# Patient Record
Sex: Female | Born: 1940 | Race: White | Hispanic: No | Smoking: Never smoker
Health system: Southern US, Community
[De-identification: ages and names within clinical notes are randomized; demographics above are authoritative.]

## PROBLEM LIST (undated history)

## (undated) DIAGNOSIS — N184 Chronic kidney disease, stage 4 (severe): Secondary | ICD-10-CM

## (undated) DIAGNOSIS — I340 Nonrheumatic mitral (valve) insufficiency: Secondary | ICD-10-CM

## (undated) DIAGNOSIS — I10 Essential (primary) hypertension: Secondary | ICD-10-CM

## (undated) DIAGNOSIS — M1990 Unspecified osteoarthritis, unspecified site: Secondary | ICD-10-CM

## (undated) DIAGNOSIS — N189 Chronic kidney disease, unspecified: Secondary | ICD-10-CM

## (undated) DIAGNOSIS — E039 Hypothyroidism, unspecified: Secondary | ICD-10-CM

## (undated) HISTORY — DX: Hypothyroidism, unspecified: E03.9

## (undated) HISTORY — DX: Chronic kidney disease, unspecified: N18.9

## (undated) HISTORY — DX: Nonrheumatic mitral (valve) insufficiency: I34.0

## (undated) HISTORY — DX: Essential (primary) hypertension: I10

---

## 1984-05-13 HISTORY — PX: SEPTOPLASTY: SUR1290

## 1988-05-13 HISTORY — PX: TOTAL ABDOMINAL HYSTERECTOMY W/ BILATERAL SALPINGOOPHORECTOMY: SHX83

## 2001-03-03 ENCOUNTER — Other Ambulatory Visit: Admission: RE | Admit: 2001-03-03 | Discharge: 2001-03-03 | Payer: Self-pay | Admitting: Obstetrics and Gynecology

## 2001-07-16 ENCOUNTER — Ambulatory Visit (HOSPITAL_COMMUNITY): Admission: RE | Admit: 2001-07-16 | Discharge: 2001-07-16 | Payer: Self-pay | Admitting: Internal Medicine

## 2003-10-13 ENCOUNTER — Ambulatory Visit (HOSPITAL_COMMUNITY): Admission: RE | Admit: 2003-10-13 | Discharge: 2003-10-13 | Payer: Self-pay | Admitting: Internal Medicine

## 2005-01-24 ENCOUNTER — Ambulatory Visit (HOSPITAL_COMMUNITY): Admission: RE | Admit: 2005-01-24 | Discharge: 2005-01-24 | Payer: Self-pay | Admitting: Internal Medicine

## 2005-01-29 ENCOUNTER — Ambulatory Visit (HOSPITAL_COMMUNITY): Admission: RE | Admit: 2005-01-29 | Discharge: 2005-01-29 | Payer: Self-pay | Admitting: Internal Medicine

## 2008-03-24 ENCOUNTER — Ambulatory Visit (HOSPITAL_COMMUNITY): Admission: RE | Admit: 2008-03-24 | Discharge: 2008-03-24 | Payer: Self-pay | Admitting: Internal Medicine

## 2009-03-22 ENCOUNTER — Ambulatory Visit: Payer: Self-pay | Admitting: Cardiology

## 2009-03-22 DIAGNOSIS — I2789 Other specified pulmonary heart diseases: Secondary | ICD-10-CM | POA: Insufficient documentation

## 2009-03-22 DIAGNOSIS — R011 Cardiac murmur, unspecified: Secondary | ICD-10-CM | POA: Insufficient documentation

## 2009-03-22 DIAGNOSIS — I080 Rheumatic disorders of both mitral and aortic valves: Secondary | ICD-10-CM | POA: Insufficient documentation

## 2009-03-22 DIAGNOSIS — E039 Hypothyroidism, unspecified: Secondary | ICD-10-CM

## 2009-03-22 DIAGNOSIS — I10 Essential (primary) hypertension: Secondary | ICD-10-CM

## 2009-03-28 ENCOUNTER — Ambulatory Visit: Payer: Self-pay | Admitting: Cardiology

## 2009-03-28 ENCOUNTER — Encounter: Payer: Self-pay | Admitting: Cardiology

## 2009-03-28 ENCOUNTER — Ambulatory Visit (HOSPITAL_COMMUNITY): Admission: RE | Admit: 2009-03-28 | Discharge: 2009-03-28 | Payer: Self-pay | Admitting: Nurse Practitioner

## 2009-03-30 ENCOUNTER — Encounter (INDEPENDENT_AMBULATORY_CARE_PROVIDER_SITE_OTHER): Payer: Self-pay | Admitting: *Deleted

## 2009-12-02 ENCOUNTER — Encounter: Payer: Self-pay | Admitting: Orthopedic Surgery

## 2009-12-02 ENCOUNTER — Emergency Department (HOSPITAL_COMMUNITY): Admission: EM | Admit: 2009-12-02 | Discharge: 2009-12-02 | Payer: Self-pay | Admitting: Emergency Medicine

## 2009-12-03 ENCOUNTER — Emergency Department (HOSPITAL_COMMUNITY): Admission: EM | Admit: 2009-12-03 | Discharge: 2009-12-03 | Payer: Self-pay | Admitting: Emergency Medicine

## 2009-12-04 ENCOUNTER — Ambulatory Visit: Payer: Self-pay | Admitting: Orthopedic Surgery

## 2009-12-04 DIAGNOSIS — IMO0002 Reserved for concepts with insufficient information to code with codable children: Secondary | ICD-10-CM

## 2009-12-11 ENCOUNTER — Ambulatory Visit: Payer: Self-pay | Admitting: Orthopedic Surgery

## 2009-12-29 ENCOUNTER — Ambulatory Visit (HOSPITAL_COMMUNITY): Admission: RE | Admit: 2009-12-29 | Discharge: 2009-12-29 | Payer: Self-pay | Admitting: Internal Medicine

## 2010-01-09 ENCOUNTER — Encounter: Payer: Self-pay | Admitting: Internal Medicine

## 2010-01-10 ENCOUNTER — Ambulatory Visit (HOSPITAL_COMMUNITY): Admission: RE | Admit: 2010-01-10 | Discharge: 2010-01-10 | Payer: Self-pay | Admitting: Internal Medicine

## 2010-01-10 ENCOUNTER — Ambulatory Visit: Payer: Self-pay | Admitting: Internal Medicine

## 2010-01-16 ENCOUNTER — Telehealth (INDEPENDENT_AMBULATORY_CARE_PROVIDER_SITE_OTHER): Payer: Self-pay

## 2010-06-12 NOTE — Letter (Signed)
Summary: History form  History form   Imported By: Ruffin Pyo 12/06/2009 08:04:08  _____________________________________________________________________  External Attachment:    Type:   Image     Comment:   External Document

## 2010-06-12 NOTE — Assessment & Plan Note (Signed)
Summary: 1 WK RE-CK RT ARM/HUMANA/CAF   Visit Type:  Follow-up Referring Provider:  ap er Primary Provider:  Erma Pinto, PA-C ([place])  CC:  right arm pain.  History of Present Illness: a 70 year old female had a followup visit after a dog bite to her RIGHT forearm.  Current medication includes Augmentin.  She is doing much better.  She has no pain some mild swelling  She's made significant improvements on Augmentin  Allergies: No Known Drug Allergies  Physical Exam  Extremities:  the swelling has basically resolved she has full range of motion in her hand and wrist there is no lymphangitis or lymphadenopathy.  There is no redness or tenderness   Impression & Recommendations:  Problem # 1:  CELLULITIS AND ABSCESS OF UPPER ARM AND FOREARM (ICD-682.3) Assessment Improved  Orders: Est. Patient Level II [id number])  Patient Instructions: 1)  Please schedule a follow-up appointment as needed.

## 2010-06-12 NOTE — Assessment & Plan Note (Signed)
Summary: AP ER FOL/UP/RT FOREARM INJURY/DOG BITE 12/02/09/XR [facility]/HUMANA...   Vital Signs:  Patient profile:   70 year old female Height:      62 inches Weight:      142 pounds Pulse rate:   78 / minute Resp:     16 per minute  Visit Type:  new patient Referring Provider:  ap er Primary Provider:  Erma Pinto, PA-C [facility])  CC:  right forearm.  History of Present Illness: I saw Sandra Hunter in the office today for an initial visit.  She is a 70 years old woman with the complaint of:  right forearm pain after dog bite on 12/02/09.  Xrays negative of rt forearm on 12/02/09  Meds: Vitamin D, Thyroid.  Received IV Vanc and Unasyn in er.  She was bitten by a dog the dog is been properly cured for an tetanus and proper tears been done in the ER show a gram of Ancef gram of vancomycin took Augmentin 500 one twice a day and is taking ibuprofen 600 for pain she does have some sharp mild constant ache over the RIGHT forearm with some bruising and swelling.  Mild numbness noted as well.    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 No use of tobacco products  3 glasses of wine daily 5 cups of coffee daily ass. degree  Review of Systems Constitutional:  Denies weight loss, weight gain, fever, chills, and fatigue. Cardiovascular:  Denies chest pain, palpitations, fainting, and murmurs. Respiratory:  Complains of short of breath and snoring; denies wheezing, couch, tightness, pain on inspiration, and snoring . Gastrointestinal:  Denies heartburn, nausea, vomiting, diarrhea, constipation, and blood in your stools. Genitourinary:  Denies frequency, urgency, difficulty urinating, painful urination, flank pain, and bleeding in urine. Neurologic:  Denies numbness, tingling, unsteady gait, dizziness, tremors, and seizure. Musculoskeletal:  Denies joint pain,  swelling, instability, stiffness, redness, heat, and muscle pain. Endocrine:  Complains of exessive urination; denies excessive thirst and heat or cold intolerance. Psychiatric:  Complains of depression; denies nervousness, anxiety, and hallucinations. Skin:  Denies changes in the skin, poor healing, rash, itching, and redness. HEENT:  Denies blurred or double vision, eye pain, redness, and watering. Immunology:  Denies seasonal allergies, sinus problems, and allergic to bee stings. Hemoatologic:  Complains of brusing; denies easy bleeding.  Physical Exam  Msk:  The patient is well developed and nourished, with normal grooming and hygiene. The body habitus is  ectomorphic Pulses:  radial pulse Extremities:  she can move her fingers well she can move her wrist while she has some pain in the forearm and tenderness over the forearm elbow is normal. Neurologic:  sensation is normal Skin:  bruising ecchymosis is noted over the RIGHT forearm with multiple to proximal there is some swelling she is moving her fingers quite well there is no adenopathy Axillary Nodes:  no significant adenopathy Psych:  alert and cooperative; normal mood and affect; normal attention span and concentration   Impression & Recommendations:  Problem # 1:  CELLULITIS AND ABSCESS OF UPPER ARM AND FOREARM (ICD-682.3) Assessment New  dog bite RIGHT forearm seems to be doing well on Augmentin continue  The x-rays were done at [facility]. The report and the films have been reviewed.  Orders: New Patient Level II [id number])  Medications Added to Medication List This Visit: 1)  Augmentin 500-125 Mg Tabs (Amoxicillin-pot clavulanate) .Marland KitchenMarland KitchenMarland Kitchen 1  by mouth two times a day  Patient Instructions: 1)  F/U in 1 week 2)  continue antibiotics; elevation; and splinting  Prescriptions: AUGMENTIN 500-125 MG TABS (AMOXICILLIN-POT CLAVULANATE) 1 by mouth two times a day  #28 x 1   Entered and Authorized by:   Arther Abbott MD   Signed by:    Arther Abbott MD on 12/04/2009   Method used:   Print then Give to Patient   RxID:   [id number]

## 2010-06-12 NOTE — Progress Notes (Signed)
Summary: phone note/ ref to bulge in rectum/ref to Dr. Glo Herring  Phone Note Call from Patient   Caller: Patient Summary of Call: Pt called and said that Dr.Rourk referred her to Dr. Glo Herring for Consult for a bulge in her rectum, and she is having pain there. Her appt with Dr. Glo Herring is for Thursday this week. I asked her to call and see if she might be seen there a little earlier. She said she will do so, and will call back if she doesn't get help.  Initial call taken by: Waldon Merl LPN,  September  6, [id number] 8:38 AM

## 2010-06-12 NOTE — Letter (Signed)
Summary: TRIAGE SHEET  TRIAGE SHEET   Imported By: Hoy Morn 01/09/2010 08:49:09  _____________________________________________________________________  External Attachment:    Type:   Image     Comment:   External Document

## 2010-07-28 LAB — DIFFERENTIAL
Basophils Absolute: 0 10*3/uL (ref 0.0–0.1)
Basophils Relative: 1 % (ref 0–1)
Eosinophils Absolute: 0 10*3/uL (ref 0.0–0.7)
Eosinophils Absolute: 0.1 10*3/uL (ref 0.0–0.7)
Eosinophils Relative: 0 % (ref 0–5)
Lymphocytes Relative: 13 % (ref 12–46)
Lymphs Abs: 1.1 10*3/uL (ref 0.7–4.0)
Monocytes Absolute: 0.8 10*3/uL (ref 0.1–1.0)
Monocytes Absolute: 1 10*3/uL (ref 0.1–1.0)
Monocytes Relative: 12 % (ref 3–12)
Monocytes Relative: 9 % (ref 3–12)
Neutro Abs: 6.4 10*3/uL (ref 1.7–7.7)
Neutrophils Relative %: 74 % (ref 43–77)
Neutrophils Relative %: 76 % (ref 43–77)

## 2010-07-28 LAB — BASIC METABOLIC PANEL
Creatinine, Ser: 0.93 mg/dL (ref 0.4–1.2)
GFR calc Af Amer: >60 mL/min (ref >60)
Glucose, Bld: 86 mg/dL (ref 70–99)

## 2010-07-28 LAB — CBC
HCT: 36.9 % (ref 36.0–46.0)
Hemoglobin: 12.7 g/dL (ref 12.0–15.0)
Hemoglobin: 12.8 g/dL (ref 12.0–15.0)
MCH: 33.9 pg (ref 26.0–34.0)
MCHC: 34 g/dL (ref 30.0–36.0)
MCHC: 34.7 g/dL (ref 30.0–36.0)
MCV: 97.6 fL (ref 78.0–100.0)
RBC: 3.78 MIL/uL — ABNORMAL LOW (ref 3.87–5.11)
RBC: 3.78 MIL/uL — ABNORMAL LOW (ref 3.87–5.11)
WBC: 8.7 10*3/uL (ref 4.0–10.5)

## 2011-04-22 ENCOUNTER — Other Ambulatory Visit (HOSPITAL_COMMUNITY): Payer: Self-pay | Admitting: Internal Medicine

## 2011-04-22 DIAGNOSIS — Z139 Encounter for screening, unspecified: Secondary | ICD-10-CM

## 2011-04-25 ENCOUNTER — Ambulatory Visit (HOSPITAL_COMMUNITY)
Admission: RE | Admit: 2011-04-25 | Discharge: 2011-04-25 | Disposition: A | Discharge status: Home / Self Care | Payer: Medicare FFS | Source: Ambulatory Visit | Attending: Internal Medicine | Admitting: Internal Medicine

## 2011-04-25 DIAGNOSIS — Z139 Encounter for screening, unspecified: Secondary | ICD-10-CM

## 2011-04-25 DIAGNOSIS — Z1231 Encounter for screening mammogram for malignant neoplasm of breast: Secondary | ICD-10-CM | POA: Insufficient documentation

## 2011-05-10 ENCOUNTER — Encounter: Payer: Self-pay | Admitting: Cardiology

## 2012-03-17 ENCOUNTER — Other Ambulatory Visit (HOSPITAL_COMMUNITY): Payer: Self-pay | Admitting: Internal Medicine

## 2012-03-17 DIAGNOSIS — Z139 Encounter for screening, unspecified: Secondary | ICD-10-CM

## 2012-04-27 ENCOUNTER — Ambulatory Visit (HOSPITAL_COMMUNITY)
Admission: RE | Admit: 2012-04-27 | Discharge: 2012-04-27 | Disposition: A | Discharge status: Home / Self Care | Payer: Medicare FFS | Source: Ambulatory Visit | Attending: Internal Medicine | Admitting: Internal Medicine

## 2012-04-27 DIAGNOSIS — Z139 Encounter for screening, unspecified: Secondary | ICD-10-CM

## 2012-04-27 DIAGNOSIS — Z1231 Encounter for screening mammogram for malignant neoplasm of breast: Secondary | ICD-10-CM | POA: Insufficient documentation

## 2013-03-29 ENCOUNTER — Other Ambulatory Visit (HOSPITAL_COMMUNITY): Payer: Self-pay | Admitting: Internal Medicine

## 2013-03-29 DIAGNOSIS — Z139 Encounter for screening, unspecified: Secondary | ICD-10-CM

## 2013-04-29 ENCOUNTER — Ambulatory Visit (HOSPITAL_COMMUNITY)
Admission: RE | Admit: 2013-04-29 | Discharge: 2013-04-29 | Disposition: A | Discharge status: Home / Self Care | Payer: Medicare FFS | Source: Ambulatory Visit | Attending: Internal Medicine | Admitting: Internal Medicine

## 2013-04-29 DIAGNOSIS — Z1231 Encounter for screening mammogram for malignant neoplasm of breast: Secondary | ICD-10-CM | POA: Insufficient documentation

## 2013-04-29 DIAGNOSIS — Z139 Encounter for screening, unspecified: Secondary | ICD-10-CM

## 2014-04-27 ENCOUNTER — Other Ambulatory Visit (HOSPITAL_COMMUNITY): Payer: Self-pay | Admitting: Internal Medicine

## 2014-04-27 DIAGNOSIS — Z1231 Encounter for screening mammogram for malignant neoplasm of breast: Secondary | ICD-10-CM

## 2014-05-04 ENCOUNTER — Ambulatory Visit (HOSPITAL_COMMUNITY)
Admission: RE | Admit: 2014-05-04 | Discharge: 2014-05-04 | Disposition: A | Discharge status: Home / Self Care | Payer: Medicare FFS | Source: Ambulatory Visit | Attending: Internal Medicine | Admitting: Internal Medicine

## 2014-05-04 DIAGNOSIS — Z1231 Encounter for screening mammogram for malignant neoplasm of breast: Secondary | ICD-10-CM | POA: Diagnosis present

## 2015-04-19 ENCOUNTER — Other Ambulatory Visit (HOSPITAL_COMMUNITY): Payer: Self-pay | Admitting: Internal Medicine

## 2015-04-19 DIAGNOSIS — Z1231 Encounter for screening mammogram for malignant neoplasm of breast: Secondary | ICD-10-CM

## 2015-05-10 ENCOUNTER — Ambulatory Visit (HOSPITAL_COMMUNITY)
Admission: RE | Admit: 2015-05-10 | Discharge: 2015-05-10 | Disposition: A | Discharge status: Home / Self Care | Payer: Medicare FFS | Source: Ambulatory Visit | Attending: Internal Medicine | Admitting: Internal Medicine

## 2015-05-10 DIAGNOSIS — Z1231 Encounter for screening mammogram for malignant neoplasm of breast: Secondary | ICD-10-CM | POA: Insufficient documentation

## 2016-03-14 ENCOUNTER — Encounter (HOSPITAL_COMMUNITY): Payer: Self-pay | Admitting: Emergency Medicine

## 2016-03-14 ENCOUNTER — Emergency Department (HOSPITAL_COMMUNITY): Payer: Medicare PPO

## 2016-03-14 ENCOUNTER — Emergency Department (HOSPITAL_COMMUNITY)
Admission: EM | Admit: 2016-03-14 | Discharge: 2016-03-14 | Disposition: A | Discharge status: Home / Self Care | Payer: Medicare PPO | Attending: Emergency Medicine | Admitting: Emergency Medicine

## 2016-03-14 DIAGNOSIS — N189 Chronic kidney disease, unspecified: Secondary | ICD-10-CM | POA: Diagnosis not present

## 2016-03-14 DIAGNOSIS — I129 Hypertensive chronic kidney disease with stage 1 through stage 4 chronic kidney disease, or unspecified chronic kidney disease: Secondary | ICD-10-CM | POA: Insufficient documentation

## 2016-03-14 DIAGNOSIS — M79604 Pain in right leg: Secondary | ICD-10-CM

## 2016-03-14 DIAGNOSIS — E039 Hypothyroidism, unspecified: Secondary | ICD-10-CM | POA: Diagnosis not present

## 2016-03-14 DIAGNOSIS — Z791 Long term (current) use of non-steroidal anti-inflammatories (NSAID): Secondary | ICD-10-CM | POA: Diagnosis not present

## 2016-03-14 DIAGNOSIS — Z79899 Other long term (current) drug therapy: Secondary | ICD-10-CM | POA: Diagnosis not present

## 2016-03-14 LAB — URINE MICROSCOPIC-ADD ON

## 2016-03-14 LAB — URINALYSIS, ROUTINE W REFLEX MICROSCOPIC
BILIRUBIN URINE: NEGATIVE
Glucose, UA: NEGATIVE mg/dL
LEUKOCYTES UA: NEGATIVE
NITRITE: NEGATIVE
PROTEIN: 30 mg/dL — AB
Specific Gravity, Urine: 1.02 (ref 1.005–1.030)
pH: 5.5 (ref 5.0–8.0)

## 2016-03-14 MED ORDER — LIDOCAINE 5 % EX PTCH
1.0000 | MEDICATED_PATCH | CUTANEOUS | Status: DC
  Administered 2016-03-14: 1 via TRANSDERMAL
  Filled 2016-03-14 (×2): qty 1

## 2016-03-14 MED ORDER — TRAMADOL HCL 50 MG PO TABS
50.0000 mg | ORAL_TABLET | Freq: Once | ORAL | Status: AC
  Administered 2016-03-14: 50 mg via ORAL
  Filled 2016-03-14: qty 1

## 2016-03-14 MED ORDER — METOPROLOL SUCCINATE ER 50 MG PO TB24
50.0000 mg | ORAL_TABLET | Freq: Every day | ORAL | Status: DC
Start: 2016-03-14 — End: 2016-03-14
  Administered 2016-03-14: 50 mg via ORAL
  Filled 2016-03-14 (×2): qty 1

## 2016-03-14 MED ORDER — MORPHINE SULFATE (PF) 4 MG/ML IV SOLN
4.0000 mg | Freq: Once | INTRAVENOUS | Status: AC
  Administered 2016-03-14: 4 mg via INTRAMUSCULAR
  Filled 2016-03-14: qty 1

## 2016-03-14 MED ORDER — HYDROCODONE-ACETAMINOPHEN 5-325 MG PO TABS
1.0000 | ORAL_TABLET | ORAL | 0 refills | Status: DC | PRN
Start: 2016-03-14 — End: 2018-04-06

## 2016-03-14 MED ORDER — DOCUSATE SODIUM 100 MG PO CAPS: 100.0000 mg | ORAL_CAPSULE | Freq: Two times a day (BID) | ORAL | 0 refills | Status: DC

## 2016-03-14 MED ORDER — LIDOCAINE 5 % EX PTCH: 1.0000 | MEDICATED_PATCH | CUTANEOUS | 0 refills | Status: DC

## 2016-03-14 NOTE — ED Notes (Signed)
Sandra Hunter [phone]   Daughter

## 2016-03-14 NOTE — ED Notes (Signed)
Pt returned from xray

## 2016-03-14 NOTE — Discharge Instructions (Signed)

## 2016-03-14 NOTE — ED Provider Notes (Signed)
Emergency Department Provider Note  By signing my name below, I, Sandra Hunter, attest that this documentation has been prepared under the direction and in the presence of Margette Fast, MD. Electronically Signed: Sonum Hunter, Education administrator. 03/14/16. 1:18 PM.   I have reviewed the triage vital signs and the nursing notes.   HISTORY  Chief Complaint Leg Pain   HPI Comments: Sandra Hunter is a 75 y.o. female who presents to the Emergency Department complaining of constant, unchanged, non-radiating right upper leg pain that began yesterday. He reports that symptoms began during sleep. She denies known injuries or trauma to the affected area. She has taken ibuprofen and ASA without relief. She denies abdominal pain, CP, back pain. 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 Daughter at bedside states that she is typically fairly active. No dysuria. She did not take AM BP medication.   Past Medical History:  Diagnosis Date  . CKD (chronic kidney disease)   . Hypertension   . Hypothyroidism   . Mitral regurgitation     Patient Active Problem List   Diagnosis Date Noted  . CELLULITIS AND ABSCESS OF UPPER ARM AND FOREARM 12/04/2009  . HYPOTHYROIDISM 03/22/2009  . MITRAL REGURGITATION 03/22/2009  . HYPERTENSION 03/22/2009  . 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 UNDIAGNOSED CARDIAC MURMURS 03/22/2009    Past Surgical History:  Procedure Laterality Date  . SEPTOPLASTY  1986  . TOTAL ABDOMINAL HYSTERECTOMY W/ BILATERAL SALPINGOOPHORECTOMY  1990    Current Outpatient Rx  . Order #: [id number] Class: Historical Med  . Order #: [id number] Class: Historical Med  . Order #: [id number] Class: Historical Med  . Order #: [id number] Class: Print  . Order #: [id number] Class: Print  . Order #: [id number] Class: Print    Allergies Review of patient's allergies indicates no known allergies.  Family History  Problem Relation Age of Onset    . Stroke Mother     Social History Social History  Substance Use Topics  . Smoking status: Never Smoker  . Smokeless tobacco: Never Used  . Alcohol use 12.6 oz/week    21 Glasses of wine per week     Comment: 3 glasses of wine daily    Review of Systems Constitutional: No fever/chills Eyes: No visual changes. ENT: No sore throat. Cardiovascular: Denies chest pain. Respiratory: Denies shortness of breath. Gastrointestinal: No abdominal pain.  No nausea, no vomiting.  No diarrhea.  No constipation. Genitourinary: Negative for dysuria. Musculoskeletal: +leg pain Negative for back pain. Skin: Negative for rash. Neurological: Negative for headaches, focal weakness or numbness.  10-point ROS otherwise negative.  ____________________________________________   PHYSICAL EXAM:  VITAL SIGNS: ED Triage Vitals  Enc Vitals Group     BP 03/14/16 1303 (!) 211/109     Pulse Rate 03/14/16 1303 80     Resp 03/14/16 1303 12     Temp 03/14/16 1303 98.1 F (36.7 C)     Temp Source 03/14/16 1303 Oral     SpO2 03/14/16 1303 100 %     Weight 03/14/16 1303 136 lb (61.7 kg)     Height 03/14/16 1303 5\' 3"  (1.6 m)     Pain Score 03/14/16 1301 10   Constitutional: Alert and oriented. Appears intermittently uncomfortable. Eyes: Conjunctivae are normal. Head: Atraumatic. Nose: No congestion/rhinnorhea. Mouth/Throat: Mucous membranes are moist.  Oropharynx non-erythematous. Neck: No stridor.  Cardiovascular: Normal rate, regular rhythm. Good peripheral circulation. Grossly normal heart sounds.   Respiratory: Normal respiratory effort.  No retractions. Lungs CTAB. Gastrointestinal: Soft and nontender. No distention.  Musculoskeletal: No lower extremity tenderness nor edema. No gross deformities of extremities. No ecchymosis of the right thigh or leg. Normal ROM. Normal pulses. No unilateral swelling.  Neurologic:  Normal speech and language. No gross focal neurologic deficits are appreciated.   Skin:  Skin is warm, dry and intact. No rash noted.  ____________________________________________   LABS (all labs ordered are listed, but only abnormal results are displayed)  Labs Reviewed  URINALYSIS, ROUTINE W REFLEX MICROSCOPIC (NOT AT [facility]) - Abnormal; Notable for the following:       Result Value   Hgb urine dipstick TRACE (*)    Ketones, ur TRACE (*)    Protein, ur 30 (*)    All other components within normal limits  URINE MICROSCOPIC-ADD ON - Abnormal; Notable for the following:    Squamous Epithelial / LPF 0-5 (*)    Bacteria, UA RARE (*)    All other components within normal limits    ____________________________________________  RADIOLOGY  Dg Knee 2 Views Right  Result Date: 03/14/2016 CLINICAL DATA:  Pain EXAM: RIGHT KNEE - 1-2 VIEW COMPARISON:  None. FINDINGS: Frontal and lateral views were obtained. There is no fracture or dislocation. No joint effusion. There is mild joint space narrowing in the patellofemoral joint. There is mild patellofemoral and medial compartment spurring. No erosive change. IMPRESSION: Mild osteoarthritic change.  No fracture or joint effusion. Electronically Signed   By: Lowella Grip III M.D.   On: 03/14/2016 13:59   Dg Hip Unilat W Or Wo Pelvis 2-3 Views Right  Result Date: 03/14/2016 CLINICAL DATA:  Pain EXAM: DG HIP (WITH OR WITHOUT PELVIS) 2-3V RIGHT COMPARISON:  None. FINDINGS: Frontal pelvis as well as frontal and lateral right hip images were obtained. No fracture or dislocation. Hip joints appear symmetric and normal bilaterally. Sacroiliac joints appear unremarkable. IMPRESSION: No fracture or dislocation.  No apparent arthropathy. Electronically Signed   By: Lowella Grip III M.D.   On: 03/14/2016 13:59   Dg Femur Min 2 Views Right  Result Date: 03/14/2016 CLINICAL DATA:  Upper thigh region pain for 1 day EXAM: RIGHT FEMUR 2 VIEWS COMPARISON:  None. FINDINGS: Frontal and lateral views were obtained. There is no fracture or  dislocation. Joint spaces appear normal. No abnormal periosteal reaction. IMPRESSION: No abnormality noted. Electronically Signed   By: Lowella Grip III M.D.   On: 03/14/2016 13:58    ____________________________________________   PROCEDURES  Procedure(s) performed:   Procedures  None ____________________________________________   INITIAL IMPRESSION / ASSESSMENT AND PLAN / ED COURSE  Pertinent labs & imaging results that were available during my care of the patient were reviewed by me and considered in my medical decision making (see chart for details).  Patient presents to the emergency department for acute onset right thigh pain. No clear source or inciting event for the pain. She has soft leg compartment with no rash or ecchymosis. Normal pulses distally. Normal range of motion of the hip and knee. No evidence to suggest septic joint. No abdominal pain on exam to suggest referred etiology. Plan for urinalysis and plain films of the lower extremity to rule out fracture.   02:39 PM No obvious source for the patient's pain. No lower back symptoms. No abdominal tenderness. Patient has intact pulses and sensation in the leg. The upper compartment is not tight. There is no overlying rash or ecchymosis. Plan for pain control at home. Lidoderm patch applied in the  emergency department. We'll discharge with small amount of hydrocodone for severe breakthrough pain. Discussed plan with the patient and daughter at bedside in detail. They will call the primary care physician office this afternoon to schedule outpatient follow-up.  At this time, I do not feel there is any life-threatening condition present. I have reviewed and discussed all results (EKG, imaging, lab, urine as appropriate), exam findings with patient. I have reviewed nursing notes and appropriate previous records.  I feel the patient is safe to be discharged home without further emergent workup. Discussed usual and customary  return precautions. Patient and family (if present) verbalize understanding and are comfortable with this plan.  Patient will follow-up with their primary care provider. If they do not have a primary care provider, information for follow-up has been provided to them. All questions have been answered.  ____________________________________________  FINAL CLINICAL IMPRESSION(S) / ED DIAGNOSES  Final diagnoses:  Right leg pain     MEDICATIONS GIVEN DURING THIS VISIT:  Medications  metoprolol succinate (TOPROL-XL) 24 hr tablet 50 mg (50 mg Oral Given 03/14/16 1331)  lidocaine (LIDODERM) 5 % 1 patch (1 patch Transdermal Patch Applied 03/14/16 1329)  traMADol (ULTRAM) tablet 50 mg (50 mg Oral Given 03/14/16 1326)  morphine 4 MG/ML injection 4 mg (4 mg Intramuscular Given 03/14/16 1416)     NEW OUTPATIENT MEDICATIONS STARTED DURING THIS VISIT:  New Prescriptions   DOCUSATE SODIUM (COLACE) 100 MG CAPSULE    Take 1 capsule (100 mg total) by mouth every 12 (twelve) hours.   HYDROCODONE-ACETAMINOPHEN (NORCO/VICODIN) 5-325 MG TABLET    Take 1 tablet by mouth every 4 (four) hours as needed.   LIDOCAINE (LIDODERM) 5 %    Place 1 patch onto the skin daily. Remove & Discard patch within 12 hours or as directed by MD      Note:  This document was prepared using Dragon voice recognition software and may include unintentional dictation errors.  Nanda Quinton, MD Emergency Medicine   I personally performed the services described in this documentation, which was scribed in my presence. The recorded information has been reviewed and is accurate.       Margette Fast, MD 03/14/16 1455

## 2016-03-14 NOTE — ED Triage Notes (Signed)
Pt reports R thigh pain that started approx 0400 today. No known injury.

## 2016-03-14 NOTE — ED Notes (Signed)
Awaiting metoprolol and patch from pharmacy. Pharmacy aware.

## 2016-05-01 ENCOUNTER — Other Ambulatory Visit (HOSPITAL_COMMUNITY): Payer: Self-pay | Admitting: Internal Medicine

## 2016-05-01 DIAGNOSIS — Z1231 Encounter for screening mammogram for malignant neoplasm of breast: Secondary | ICD-10-CM

## 2016-05-08 ENCOUNTER — Ambulatory Visit (HOSPITAL_COMMUNITY)
Admission: RE | Admit: 2016-05-08 | Discharge: 2016-05-08 | Disposition: A | Discharge status: Home / Self Care | Payer: Medicare PPO | Source: Ambulatory Visit | Attending: Internal Medicine | Admitting: Internal Medicine

## 2016-05-08 DIAGNOSIS — Z1231 Encounter for screening mammogram for malignant neoplasm of breast: Secondary | ICD-10-CM | POA: Insufficient documentation

## 2016-10-15 DIAGNOSIS — K623 Rectal prolapse: Secondary | ICD-10-CM | POA: Diagnosis not present

## 2016-10-15 DIAGNOSIS — N182 Chronic kidney disease, stage 2 (mild): Secondary | ICD-10-CM | POA: Diagnosis not present

## 2016-10-29 DIAGNOSIS — K623 Rectal prolapse: Secondary | ICD-10-CM | POA: Diagnosis not present

## 2016-11-25 DIAGNOSIS — I10 Essential (primary) hypertension: Secondary | ICD-10-CM | POA: Diagnosis not present

## 2016-11-25 DIAGNOSIS — N184 Chronic kidney disease, stage 4 (severe): Secondary | ICD-10-CM | POA: Diagnosis not present

## 2016-11-25 DIAGNOSIS — Z79899 Other long term (current) drug therapy: Secondary | ICD-10-CM | POA: Diagnosis not present

## 2016-12-03 ENCOUNTER — Other Ambulatory Visit (HOSPITAL_COMMUNITY): Payer: Self-pay | Admitting: Internal Medicine

## 2016-12-03 DIAGNOSIS — N184 Chronic kidney disease, stage 4 (severe): Secondary | ICD-10-CM

## 2016-12-03 DIAGNOSIS — R7989 Other specified abnormal findings of blood chemistry: Secondary | ICD-10-CM

## 2016-12-06 ENCOUNTER — Ambulatory Visit (HOSPITAL_COMMUNITY)
Admission: RE | Admit: 2016-12-06 | Discharge: 2016-12-06 | Disposition: A | Discharge status: Home / Self Care | Payer: Medicare HMO | Source: Ambulatory Visit | Attending: Internal Medicine | Admitting: Internal Medicine

## 2016-12-06 DIAGNOSIS — N184 Chronic kidney disease, stage 4 (severe): Secondary | ICD-10-CM | POA: Insufficient documentation

## 2016-12-06 DIAGNOSIS — R7989 Other specified abnormal findings of blood chemistry: Secondary | ICD-10-CM

## 2016-12-09 DIAGNOSIS — N184 Chronic kidney disease, stage 4 (severe): Secondary | ICD-10-CM | POA: Diagnosis not present

## 2016-12-09 DIAGNOSIS — Z01810 Encounter for preprocedural cardiovascular examination: Secondary | ICD-10-CM | POA: Diagnosis not present

## 2016-12-09 DIAGNOSIS — I129 Hypertensive chronic kidney disease with stage 1 through stage 4 chronic kidney disease, or unspecified chronic kidney disease: Secondary | ICD-10-CM | POA: Diagnosis not present

## 2016-12-09 DIAGNOSIS — I10 Essential (primary) hypertension: Secondary | ICD-10-CM | POA: Diagnosis not present

## 2016-12-09 DIAGNOSIS — Z5331 Laparoscopic surgical procedure converted to open procedure: Secondary | ICD-10-CM | POA: Diagnosis not present

## 2016-12-09 DIAGNOSIS — K623 Rectal prolapse: Secondary | ICD-10-CM | POA: Diagnosis not present

## 2016-12-09 DIAGNOSIS — Z90710 Acquired absence of both cervix and uterus: Secondary | ICD-10-CM | POA: Diagnosis not present

## 2016-12-16 DIAGNOSIS — Z5331 Laparoscopic surgical procedure converted to open procedure: Secondary | ICD-10-CM | POA: Diagnosis not present

## 2016-12-16 DIAGNOSIS — Z90710 Acquired absence of both cervix and uterus: Secondary | ICD-10-CM | POA: Diagnosis not present

## 2016-12-16 DIAGNOSIS — K623 Rectal prolapse: Secondary | ICD-10-CM | POA: Diagnosis not present

## 2016-12-16 DIAGNOSIS — N184 Chronic kidney disease, stage 4 (severe): Secondary | ICD-10-CM | POA: Diagnosis not present

## 2016-12-16 DIAGNOSIS — I129 Hypertensive chronic kidney disease with stage 1 through stage 4 chronic kidney disease, or unspecified chronic kidney disease: Secondary | ICD-10-CM | POA: Diagnosis not present

## 2016-12-16 DIAGNOSIS — G8918 Other acute postprocedural pain: Secondary | ICD-10-CM | POA: Diagnosis not present

## 2017-01-17 DIAGNOSIS — I10 Essential (primary) hypertension: Secondary | ICD-10-CM | POA: Diagnosis not present

## 2017-01-17 DIAGNOSIS — Z79899 Other long term (current) drug therapy: Secondary | ICD-10-CM | POA: Diagnosis not present

## 2017-01-17 DIAGNOSIS — N184 Chronic kidney disease, stage 4 (severe): Secondary | ICD-10-CM | POA: Diagnosis not present

## 2017-02-19 DIAGNOSIS — I10 Essential (primary) hypertension: Secondary | ICD-10-CM | POA: Diagnosis not present

## 2017-02-19 DIAGNOSIS — R809 Proteinuria, unspecified: Secondary | ICD-10-CM | POA: Diagnosis not present

## 2017-02-19 DIAGNOSIS — M546 Pain in thoracic spine: Secondary | ICD-10-CM | POA: Diagnosis not present

## 2017-02-19 DIAGNOSIS — D631 Anemia in chronic kidney disease: Secondary | ICD-10-CM | POA: Diagnosis not present

## 2017-02-19 DIAGNOSIS — I129 Hypertensive chronic kidney disease with stage 1 through stage 4 chronic kidney disease, or unspecified chronic kidney disease: Secondary | ICD-10-CM | POA: Diagnosis not present

## 2017-02-19 DIAGNOSIS — Z9114 Patient's other noncompliance with medication regimen: Secondary | ICD-10-CM | POA: Diagnosis not present

## 2017-02-19 DIAGNOSIS — Z23 Encounter for immunization: Secondary | ICD-10-CM | POA: Diagnosis not present

## 2017-02-19 DIAGNOSIS — N184 Chronic kidney disease, stage 4 (severe): Secondary | ICD-10-CM | POA: Diagnosis not present

## 2017-02-19 DIAGNOSIS — N2581 Secondary hyperparathyroidism of renal origin: Secondary | ICD-10-CM | POA: Diagnosis not present

## 2017-02-26 ENCOUNTER — Other Ambulatory Visit: Payer: Self-pay | Admitting: Nephrology

## 2017-02-26 DIAGNOSIS — N184 Chronic kidney disease, stage 4 (severe): Secondary | ICD-10-CM

## 2017-03-25 ENCOUNTER — Other Ambulatory Visit (HOSPITAL_COMMUNITY): Payer: Self-pay | Admitting: Nephrology

## 2017-03-25 DIAGNOSIS — N184 Chronic kidney disease, stage 4 (severe): Secondary | ICD-10-CM

## 2017-03-25 DIAGNOSIS — M546 Pain in thoracic spine: Secondary | ICD-10-CM

## 2017-03-25 DIAGNOSIS — R809 Proteinuria, unspecified: Secondary | ICD-10-CM | POA: Diagnosis not present

## 2017-03-31 ENCOUNTER — Ambulatory Visit (HOSPITAL_COMMUNITY)
Admission: RE | Admit: 2017-03-31 | Discharge: 2017-03-31 | Disposition: A | Discharge status: Home / Self Care | Payer: Medicare HMO | Source: Ambulatory Visit | Attending: Nephrology | Admitting: Nephrology

## 2017-03-31 ENCOUNTER — Encounter (HOSPITAL_COMMUNITY): Payer: Self-pay

## 2017-03-31 DIAGNOSIS — M4313 Spondylolisthesis, cervicothoracic region: Secondary | ICD-10-CM | POA: Insufficient documentation

## 2017-03-31 DIAGNOSIS — N184 Chronic kidney disease, stage 4 (severe): Secondary | ICD-10-CM | POA: Diagnosis present

## 2017-03-31 DIAGNOSIS — M546 Pain in thoracic spine: Secondary | ICD-10-CM | POA: Diagnosis present

## 2017-03-31 DIAGNOSIS — M47894 Other spondylosis, thoracic region: Secondary | ICD-10-CM | POA: Insufficient documentation

## 2017-03-31 DIAGNOSIS — M4184 Other forms of scoliosis, thoracic region: Secondary | ICD-10-CM | POA: Diagnosis not present

## 2017-03-31 DIAGNOSIS — M4312 Spondylolisthesis, cervical region: Secondary | ICD-10-CM | POA: Diagnosis not present

## 2017-03-31 DIAGNOSIS — M549 Dorsalgia, unspecified: Secondary | ICD-10-CM | POA: Diagnosis not present

## 2017-04-14 ENCOUNTER — Ambulatory Visit (HOSPITAL_COMMUNITY): Admission: RE | Admit: 2017-04-14 | Payer: Medicare HMO | Source: Ambulatory Visit

## 2017-05-15 ENCOUNTER — Ambulatory Visit (HOSPITAL_COMMUNITY)
Admission: RE | Admit: 2017-05-15 | Discharge: 2017-05-15 | Disposition: A | Discharge status: Home / Self Care | Payer: Medicare HMO | Source: Ambulatory Visit | Attending: Nephrology | Admitting: Nephrology

## 2017-05-15 DIAGNOSIS — N184 Chronic kidney disease, stage 4 (severe): Secondary | ICD-10-CM

## 2017-05-15 DIAGNOSIS — I129 Hypertensive chronic kidney disease with stage 1 through stage 4 chronic kidney disease, or unspecified chronic kidney disease: Secondary | ICD-10-CM | POA: Diagnosis not present

## 2017-05-15 NOTE — Progress Notes (Signed)
*  PRELIMINARY RESULTS* Vascular Ultrasound Renal Artery Duplex has been completed.  There is no obvious evidence of hemodynamically significant renal artery stenosis bilaterally.  05/15/2017 11:10 AM Maudry Mayhew, BS, RVT, RDCS, RDMS

## 2017-07-29 DIAGNOSIS — N184 Chronic kidney disease, stage 4 (severe): Secondary | ICD-10-CM | POA: Diagnosis not present

## 2017-07-29 DIAGNOSIS — D631 Anemia in chronic kidney disease: Secondary | ICD-10-CM | POA: Diagnosis not present

## 2017-07-29 DIAGNOSIS — Z9114 Patient's other noncompliance with medication regimen: Secondary | ICD-10-CM | POA: Diagnosis not present

## 2017-07-29 DIAGNOSIS — I129 Hypertensive chronic kidney disease with stage 1 through stage 4 chronic kidney disease, or unspecified chronic kidney disease: Secondary | ICD-10-CM | POA: Diagnosis not present

## 2017-07-29 DIAGNOSIS — N2581 Secondary hyperparathyroidism of renal origin: Secondary | ICD-10-CM | POA: Diagnosis not present

## 2017-07-29 DIAGNOSIS — R809 Proteinuria, unspecified: Secondary | ICD-10-CM | POA: Diagnosis not present

## 2017-08-18 DIAGNOSIS — N184 Chronic kidney disease, stage 4 (severe): Secondary | ICD-10-CM | POA: Diagnosis not present

## 2017-10-02 DIAGNOSIS — E875 Hyperkalemia: Secondary | ICD-10-CM | POA: Diagnosis not present

## 2018-01-14 DIAGNOSIS — M546 Pain in thoracic spine: Secondary | ICD-10-CM | POA: Diagnosis not present

## 2018-01-14 DIAGNOSIS — R809 Proteinuria, unspecified: Secondary | ICD-10-CM | POA: Diagnosis not present

## 2018-01-14 DIAGNOSIS — D631 Anemia in chronic kidney disease: Secondary | ICD-10-CM | POA: Diagnosis not present

## 2018-01-14 DIAGNOSIS — I129 Hypertensive chronic kidney disease with stage 1 through stage 4 chronic kidney disease, or unspecified chronic kidney disease: Secondary | ICD-10-CM | POA: Diagnosis not present

## 2018-01-14 DIAGNOSIS — Z9114 Patient's other noncompliance with medication regimen: Secondary | ICD-10-CM | POA: Diagnosis not present

## 2018-01-14 DIAGNOSIS — N184 Chronic kidney disease, stage 4 (severe): Secondary | ICD-10-CM | POA: Diagnosis not present

## 2018-01-14 DIAGNOSIS — Z23 Encounter for immunization: Secondary | ICD-10-CM | POA: Diagnosis not present

## 2018-01-14 DIAGNOSIS — N2581 Secondary hyperparathyroidism of renal origin: Secondary | ICD-10-CM | POA: Diagnosis not present

## 2018-01-28 DIAGNOSIS — N184 Chronic kidney disease, stage 4 (severe): Secondary | ICD-10-CM | POA: Diagnosis not present

## 2018-01-28 DIAGNOSIS — I129 Hypertensive chronic kidney disease with stage 1 through stage 4 chronic kidney disease, or unspecified chronic kidney disease: Secondary | ICD-10-CM | POA: Diagnosis not present

## 2018-02-05 DIAGNOSIS — N184 Chronic kidney disease, stage 4 (severe): Secondary | ICD-10-CM | POA: Diagnosis not present

## 2018-02-05 DIAGNOSIS — E039 Hypothyroidism, unspecified: Secondary | ICD-10-CM | POA: Diagnosis not present

## 2018-02-05 DIAGNOSIS — Z23 Encounter for immunization: Secondary | ICD-10-CM | POA: Diagnosis not present

## 2018-02-05 DIAGNOSIS — Z6826 Body mass index (BMI) 26.0-26.9, adult: Secondary | ICD-10-CM | POA: Diagnosis not present

## 2018-02-10 DIAGNOSIS — N184 Chronic kidney disease, stage 4 (severe): Secondary | ICD-10-CM | POA: Diagnosis not present

## 2018-03-11 DIAGNOSIS — N184 Chronic kidney disease, stage 4 (severe): Secondary | ICD-10-CM | POA: Diagnosis not present

## 2018-03-11 DIAGNOSIS — N189 Chronic kidney disease, unspecified: Secondary | ICD-10-CM | POA: Diagnosis not present

## 2018-03-17 DIAGNOSIS — I129 Hypertensive chronic kidney disease with stage 1 through stage 4 chronic kidney disease, or unspecified chronic kidney disease: Secondary | ICD-10-CM | POA: Diagnosis not present

## 2018-03-17 DIAGNOSIS — N184 Chronic kidney disease, stage 4 (severe): Secondary | ICD-10-CM | POA: Diagnosis not present

## 2018-03-17 DIAGNOSIS — D631 Anemia in chronic kidney disease: Secondary | ICD-10-CM | POA: Diagnosis not present

## 2018-03-17 DIAGNOSIS — N390 Urinary tract infection, site not specified: Secondary | ICD-10-CM | POA: Diagnosis not present

## 2018-03-17 DIAGNOSIS — N2581 Secondary hyperparathyroidism of renal origin: Secondary | ICD-10-CM | POA: Diagnosis not present

## 2018-04-03 DIAGNOSIS — N390 Urinary tract infection, site not specified: Secondary | ICD-10-CM | POA: Diagnosis not present

## 2018-04-06 ENCOUNTER — Inpatient Hospital Stay (HOSPITAL_COMMUNITY): Payer: Medicare HMO

## 2018-04-06 ENCOUNTER — Emergency Department (HOSPITAL_COMMUNITY): Payer: Medicare HMO

## 2018-04-06 ENCOUNTER — Encounter (HOSPITAL_COMMUNITY): Payer: Self-pay | Admitting: Emergency Medicine

## 2018-04-06 ENCOUNTER — Inpatient Hospital Stay (HOSPITAL_COMMUNITY)
Admission: EM | Admit: 2018-04-06 | Discharge: 2018-04-16 | DRG: 481 | Disposition: A | Discharge status: Skilled Nursing Facility | Payer: Medicare HMO | Attending: Family Medicine | Admitting: Family Medicine

## 2018-04-06 ENCOUNTER — Other Ambulatory Visit: Payer: Self-pay

## 2018-04-06 DIAGNOSIS — Z90710 Acquired absence of both cervix and uterus: Secondary | ICD-10-CM

## 2018-04-06 DIAGNOSIS — Z823 Family history of stroke: Secondary | ICD-10-CM | POA: Diagnosis not present

## 2018-04-06 DIAGNOSIS — E038 Other specified hypothyroidism: Secondary | ICD-10-CM

## 2018-04-06 DIAGNOSIS — I10 Essential (primary) hypertension: Secondary | ICD-10-CM | POA: Diagnosis not present

## 2018-04-06 DIAGNOSIS — I340 Nonrheumatic mitral (valve) insufficiency: Secondary | ICD-10-CM | POA: Diagnosis present

## 2018-04-06 DIAGNOSIS — K5900 Constipation, unspecified: Secondary | ICD-10-CM | POA: Diagnosis not present

## 2018-04-06 DIAGNOSIS — N2581 Secondary hyperparathyroidism of renal origin: Secondary | ICD-10-CM | POA: Diagnosis present

## 2018-04-06 DIAGNOSIS — E440 Moderate protein-calorie malnutrition: Secondary | ICD-10-CM | POA: Diagnosis not present

## 2018-04-06 DIAGNOSIS — Z79890 Hormone replacement therapy (postmenopausal): Secondary | ICD-10-CM | POA: Diagnosis not present

## 2018-04-06 DIAGNOSIS — N179 Acute kidney failure, unspecified: Secondary | ICD-10-CM | POA: Diagnosis not present

## 2018-04-06 DIAGNOSIS — D62 Acute posthemorrhagic anemia: Secondary | ICD-10-CM | POA: Diagnosis not present

## 2018-04-06 DIAGNOSIS — D631 Anemia in chronic kidney disease: Secondary | ICD-10-CM | POA: Diagnosis present

## 2018-04-06 DIAGNOSIS — S72002P Fracture of unspecified part of neck of left femur, subsequent encounter for closed fracture with malunion: Secondary | ICD-10-CM | POA: Diagnosis not present

## 2018-04-06 DIAGNOSIS — S72009A Fracture of unspecified part of neck of unspecified femur, initial encounter for closed fracture: Secondary | ICD-10-CM | POA: Diagnosis present

## 2018-04-06 DIAGNOSIS — S72002A Fracture of unspecified part of neck of left femur, initial encounter for closed fracture: Secondary | ICD-10-CM

## 2018-04-06 DIAGNOSIS — N184 Chronic kidney disease, stage 4 (severe): Secondary | ICD-10-CM | POA: Diagnosis present

## 2018-04-06 DIAGNOSIS — M898X9 Other specified disorders of bone, unspecified site: Secondary | ICD-10-CM | POA: Diagnosis present

## 2018-04-06 DIAGNOSIS — N183 Chronic kidney disease, stage 3 (moderate): Secondary | ICD-10-CM | POA: Diagnosis not present

## 2018-04-06 DIAGNOSIS — R52 Pain, unspecified: Secondary | ICD-10-CM | POA: Diagnosis not present

## 2018-04-06 DIAGNOSIS — E039 Hypothyroidism, unspecified: Secondary | ICD-10-CM | POA: Diagnosis not present

## 2018-04-06 DIAGNOSIS — Z01818 Encounter for other preprocedural examination: Secondary | ICD-10-CM | POA: Diagnosis not present

## 2018-04-06 DIAGNOSIS — S72142A Displaced intertrochanteric fracture of left femur, initial encounter for closed fracture: Secondary | ICD-10-CM | POA: Diagnosis not present

## 2018-04-06 DIAGNOSIS — M2550 Pain in unspecified joint: Secondary | ICD-10-CM | POA: Diagnosis not present

## 2018-04-06 DIAGNOSIS — S8992XA Unspecified injury of left lower leg, initial encounter: Secondary | ICD-10-CM | POA: Diagnosis not present

## 2018-04-06 DIAGNOSIS — E559 Vitamin D deficiency, unspecified: Secondary | ICD-10-CM | POA: Diagnosis present

## 2018-04-06 DIAGNOSIS — Z981 Arthrodesis status: Secondary | ICD-10-CM | POA: Diagnosis not present

## 2018-04-06 DIAGNOSIS — M1712 Unilateral primary osteoarthritis, left knee: Secondary | ICD-10-CM | POA: Diagnosis not present

## 2018-04-06 DIAGNOSIS — Z90722 Acquired absence of ovaries, bilateral: Secondary | ICD-10-CM | POA: Diagnosis not present

## 2018-04-06 DIAGNOSIS — N189 Chronic kidney disease, unspecified: Secondary | ICD-10-CM | POA: Diagnosis not present

## 2018-04-06 DIAGNOSIS — M79605 Pain in left leg: Secondary | ICD-10-CM | POA: Diagnosis not present

## 2018-04-06 DIAGNOSIS — W010XXA Fall on same level from slipping, tripping and stumbling without subsequent striking against object, initial encounter: Secondary | ICD-10-CM | POA: Diagnosis present

## 2018-04-06 DIAGNOSIS — S72042A Displaced fracture of base of neck of left femur, initial encounter for closed fracture: Secondary | ICD-10-CM | POA: Diagnosis not present

## 2018-04-06 DIAGNOSIS — M80852A Other osteoporosis with current pathological fracture, left femur, initial encounter for fracture: Principal | ICD-10-CM | POA: Diagnosis present

## 2018-04-06 DIAGNOSIS — Z7401 Bed confinement status: Secondary | ICD-10-CM | POA: Diagnosis not present

## 2018-04-06 DIAGNOSIS — Y92019 Unspecified place in single-family (private) house as the place of occurrence of the external cause: Secondary | ICD-10-CM | POA: Diagnosis not present

## 2018-04-06 DIAGNOSIS — I129 Hypertensive chronic kidney disease with stage 1 through stage 4 chronic kidney disease, or unspecified chronic kidney disease: Secondary | ICD-10-CM | POA: Diagnosis not present

## 2018-04-06 DIAGNOSIS — W19XXXA Unspecified fall, initial encounter: Secondary | ICD-10-CM | POA: Diagnosis not present

## 2018-04-06 DIAGNOSIS — T148XXA Other injury of unspecified body region, initial encounter: Secondary | ICD-10-CM

## 2018-04-06 DIAGNOSIS — R5381 Other malaise: Secondary | ICD-10-CM | POA: Diagnosis not present

## 2018-04-06 HISTORY — DX: Chronic kidney disease, stage 4 (severe): N18.4

## 2018-04-06 LAB — BASIC METABOLIC PANEL
Anion gap: 12 (ref 5–15)
BUN: 30 mg/dL — ABNORMAL HIGH (ref 8–23)
CO2: 21 mmol/L — ABNORMAL LOW (ref 22–32)
Calcium: 9.4 mg/dL (ref 8.9–10.3)
Chloride: 103 mmol/L (ref 98–111)
Creatinine, Ser: 1.77 mg/dL — ABNORMAL HIGH (ref 0.44–1.00)
GFR calc Af Amer: 31 mL/min — ABNORMAL LOW (ref >60)
GFR calc non Af Amer: 27 mL/min — ABNORMAL LOW (ref >60)
Glucose, Bld: 98 mg/dL (ref 70–99)
Potassium: 4.7 mmol/L (ref 3.5–5.1)
Sodium: 136 mmol/L (ref 135–145)

## 2018-04-06 LAB — CBC WITH DIFFERENTIAL/PLATELET
Abs Immature Granulocytes: 0.02 10*3/uL (ref 0.00–0.07)
Basophils Absolute: 0 10*3/uL (ref 0.0–0.1)
Basophils Relative: 0 %
Eosinophils Absolute: 0 10*3/uL (ref 0.0–0.5)
Eosinophils Relative: 1 %
HCT: 33.5 % — ABNORMAL LOW (ref 36.0–46.0)
Hemoglobin: 10.9 g/dL — ABNORMAL LOW (ref 12.0–15.0)
Immature Granulocytes: 0 %
Lymphocytes Relative: 6 %
Lymphs Abs: 0.5 10*3/uL — ABNORMAL LOW (ref 0.7–4.0)
MCH: 31.3 pg (ref 26.0–34.0)
MCHC: 32.5 g/dL (ref 30.0–36.0)
MCV: 96.3 fL (ref 80.0–100.0)
Monocytes Absolute: 0.9 10*3/uL (ref 0.1–1.0)
Monocytes Relative: 11 %
Neutro Abs: 7.1 10*3/uL (ref 1.7–7.7)
Neutrophils Relative %: 82 %
Platelets: 370 10*3/uL (ref 150–400)
RBC: 3.48 MIL/uL — ABNORMAL LOW (ref 3.87–5.11)
RDW: 12.8 % (ref 11.5–15.5)
WBC: 8.7 10*3/uL (ref 4.0–10.5)
nRBC: 0 % (ref 0.0–0.2)

## 2018-04-06 LAB — ABO/RH: ABO/RH(D): A POS

## 2018-04-06 LAB — ETHANOL

## 2018-04-06 MED ORDER — ENOXAPARIN SODIUM 30 MG/0.3ML ~~LOC~~ SOLN
30.0000 mg | SUBCUTANEOUS | Status: DC
  Administered 2018-04-08 – 2018-04-16 (×9): 30 mg via SUBCUTANEOUS
  Filled 2018-04-06 (×9): qty 0.3

## 2018-04-06 MED ORDER — LORAZEPAM 2 MG/ML IJ SOLN
0.5000 mg | Freq: Once | INTRAMUSCULAR | Status: AC
  Administered 2018-04-06: 0.5 mg via INTRAVENOUS
  Filled 2018-04-06: qty 1

## 2018-04-06 MED ORDER — FUROSEMIDE 20 MG PO TABS
20.0000 mg | ORAL_TABLET | Freq: Every day | ORAL | Status: DC
  Administered 2018-04-08 – 2018-04-09 (×2): 20 mg via ORAL
  Filled 2018-04-06 (×2): qty 1

## 2018-04-06 MED ORDER — ONDANSETRON HCL 4 MG/2ML IJ SOLN: 4.0000 mg | Freq: Four times a day (QID) | INTRAMUSCULAR | Status: DC | PRN

## 2018-04-06 MED ORDER — LORAZEPAM 2 MG/ML IJ SOLN
0.0000 mg | Freq: Two times a day (BID) | INTRAMUSCULAR | Status: AC
  Administered 2018-04-08: 1 mg via INTRAVENOUS
  Administered 2018-04-10: 2 mg via INTRAVENOUS
  Filled 2018-04-06 (×2): qty 1

## 2018-04-06 MED ORDER — HYDROCODONE-ACETAMINOPHEN 5-325 MG PO TABS
1.0000 | ORAL_TABLET | Freq: Four times a day (QID) | ORAL | Status: DC | PRN
  Administered 2018-04-06 – 2018-04-10 (×8): 2 via ORAL
  Filled 2018-04-06 (×8): qty 2

## 2018-04-06 MED ORDER — MORPHINE SULFATE (PF) 4 MG/ML IV SOLN
6.0000 mg | Freq: Once | INTRAVENOUS | Status: AC
  Administered 2018-04-06: 6 mg via INTRAVENOUS
  Filled 2018-04-06: qty 2

## 2018-04-06 MED ORDER — LORAZEPAM 1 MG PO TABS: 1.0000 mg | ORAL_TABLET | Freq: Four times a day (QID) | ORAL | Status: AC | PRN

## 2018-04-06 MED ORDER — HYDRALAZINE HCL 20 MG/ML IJ SOLN
10.0000 mg | Freq: Four times a day (QID) | INTRAMUSCULAR | Status: DC | PRN
  Filled 2018-04-06 (×2): qty 1

## 2018-04-06 MED ORDER — LEVOTHYROXINE SODIUM 50 MCG PO TABS: 50.0000 ug | ORAL_TABLET | Freq: Every day | ORAL | Status: DC

## 2018-04-06 MED ORDER — AMLODIPINE BESYLATE 10 MG PO TABS
10.0000 mg | ORAL_TABLET | Freq: Every day | ORAL | Status: DC
  Administered 2018-04-08 – 2018-04-16 (×9): 10 mg via ORAL
  Filled 2018-04-06 (×9): qty 1

## 2018-04-06 MED ORDER — FOLIC ACID 1 MG PO TABS
1.0000 mg | ORAL_TABLET | Freq: Every day | ORAL | Status: DC
  Administered 2018-04-08 – 2018-04-16 (×9): 1 mg via ORAL
  Filled 2018-04-06 (×9): qty 1

## 2018-04-06 MED ORDER — MORPHINE SULFATE (PF) 4 MG/ML IV SOLN
4.0000 mg | Freq: Once | INTRAVENOUS | Status: AC
  Administered 2018-04-06: 4 mg via INTRAVENOUS
  Filled 2018-04-06: qty 1

## 2018-04-06 MED ORDER — HYDRALAZINE HCL 20 MG/ML IJ SOLN
10.0000 mg | Freq: Once | INTRAMUSCULAR | Status: AC
  Administered 2018-04-06: 10 mg via INTRAVENOUS
  Filled 2018-04-06: qty 1

## 2018-04-06 MED ORDER — SODIUM CHLORIDE 0.9 % IV SOLN
INTRAVENOUS | Status: DC
  Administered 2018-04-06 – 2018-04-08 (×3): via INTRAVENOUS

## 2018-04-06 MED ORDER — LORAZEPAM 2 MG/ML IJ SOLN
0.0000 mg | Freq: Four times a day (QID) | INTRAMUSCULAR | Status: AC
  Administered 2018-04-07 (×2): 2 mg via INTRAVENOUS
  Filled 2018-04-06 (×2): qty 1

## 2018-04-06 MED ORDER — CEFAZOLIN SODIUM-DEXTROSE 2-4 GM/100ML-% IV SOLN
2.0000 g | INTRAVENOUS | Status: AC
  Administered 2018-04-07: 2 g via INTRAVENOUS
  Filled 2018-04-06: qty 100

## 2018-04-06 MED ORDER — MORPHINE SULFATE (PF) 2 MG/ML IV SOLN
0.5000 mg | INTRAVENOUS | Status: DC | PRN
  Administered 2018-04-06 – 2018-04-09 (×9): 0.5 mg via INTRAVENOUS
  Filled 2018-04-06 (×10): qty 1

## 2018-04-06 MED ORDER — THIAMINE HCL 100 MG/ML IJ SOLN
100.0000 mg | Freq: Every day | INTRAMUSCULAR | Status: DC
  Filled 2018-04-06: qty 2

## 2018-04-06 MED ORDER — LORAZEPAM 2 MG/ML IJ SOLN: 1.0000 mg | Freq: Four times a day (QID) | INTRAMUSCULAR | Status: AC | PRN

## 2018-04-06 MED ORDER — ADULT MULTIVITAMIN W/MINERALS CH
1.0000 | ORAL_TABLET | Freq: Every day | ORAL | Status: DC
  Administered 2018-04-08 – 2018-04-16 (×9): 1 via ORAL
  Filled 2018-04-06 (×9): qty 1

## 2018-04-06 MED ORDER — AMOXICILLIN 500 MG PO CAPS
500.0000 mg | ORAL_CAPSULE | Freq: Three times a day (TID) | ORAL | Status: DC
  Administered 2018-04-06 – 2018-04-07 (×2): 500 mg via ORAL
  Filled 2018-04-06 (×3): qty 1

## 2018-04-06 MED ORDER — VITAMIN B-1 100 MG PO TABS
100.0000 mg | ORAL_TABLET | Freq: Every day | ORAL | Status: DC
  Administered 2018-04-08 – 2018-04-16 (×9): 100 mg via ORAL
  Filled 2018-04-06 (×9): qty 1

## 2018-04-06 NOTE — H&P (Signed)
History and Physical  [name]:[id number] DOB: 1941/01/30 DOA: 04/06/2018   PCP: [name], MD   Patient coming from: Home  Chief Complaint: left hip pain after fall  HPI:  Sandra Hunter is a 77 y.o. female with medical history of essential hypertension, CKD stage IV, hypothyroidism, rectal prolapse presenting after mechanical fall on 04/05/2018 in the early evening.  The patient tripped over something on her floor on the way to the kitchen onto her left side.  She denied any syncope.  The patient had a caregiver that helped her back to the couch where she laid for the rest of the evening.  The patient felt that the pain may have improved overnight.  However in the morning of 04/06/2018, the patient still has significant pain and was not able to bear weight.  The result, EMS was activated.  Patient denies fevers, chills, headache, chest pain, dyspnea, nausea, vomiting, diarrhea, abdominal pain, dysuria, hematuria, hematochezia, and melena.  Notably, the patient states that she has been on amoxicillin for prophylaxis secondary to recent dental implant.  She denies any infection in her tooth.  In the emergency department, the patient was afebrile hemodynamically stable saturating 100% on room air.  BMP was unremarkable with her serum creatinine at baseline.  CBC was unremarkable with hemoglobin at baseline.  Chest x-ray was negative for any infiltrates or edema.  X-ray of the left pelvis showed a proximal left femoral neck fracture.  EKG shows sinus rhythm with nonspecific T wave changes.  Assessment/Plan: Left proximal femoral neck fracture -Secondary to mechanical fall -Orthopedic surgery, Dr. Marcelino Scot consulted -transfer to [facility] for surgical repair -pt is medically stable for surgery presently -npo for now -PT/OT after surgery  Essential hypertension -Continue amlodipine -However, patient did not take her amlodipine this morning -Hydralazine PRN SBP  >180  Hypothyroidism -Continue Synthroid  CKD stage III -Baseline creatinine 1.8-2.1 -A.m. BMP        Past Medical History:  Diagnosis Date  . CKD (chronic kidney disease)   . Hypertension   . Hypothyroidism   . Mitral regurgitation    Past Surgical History:  Procedure Laterality Date  . SEPTOPLASTY  1986  . TOTAL ABDOMINAL HYSTERECTOMY W/ BILATERAL SALPINGOOPHORECTOMY  1990   Social History:  reports that she has never smoked. She has never used smokeless tobacco. She reports that she drinks about 21.0 standard drinks of alcohol per week. She reports that she does not use drugs.   Family History  Problem Relation Age of Onset  . Stroke Mother      No Known Allergies   Prior to Admission medications   Medication Sig Start Date End Date Taking? Authorizing Provider  amLODipine (NORVASC) 10 MG tablet Take 10 mg by mouth daily. 03/17/18  Yes [provider]  amoxicillin (AMOXIL) 500 MG capsule TAKE 1 CAPSULE BY MOUTH THREE TIMES DAILY UNTIL GONE 03/24/18  Yes [provider]  chlorhexidine (PERIDEX) 0.12 % solution  03/03/18  Yes [provider]  furosemide (LASIX) 20 MG tablet TAKE 1 TABLET BY MOUTH ONCE DAILY IN THE MORNING 01/14/18  Yes [provider]  ibuprofen (ADVIL,MOTRIN) 400 MG tablet Take 400 mg by mouth every 6 (six) hours as needed.     Yes [provider]  levothyroxine (SYNTHROID, LEVOTHROID) 50 MCG tablet Take 50 mcg by mouth daily. 02/06/18  Yes [provider]    Review of Systems:  Constitutional:  No weight loss, night sweats, Fevers,  chills, fatigue.  Head&Eyes: No headache.  No vision loss.  No eye pain or scotoma ENT:  No Difficulty swallowing,Tooth/dental problems,Sore throat,  No ear ache, post nasal drip,  Cardio-vascular:  No chest pain, Orthopnea, PND, swelling in lower extremities,  dizziness, palpitations  GI:  No  abdominal pain, nausea, vomiting, diarrhea, loss of appetite,  hematochezia, melena, heartburn, indigestion, Resp:  No shortness of breath with exertion or at rest. No cough. No coughing up of blood .No wheezing.No chest wall deformity  Skin:  no rash or lesions.  GU:  no dysuria, change in color of urine, no urgency or frequency. No flank pain.  Musculoskeletal:  Left hip pain and edema. Psych:  No change in mood or affect. No depression or anxiety. Neurologic: No headache, no dysesthesia, no focal weakness, no vision loss. No syncope  Physical Exam: Vitals:   04/06/18 1245 04/06/18 1400 04/06/18 1403 04/06/18 1415  BP: (!) 173/77 (!) 161/103    Pulse:   82 83  Resp: 18     Temp:      TempSrc:      SpO2:   97% 97%  Weight:      Height:       General:  A&O x 3, NAD, nontoxic, pleasant/cooperative Head/Eye: No conjunctival hemorrhage, no icterus, [place]/AT, No nystagmus ENT:  No icterus,  No thrush, good dentition, no pharyngeal exudate Neck:  No masses, no lymphadenpathy, no bruits CV:  RRR, no rub, no gallop, no S3 Lung:  CTAB, good air movement, no wheeze, no rhonchi Abdomen: soft/NT, +BS, nondistended, no peritoneal signs Ext: No cyanosis, No rashes, No petechiae, No lymphangitis, trace lower extremity edema   Labs on Admission:  Basic Metabolic Panel: Recent Labs  Lab 04/06/18 1208  NA 136  K 4.7  CL 103  CO2 21*  GLUCOSE 98  BUN 30*  CREATININE 1.77*  CALCIUM 9.4   Liver Function Tests: No results for input(s): AST, ALT, ALKPHOS, BILITOT, PROT, ALBUMIN in the last 168 hours. No results for input(s): LIPASE, AMYLASE in the last 168 hours. No results for input(s): AMMONIA in the last 168 hours. CBC: Recent Labs  Lab 04/06/18 1208  WBC 8.7  NEUTROABS 7.1  HGB 10.9*  HCT 33.5*  MCV 96.3  PLT 370   Coagulation Profile: No results for input(s): INR, PROTIME in the last 168 hours. Cardiac Enzymes: No results for input(s): CKTOTAL, CKMB, CKMBINDEX, TROPONINI in the last 168 hours. BNP: Invalid input(s):  POCBNP CBG: No results for input(s): GLUCAP in the last 168 hours. Urine analysis:    Component Value Date/Time   COLORURINE YELLOW 03/14/2016 1318   APPEARANCEUR CLEAR 03/14/2016 1318   LABSPEC 1.020 03/14/2016 1318   PHURINE 5.5 03/14/2016 1318   GLUCOSEU NEGATIVE 03/14/2016 1318   HGBUR TRACE (A) 03/14/2016 1318   BILIRUBINUR NEGATIVE 03/14/2016 1318   KETONESUR TRACE (A) 03/14/2016 1318   PROTEINUR 30 (A) 03/14/2016 1318   NITRITE NEGATIVE 03/14/2016 1318   LEUKOCYTESUR NEGATIVE 03/14/2016 1318   Sepsis Labs: @LABRCNTIP (procalcitonin:4,lacticidven:4) )No results found for this or any previous visit (from the past 240 hour(s)).   Radiological Exams on Admission: Dg Chest 1 View  Result Date: 04/06/2018 CLINICAL DATA:  Pre operative respiratory exam.  Left hip fracture. EXAM: CHEST  1 VIEW COMPARISON:  Thoracic radiographs dated 12/29/2009 FINDINGS: The heart size and pulmonary vascularity are normal. Lungs are clear. Tortuosity of the brachiocephalic vessels. Chronic lobulation of the right hemidiaphragm. Thoracolumbar scoliosis, unchanged. IMPRESSION: No acute abnormality of the  chest. Electronically Signed   By: Lorriane Shire M.D.   On: 04/06/2018 13:20   Dg Hip Unilat W Or Wo Pelvis 2-3 Views Left  Result Date: 04/06/2018 CLINICAL DATA:  Left leg pain after a fall yesterday. Leg deformity. EXAM: DG HIP (WITH OR WITHOUT PELVIS) 2-3V LEFT COMPARISON:  None. FINDINGS: There is an angulated low neck fracture of the proximal left femur which appears to involve the greater trochanter. Pelvic bones are intact. IMPRESSION: Proximal left femur fracture as described. Electronically Signed   By: Lorriane Shire M.D.   On: 04/06/2018 13:18    EKG: Independently reviewed. Sinus, nonspecific T-wave changes    Time spent:60 minutes Code Status:   FULL Family Communication:  No Family at bedside Disposition Plan: expect 2-3 day hospitalization Consults called: Ortho--Dr. Marcelino Scot DVT  Prophylaxis: [place] Lovenox  Orson Eva, DO  [facility] Pager [phone]  If 7PM-7AM, please contact night-coverage [email or web address] Password [facility] 04/06/2018, 2:29 PM

## 2018-04-06 NOTE — Progress Notes (Signed)
Orthopedic Tech Progress Note Patient Details:  Sandra Hunter 1940/07/09 [id number]      Post Interventions Patient Tolerated: Well Instructions Provided: Adjustment of device, Care of device   Melony Overly T 04/06/2018, 8:00 PM

## 2018-04-06 NOTE — Progress Notes (Signed)
Orthopaedic Trauma service   OTS aware of pt and a left basicervical femoral neck fracture  Patient is to be transferred to [facility] for definitive fixation.  Based off current x-rays it looks as if fracture can be fixed using a cephalomedullary device.  I would like to place the patient in Buck's traction and repeat hip x-rays.  Lateral x-ray should be a crosstable lateral.  This can be done portable.  As patient is still at [facility] at this time we will plan on proceeding with surgery tomorrow afternoon.  Currently our third case.  This may change.  Patient can eat for now   N.p.o. after midnight  Preoperative labs  Consent for intramedullary nailing left femur versus left hip hemiarthroplasty if fracture felt to be more transcervical in nature.  Jari Pigg, PA-C [phone] (C) 04/06/2018, 5:21 PM  [facility] [place] [address] [phone] (F)

## 2018-04-06 NOTE — ED Provider Notes (Signed)
[facility] EMERGENCY DEPARTMENT Provider Note   CSN: [id number] Arrival date & time: 04/06/18  1128     History   Chief Complaint Chief Complaint  Patient presents with  . Fall    HPI Sandra Hunter is a 77 y.o. female.  HPI   77 year old female with left hip pain.  She had a mechanical fall yesterday.  She states that she lost her balance.  She was assisted up to a couch she has hope that the pain would improve.  Presented today with her symptoms not feels significantly better.  She did hit her head.  She did not lose consciousness.  She denies any significant acute headache, neck or back pain.  She is not anticoagulated.  Past Medical History:  Diagnosis Date  . CKD (chronic kidney disease)   . Hypertension   . Hypothyroidism   . Mitral regurgitation     Patient Active Problem List   Diagnosis Date Noted  . CELLULITIS AND ABSCESS OF UPPER ARM AND FOREARM 12/04/2009  . HYPOTHYROIDISM 03/22/2009  . MITRAL REGURGITATION 03/22/2009  . HYPERTENSION 03/22/2009  . 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 UNDIAGNOSED CARDIAC MURMURS 03/22/2009    Past Surgical History:  Procedure Laterality Date  . SEPTOPLASTY  1986  . TOTAL ABDOMINAL HYSTERECTOMY W/ BILATERAL SALPINGOOPHORECTOMY  1990     OB History    Gravida  2   Para  2   Term  2   Preterm      AB      Living  2     SAB      TAB      Ectopic      Multiple      Live Births               Home Medications    Prior to Admission medications   Medication Sig Start Date End Date Taking? Authorizing Provider  amLODipine (NORVASC) 10 MG tablet Take 10 mg by mouth daily. 03/17/18  Yes [provider]  amoxicillin (AMOXIL) 500 MG capsule TAKE 1 CAPSULE BY MOUTH THREE TIMES DAILY UNTIL GONE 03/24/18  Yes [provider]  chlorhexidine (PERIDEX) 0.12 % solution  03/03/18  Yes [provider]  furosemide (LASIX) 20 MG tablet TAKE 1 TABLET BY MOUTH ONCE DAILY IN THE  MORNING 01/14/18  Yes [provider]  ibuprofen (ADVIL,MOTRIN) 400 MG tablet Take 400 mg by mouth every 6 (six) hours as needed.     Yes [provider]  levothyroxine (SYNTHROID, LEVOTHROID) 50 MCG tablet Take 50 mcg by mouth daily. 02/06/18  Yes [provider]    Family History Family History  Problem Relation Age of Onset  . Stroke Mother     Social History Social History   Tobacco Use  . Smoking status: Never Smoker  . Smokeless tobacco: Never Used  Substance Use Topics  . Alcohol use: Yes    Alcohol/week: 21.0 standard drinks    Types: 21 Glasses of wine per week    Comment: 3 glasses of wine daily  . Drug use: No     Allergies   Patient has no known allergies.   Review of Systems Review of Systems  All systems reviewed and negative, other than as noted in HPI.  Physical Exam Updated Vital Signs BP (!) 173/77   Pulse 87   Temp 97.6 F (36.4 C) (Oral)   Resp 18   Ht 5\' 3"  (1.6 m)   Wt 60.8 kg  SpO2 99%   BMI 23.74 kg/m    Physical Exam  Constitutional: She appears well-developed and well-nourished. No distress.  HENT:  Head: Normocephalic.  Faint ecchymosis L forehead. No significant tenderness. No midline spinal tenderness.   Eyes: Conjunctivae are normal. Right eye exhibits no discharge. Left eye exhibits no discharge.  Neck: Neck supple.  Cardiovascular: Normal rate, regular rhythm and normal heart sounds. Exam reveals no gallop and no friction rub.  No murmur heard. Pulmonary/Chest: Effort normal and breath sounds normal. No respiratory distress.  Abdominal: Soft. She exhibits no distension. There is no tenderness.  Musculoskeletal: She exhibits no edema or tenderness.  Left lower extremity does not seem shortened but it is externally rotated.  Severe pain with attempted range of motion of the left hip.  Palpable DP pulse.  Sensation is intact to light touch.  Neurological: She is alert.  Skin: Skin is warm and dry.    Psychiatric: She has a normal mood and affect. Her behavior is normal. Thought content normal.  Nursing note and vitals reviewed.    ED Treatments / Results  Labs (all labs ordered are listed, but only abnormal results are displayed) Labs Reviewed  BASIC METABOLIC PANEL - Abnormal; Notable for the following components:      Result Value   CO2 21 (*)    BUN 30 (*)    Creatinine, Ser 1.77 (*)    GFR calc non Af Amer 27 (*)    GFR calc Af Amer 31 (*)    All other components within normal limits  CBC WITH DIFFERENTIAL/PLATELET - Abnormal; Notable for the following components:   RBC 3.48 (*)    Hemoglobin 10.9 (*)    HCT 33.5 (*)    Lymphs Abs 0.5 (*)    All other components within normal limits  URINALYSIS, ROUTINE W REFLEX MICROSCOPIC    EKG EKG Interpretation  Date/Time:  Monday April 06 2018 12:44:06 EST Ventricular Rate:  91 PR Interval:    QRS Duration: 94 QT Interval:  369 QTC Calculation: 454 R Axis:   -9 Text Interpretation:  Sinus rhythm Abnormal R-wave progression, early transition LVH with secondary repolarization abnormality Baseline wander in lead(s) V1 Confirmed by Virgel Manifold [phone]) on 04/06/2018 1:05:52 PM   Radiology Dg Chest 1 View  Result Date: 04/06/2018 CLINICAL DATA:  Pre operative respiratory exam.  Left hip fracture. EXAM: CHEST  1 VIEW COMPARISON:  Thoracic radiographs dated 12/29/2009 FINDINGS: The heart size and pulmonary vascularity are normal. Lungs are clear. Tortuosity of the brachiocephalic vessels. Chronic lobulation of the right hemidiaphragm. Thoracolumbar scoliosis, unchanged. IMPRESSION: No acute abnormality of the chest. Electronically Signed   By: Lorriane Shire M.D.   On: 04/06/2018 13:20   Dg Hip Unilat W Or Wo Pelvis 2-3 Views Left  Result Date: 04/06/2018 CLINICAL DATA:  Left leg pain after a fall yesterday. Leg deformity. EXAM: DG HIP (WITH OR WITHOUT PELVIS) 2-3V LEFT COMPARISON:  None. FINDINGS: There is an angulated  low neck fracture of the proximal left femur which appears to involve the greater trochanter. Pelvic bones are intact. IMPRESSION: Proximal left femur fracture as described. Electronically Signed   By: Lorriane Shire M.D.   On: 04/06/2018 13:18    Procedures Procedures (including critical care time)  Medications Ordered in ED Medications  0.9 %  sodium chloride infusion ( Intravenous New Bag/Given 04/06/18 1238)  morphine 4 MG/ML injection 6 mg (6 mg Intravenous Given 04/06/18 1239)     Initial Impression / Assessment  and Plan / ED Course  I have reviewed the triage vital signs and the nursing notes.  Pertinent labs & imaging results that were available during my care of the patient were reviewed by me and considered in my medical decision making (see chart for details).     77 year old female with a left hip fracture after mechanical fall.  Will check basic labs, Chest x-ray and EKG.  Will discuss with orthopedic surgery.  Medical admission.  1:37 PM Discussed with Dr Marcelino Scot, orthopedic surgery. Transfer to [facility]. Requesting that she stay NPO because she may potentially have surgery tonight. Pt updated. Hospitalist consulted.   Final Clinical Impressions(s) / ED Diagnoses   Final diagnoses:  Closed fracture of left hip, initial encounter [facility])    ED Discharge Orders    None      Virgel Manifold, MD 04/06/18 1340

## 2018-04-06 NOTE — ED Triage Notes (Signed)
PT brought in by [facility] today for c/o fall in her kitchen yesterday. PT states she tripped and fell onto her left knee yesterday. PT arrives to ED today incontinent of urine and bowel movement and states she couldn't get up due to pain to her left leg. PT states recent completion of Keflex antibiotic for UTI. Left leg noted to be externally rotated with swelling at the hip.

## 2018-04-06 NOTE — Consult Note (Addendum)
Orthopaedic Trauma Service (OTS) Consult   Patient ID: CAMELLE HENKELS MRN: [id number] DOB/AGE: [birth year] 77 y.o.   Reason for Consult: Left proximal femur fracture Referring Physician: Orson Eva, MD    HPI: MACON LESESNE is an 39 y.o. white female with a history of hypertension (77 y.o.), thyroid dysfunction, CKD who sustained a ground-level fall yesterday a early afternoon.  Patient states that she thinks she tripped over a broom in the kitchen.  She did not initially seek medical attention however over the ensuing hours she was unable to bear weight and had increasing pain.  She presented to [facility] earlier today where she was found to have a left proximal femur fracture.  Orthopedics at [facility] were contacted for consultation.  Patient was then transferred down to [facility] hospital for definitive management.  Patient has been seen and evaluated by the hospitalist service and has been admitted to the hospitalist service.  Patient's only complaint is left hip pain.  She denies any pain elsewhere.  No left knee pain no pain in her ankle or foot.  No right lower extremity injuries noted.  No injury to her upper extremities.  Patient had a dental implants placed about 3 months ago and has been on amoxicillin prophylactically due to those implants.  No active dental abscesses or other infections are noted.    Patient lives with her husband who she states is bedridden due to advanced dementia.  He is a diabetic and is on insulin therapy.  She states that she is his primary provider (77 y.o.) and addresses all of his medication needs.  She administers his insulin.  Patient states that for the time being her daughter and a caregiver are caring for the patient at their house.  Patient does not take any vitamin D or calcium.  No other medications for bone health.  States that her last bone density scan may have been upwards of 10 years ago.  The most recent bone density scan I see in the  computer system is from 2016.  Her hip T score was -2.6 which is in the osteoporotic range.  Patient ambulates independently prior to her fall.  No assistive devices.   Patient does not smoke and never has (77 y.o.)   States that she drinks 3 glasses of wine a day.  States that she has never gone through alcohol withdrawal  Past Medical History:  Diagnosis Date  . CKD (chronic kidney disease)   . Hypertension   . Hypothyroidism   . Mitral regurgitation     Past Surgical History:  Procedure Laterality Date  . SEPTOPLASTY  1986  . TOTAL ABDOMINAL HYSTERECTOMY W/ BILATERAL SALPINGOOPHORECTOMY  1990    Family History  Problem Relation Age of Onset  . Stroke Mother     Social History:  reports that she has never smoked. She has never used smokeless tobacco. She reports that she drinks about 21.0 standard drinks of alcohol per week. She reports that she does not use drugs.  Allergies: No Known Allergies  Medications: I have reviewed the patient's current medications. Current Meds  Medication Sig  . amLODipine (NORVASC) 10 MG tablet Take 10 mg by mouth daily.  Marland Kitchen amoxicillin (AMOXIL) 500 MG capsule TAKE 1 CAPSULE BY MOUTH THREE TIMES DAILY UNTIL GONE  . chlorhexidine (PERIDEX) 0.12 % solution   . furosemide (LASIX) 20 MG tablet TAKE 1 TABLET BY MOUTH ONCE DAILY IN THE MORNING  . ibuprofen (ADVIL,MOTRIN) 400 MG tablet Take  400 mg by mouth every 6 (six) hours as needed.    Marland Kitchen levothyroxine (SYNTHROID, LEVOTHROID) 50 MCG tablet Take 50 mcg by mouth daily.     Results for orders placed or performed during the hospital encounter of 04/06/18 (from the past 48 hour(s))  Basic metabolic panel     Status: Abnormal   Collection Time: 04/06/18 12:08 PM  Result Value Ref Range   Sodium 136 135 - 145 mmol/L   Potassium 4.7 3.5 - 5.1 mmol/L   Chloride 103 98 - 111 mmol/L   CO2 21 (L) 22 - 32 mmol/L   Glucose, Bld 98 70 - 99 mg/dL   BUN 30 (H) 8 - 23 mg/dL   Creatinine, Ser 1.77 (H) 0.44 - 1.00  mg/dL   Calcium 9.4 8.9 - 10.3 mg/dL   GFR calc non Af Amer 27 (L) >60 mL/min   GFR calc Af Amer 31 (L) >60 mL/min    Comment: (NOTE) The eGFR has been calculated using the CKD EPI equation. This calculation has not been validated in all clinical situations. eGFR's persistently <60 mL/min signify possible Chronic Kidney Disease.    Anion gap 12 5 - 15    Comment: Performed at [facility], [address], [place], [place] [address]  CBC with Differential     Status: Abnormal   Collection Time: 04/06/18 12:08 PM  Result Value Ref Range   WBC 8.7 4.0 - 10.5 K/uL   RBC 3.48 (L) 3.87 - 5.11 MIL/uL   Hemoglobin 10.9 (L) 12.0 - 15.0 g/dL   HCT 33.5 (L) 36.0 - 46.0 %   MCV 96.3 80.0 - 100.0 fL   MCH 31.3 26.0 - 34.0 pg   MCHC 32.5 30.0 - 36.0 g/dL   RDW 12.8 11.5 - 15.5 %   Platelets 370 150 - 400 K/uL   nRBC 0.0 0.0 - 0.2 %   Neutrophils Relative % 82 %   Neutro Abs 7.1 1.7 - 7.7 K/uL   Lymphocytes Relative 6 %   Lymphs Abs 0.5 (L) 0.7 - 4.0 K/uL   Monocytes Relative 11 %   Monocytes Absolute 0.9 0.1 - 1.0 K/uL   Eosinophils Relative 1 %   Eosinophils Absolute 0.0 0.0 - 0.5 K/uL   Basophils Relative 0 %   Basophils Absolute 0.0 0.0 - 0.1 K/uL   Immature Granulocytes 0 %   Abs Immature Granulocytes 0.02 0.00 - 0.07 K/uL    Comment: Performed at [facility], [address], [place], [place] [address]    Dg Chest 1 View  Result Date: 04/06/2018 CLINICAL DATA:  Pre operative respiratory exam.  Left hip fracture. EXAM: CHEST  1 VIEW COMPARISON:  Thoracic radiographs dated 12/29/2009 FINDINGS: The heart size and pulmonary vascularity are normal. Lungs are clear. Tortuosity of the brachiocephalic vessels. Chronic lobulation of the right hemidiaphragm. Thoracolumbar scoliosis, unchanged. IMPRESSION: No acute abnormality of the chest. Electronically Signed   By: Lorriane Shire M.D.   On: 04/06/2018 13:20   Dg Knee Left Port  Result Date: 04/06/2018 CLINICAL DATA:  Hip fracture. EXAM:  PORTABLE LEFT KNEE - 1-2 VIEW COMPARISON:  None. FINDINGS: Mild medial compartmental articular space narrowing. Mild tricompartmental spurring. The lateral projection is off axis. There may well be a knee effusion. IMPRESSION: 1. Probable knee effusion although the suprapatellar bursa is difficult to assess given the obliquity of the lateral projection. 2. Mild osteoarthritis. Electronically Signed   By: Van Clines M.D.   On: 04/06/2018 17:50   Dg  Hip Unilat W Or Wo Pelvis 2-3 Views Left  Result Date: 04/06/2018 CLINICAL DATA:  Left leg pain after a fall yesterday. Leg deformity. EXAM: DG HIP (WITH OR WITHOUT PELVIS) 2-3V LEFT COMPARISON:  None. FINDINGS: There is an angulated low neck fracture of the proximal left femur which appears to involve the greater trochanter. Pelvic bones are intact. IMPRESSION: Proximal left femur fracture as described. Electronically Signed   By: Lorriane Shire M.D.   On: 04/06/2018 13:18    Review of Systems  Constitutional: Negative for chills and fever.  Respiratory: Negative for shortness of breath and wheezing.   Cardiovascular: Negative for chest pain and palpitations.  Gastrointestinal: Negative for abdominal pain, nausea and vomiting.  Musculoskeletal: Positive for joint pain (Left hip pain).  Neurological: Negative for tingling and sensory change.   Blood pressure (!) 182/84, pulse 91, temperature 97.6 F (36.4 C), temperature source Oral, resp. rate 18, height '5\' 3"'  (1.6 m), weight 60.8 kg, SpO2 96 %. Physical Exam  Constitutional: She is oriented to person, place, and time. She is cooperative.  Slender white female Sleeping on arrival to room Easily arousable  HENT:  Mouth/Throat: Abnormal dentition.  Cardiovascular: Normal rate and regular rhythm.  Pulmonary/Chest: Effort normal. No respiratory distress.  Clear anterior fields  Abdominal: Soft. Bowel sounds are normal. There is no tenderness. There is no guarding.  Genitourinary:    Genitourinary Comments: Pure wick   Musculoskeletal:  Pelvis       no traumatic wounds or rash, no ecchymosis, stable to manual stress, nontender  Left Lower Extremity  Inspection:    10 lbs bucks traction in place      No deformity currently as pt in traction      No open wounds or lesions to left leg  Bony eval:     Left hip is tender to palpation     Left knee is nontender, left lower leg, ankle and foot are nontender  Soft tissue:     No open wounds or lesions to the left hip    Mild swelling to the left hip and thigh are noted    No significant ecchymosis is appreciated at this time    No significant swelling to the left lower leg, ankle or foot No knee or ankle effusions noted  Sensation:    DPN, SPN, TN sensory functions are intact Motor: EHL, FHL, lesser toe motor functions are intact Anterior tibialis, posterior tibialis, peroneals and gastrocsoleus complex motor functions are intact   Vascular:    Extremity is warm    Palpable DP and PT pulses    Compartments are soft and nontender, no pain with passive stretching  Right Lower Extremity              no open wounds or lesions, no swelling or ecchymosis   Nontender hip, knee, ankle and foot             No crepitus or gross motion noted with manipulation of the right leg  No knee or ankle effusion             No pain with axial loading or logrolling of the hip. Negative Stinchfield test   Knee stable to varus/ valgus and anterior/posterior stress             No pain with manipulation of the ankle or foot             No blocks to motion noted  Sens DPN, SPN, TN  intact  Motor EHL, FHL, lesser toe motor, Ext, flex, evers 5/5  DP 2+, PT 2+, No significant edema             Compartments are soft and nontender, no pain with passive stretching  B upper Extremities      shoulder, elbow, wrist, digits- no skin wounds, nontender, no instability, no blocks to motion  Sens  Ax/R/M/U intact  Mot   Ax/ R/ PIN/ M/ AIN/ U  intact  Rad 2+     Neurological: She is alert and oriented to person, place, and time.  Psychiatric: She has a normal mood and affect. Her speech is normal. Cognition and memory are normal.     Assessment/Plan:  77 year old white female ground-level fall with left proximal femur fracture  -Ground-level fall with low energy hip fracture  -Left proximal femur fracture, basicervical femoral neck fracture  Patient will need surgical intervention to address her fracture  I do believe that we can treat this with a cephalo-medullary nail  We will obtain traction films to better define the fracture  Would anticipate allowing the patient to be weightbearing as tolerated postoperatively with a walker  Patient will need metabolic bone work-up and outpatient bone density scan  She will definitely need to be on pharmacologic treatment for osteoporosis.  We can discuss with course of action she would like to take.  We can await until she achieves fracture union and then start her on a bisphosphonate as she is nave to any medication or we could consider starting her on Tymlos or Forteo during this acute phase which should positively affect her fracture healing.   Checked basic metabolic bone labs and optimize where possible    I am most concerned with her social welfare given that she lives at home with her bedridden demented husband is the primary caregiver.  It sounds as if they will need quite a bit of help to ensure that he does not been medically decline as well during her convalescence.  I have already sent out a communication to the patient's primary care physician, Dr. Willey Blade.  We will also discuss with our social worker and case manager here during the hospitalization.  I do think that it would be feasible for the patient to return home post hospitalization as long as all factors are considered and we can ensure a safe living environment.  Will need to discuss with patient family as  well.   Care management consult  Post-ORIF consult  RD consult   - Pain management:  Minimize narcotics  Multimodal analgesia  - ABL anemia/Hemodynamics  Hypertension   Being addressed by primary service   Monitor   Type and screen ordered  CBC is stable, repeat ordered for a.m.  - Medical issues   Per primary team  - DVT/PE prophylaxis:  Lovenox postoperatively x30 days, will adjust for renal function.  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

## 2018-04-06 NOTE — Plan of Care (Signed)
  Problem: Pain Managment: Goal: General experience of comfort will improve Outcome: Progressing   Problem: Safety: Goal: Ability to remain free from injury will improve Outcome: Progressing   

## 2018-04-06 NOTE — ED Notes (Signed)
Report given to [place] with [facility] at this time. ETA 20 minutes.

## 2018-04-06 NOTE — ED Notes (Signed)
Olivia Mackie (pt daughter)  [phone]

## 2018-04-07 ENCOUNTER — Inpatient Hospital Stay (HOSPITAL_COMMUNITY): Payer: Medicare HMO

## 2018-04-07 ENCOUNTER — Inpatient Hospital Stay (HOSPITAL_COMMUNITY): Payer: Medicare HMO | Admitting: Anesthesiology

## 2018-04-07 ENCOUNTER — Encounter (HOSPITAL_COMMUNITY): Payer: Self-pay | Admitting: *Deleted

## 2018-04-07 ENCOUNTER — Encounter (HOSPITAL_COMMUNITY)
Admission: EM | Disposition: A | Discharge status: Skilled Nursing Facility | Payer: Self-pay | Source: Home / Self Care | Attending: Internal Medicine

## 2018-04-07 DIAGNOSIS — E440 Moderate protein-calorie malnutrition: Secondary | ICD-10-CM

## 2018-04-07 HISTORY — PX: INTRAMEDULLARY (IM) NAIL INTERTROCHANTERIC: SHX5875

## 2018-04-07 LAB — COMPREHENSIVE METABOLIC PANEL
ALK PHOS: 77 U/L (ref 38–126)
ALT: 12 U/L (ref 0–44)
ANION GAP: 9 (ref 5–15)
AST: 15 U/L (ref 15–41)
Albumin: 3.4 g/dL — ABNORMAL LOW (ref 3.5–5.0)
BUN: 26 mg/dL — AB (ref 8–23)
CALCIUM: 9.1 mg/dL (ref 8.9–10.3)
CO2: 21 mmol/L — ABNORMAL LOW (ref 22–32)
CREATININE: 1.7 mg/dL — AB (ref 0.44–1.00)
Chloride: 110 mmol/L (ref 98–111)
GFR calc non Af Amer: 28 mL/min — ABNORMAL LOW (ref >60)
GFR, EST AFRICAN AMERICAN: 33 mL/min — AB (ref >60)
GLUCOSE: 112 mg/dL — AB (ref 70–99)
Potassium: 4.3 mmol/L (ref 3.5–5.1)
Sodium: 140 mmol/L (ref 135–145)
Total Bilirubin: 0.8 mg/dL (ref 0.3–1.2)
Total Protein: 6.5 g/dL (ref 6.5–8.1)

## 2018-04-07 LAB — CBC
HEMATOCRIT: 32.5 % — AB (ref 36.0–46.0)
Hemoglobin: 10.2 g/dL — ABNORMAL LOW (ref 12.0–15.0)
MCH: 30.3 pg (ref 26.0–34.0)
MCHC: 31.4 g/dL (ref 30.0–36.0)
MCV: 96.4 fL (ref 80.0–100.0)
Platelets: 389 10*3/uL (ref 150–400)
RBC: 3.37 MIL/uL — ABNORMAL LOW (ref 3.87–5.11)
RDW: 12.9 % (ref 11.5–15.5)
WBC: 7.4 10*3/uL (ref 4.0–10.5)
nRBC: 0 % (ref 0.0–0.2)

## 2018-04-07 LAB — HEMOGLOBIN A1C
HEMOGLOBIN A1C: 5 % (ref 4.8–5.6)
MEAN PLASMA GLUCOSE: 96.8 mg/dL

## 2018-04-07 LAB — PROTIME-INR
INR: 1.08
Prothrombin Time: 13.9 seconds (ref 11.4–15.2)

## 2018-04-07 LAB — PHOSPHORUS: Phosphorus: 3.9 mg/dL (ref 2.5–4.6)

## 2018-04-07 LAB — MAGNESIUM: Magnesium: 1.8 mg/dL (ref 1.7–2.4)

## 2018-04-07 LAB — TSH: TSH: 6.164 u[IU]/mL — AB (ref 0.350–4.500)

## 2018-04-07 LAB — APTT: aPTT: 28 seconds (ref 24–36)

## 2018-04-07 LAB — PREALBUMIN: PREALBUMIN: 17.9 mg/dL — AB (ref 18–38)

## 2018-04-07 LAB — MRSA PCR SCREENING: MRSA by PCR: NEGATIVE

## 2018-04-07 SURGERY — FIXATION, FRACTURE, INTERTROCHANTERIC, WITH INTRAMEDULLARY ROD
Anesthesia: Monitor Anesthesia Care | Site: Hip | Laterality: Left

## 2018-04-07 MED ORDER — FENTANYL CITRATE (PF) 250 MCG/5ML IJ SOLN
INTRAMUSCULAR | Status: AC
  Filled 2018-04-07: qty 5

## 2018-04-07 MED ORDER — BUPIVACAINE IN DEXTROSE 0.75-8.25 % IT SOLN
INTRATHECAL | Status: DC | PRN
  Administered 2018-04-07: 1.8 mL via INTRATHECAL

## 2018-04-07 MED ORDER — CEFAZOLIN SODIUM-DEXTROSE 1-4 GM/50ML-% IV SOLN
1.0000 g | Freq: Once | INTRAVENOUS | Status: AC
  Administered 2018-04-08: 1 g via INTRAVENOUS
  Filled 2018-04-07: qty 50

## 2018-04-07 MED ORDER — OXYCODONE HCL 5 MG PO TABS: 5.0000 mg | ORAL_TABLET | Freq: Once | ORAL | Status: DC | PRN

## 2018-04-07 MED ORDER — PROPOFOL 500 MG/50ML IV EMUL
INTRAVENOUS | Status: DC | PRN
  Administered 2018-04-07: 50 ug/kg/min via INTRAVENOUS

## 2018-04-07 MED ORDER — OXYCODONE HCL 5 MG/5ML PO SOLN: 5.0000 mg | Freq: Once | ORAL | Status: DC | PRN

## 2018-04-07 MED ORDER — LACTATED RINGERS IV SOLN
INTRAVENOUS | Status: DC
  Administered 2018-04-07 – 2018-04-11 (×7): via INTRAVENOUS

## 2018-04-07 MED ORDER — FENTANYL CITRATE (PF) 100 MCG/2ML IJ SOLN: 25.0000 ug | INTRAMUSCULAR | Status: DC | PRN

## 2018-04-07 MED ORDER — LEVOTHYROXINE SODIUM 75 MCG PO TABS
75.0000 ug | ORAL_TABLET | Freq: Every day | ORAL | Status: DC
  Administered 2018-04-08 – 2018-04-16 (×9): 75 ug via ORAL
  Filled 2018-04-07 (×9): qty 1

## 2018-04-07 MED ORDER — PROPOFOL 10 MG/ML IV BOLUS
INTRAVENOUS | Status: DC | PRN
  Administered 2018-04-07: 40 mg via INTRAVENOUS

## 2018-04-07 MED ORDER — ACETAMINOPHEN 10 MG/ML IV SOLN: 1000.0000 mg | Freq: Once | INTRAVENOUS | Status: DC | PRN

## 2018-04-07 MED ORDER — ACETAMINOPHEN 160 MG/5ML PO SOLN: 1000.0000 mg | Freq: Once | ORAL | Status: DC | PRN

## 2018-04-07 MED ORDER — CEFAZOLIN SODIUM-DEXTROSE 1-4 GM/50ML-% IV SOLN
1.0000 g | Freq: Three times a day (TID) | INTRAVENOUS | Status: DC
  Filled 2018-04-07 (×2): qty 50

## 2018-04-07 MED ORDER — PROPOFOL 10 MG/ML IV BOLUS
INTRAVENOUS | Status: AC
  Filled 2018-04-07: qty 20

## 2018-04-07 MED ORDER — ENSURE ENLIVE PO LIQD
237.0000 mL | Freq: Three times a day (TID) | ORAL | Status: DC
  Administered 2018-04-08 – 2018-04-16 (×20): 237 mL via ORAL

## 2018-04-07 MED ORDER — ACETAMINOPHEN 500 MG PO TABS: 1000.0000 mg | ORAL_TABLET | Freq: Once | ORAL | Status: DC | PRN

## 2018-04-07 MED ORDER — SODIUM CHLORIDE 0.9 % IV SOLN
INTRAVENOUS | Status: DC | PRN
  Administered 2018-04-07: 25 ug/min via INTRAVENOUS

## 2018-04-07 MED ORDER — 0.9 % SODIUM CHLORIDE (POUR BTL) OPTIME
TOPICAL | Status: DC | PRN
  Administered 2018-04-07: 1000 mL

## 2018-04-07 MED ORDER — ENSURE ENLIVE PO LIQD: 237.0000 mL | Freq: Three times a day (TID) | ORAL | Status: DC

## 2018-04-07 SURGICAL SUPPLY — 51 items
BIT DRILL CANN LG 4.3MM (BIT) ×1 IMPLANT
BRUSH SCRUB SURG 4.25 DISP (MISCELLANEOUS) ×6 IMPLANT
COVER PERINEAL POST (MISCELLANEOUS) ×3 IMPLANT
COVER SURGICAL LIGHT HANDLE (MISCELLANEOUS) ×3 IMPLANT
COVER WAND RF STERILE (DRAPES) ×3 IMPLANT
DRAPE C-ARMOR (DRAPES) ×3 IMPLANT
DRAPE ORTHO SPLIT 77X108 STRL (DRAPES) ×4
DRAPE STERI IOBAN 125X83 (DRAPES) ×3 IMPLANT
DRAPE SURG ORHT 6 SPLT 77X108 (DRAPES) ×2 IMPLANT
DRILL BIT CANN LG 4.3MM (BIT) ×3
DRSG EMULSION OIL 3X3 NADH (GAUZE/BANDAGES/DRESSINGS) ×3 IMPLANT
DRSG MEPILEX BORDER 4X4 (GAUZE/BANDAGES/DRESSINGS) ×3 IMPLANT
DRSG MEPILEX BORDER 4X8 (GAUZE/BANDAGES/DRESSINGS) ×3 IMPLANT
ELECT REM PT RETURN 9FT ADLT (ELECTROSURGICAL) ×3
ELECTRODE REM PT RTRN 9FT ADLT (ELECTROSURGICAL) ×1 IMPLANT
GLOVE BIO SURGEON STRL SZ7.5 (GLOVE) ×3 IMPLANT
GLOVE BIO SURGEON STRL SZ8 (GLOVE) ×3 IMPLANT
GLOVE BIOGEL PI IND STRL 7.5 (GLOVE) ×1 IMPLANT
GLOVE BIOGEL PI IND STRL 8 (GLOVE) ×1 IMPLANT
GLOVE BIOGEL PI INDICATOR 7.5 (GLOVE) ×2
GLOVE BIOGEL PI INDICATOR 8 (GLOVE) ×2
GLOVE SURG SS PI 8.0 STRL IVOR (GLOVE) ×3 IMPLANT
GOWN STRL REUS W/ TWL LRG LVL3 (GOWN DISPOSABLE) ×2 IMPLANT
GOWN STRL REUS W/ TWL XL LVL3 (GOWN DISPOSABLE) ×1 IMPLANT
GOWN STRL REUS W/TWL LRG LVL3 (GOWN DISPOSABLE) ×4
GOWN STRL REUS W/TWL XL LVL3 (GOWN DISPOSABLE) ×2
GUIDEPIN 3.2X17.5 THRD DISP (PIN) ×9 IMPLANT
GUIDEWIRE BALL NOSE 80CM (WIRE) ×3 IMPLANT
HOOK EXTRACTION FOR CANN NAIL (Orthopedic Implant) ×3 IMPLANT
KIT BASIN OR (CUSTOM PROCEDURE TRAY) ×3 IMPLANT
KIT TURNOVER KIT B (KITS) ×3 IMPLANT
LINER BOOT UNIVERSAL DISP (MISCELLANEOUS) ×3 IMPLANT
MANIFOLD NEPTUNE II (INSTRUMENTS) ×3 IMPLANT
NAIL HIP FRACT 130D 11X180 (Screw) ×3 IMPLANT
NS IRRIG 1000ML POUR BTL (IV SOLUTION) ×3 IMPLANT
PACK GENERAL/GYN (CUSTOM PROCEDURE TRAY) ×3 IMPLANT
PAD ARMBOARD 7.5X6 YLW CONV (MISCELLANEOUS) ×6 IMPLANT
SCREW BONE CORTICAL 5.0X32 (Screw) ×3 IMPLANT
SCREW LAG HIP NAIL 10.5X95 (Screw) ×3 IMPLANT
STAPLER VISISTAT 35W (STAPLE) ×3 IMPLANT
SUT ETHILON 3 0 PS 1 (SUTURE) ×6 IMPLANT
SUT VIC AB 0 CT1 27 (SUTURE) ×2
SUT VIC AB 0 CT1 27XBRD ANBCTR (SUTURE) ×1 IMPLANT
SUT VIC AB 1 CT1 27 (SUTURE) ×2
SUT VIC AB 1 CT1 27XBRD ANBCTR (SUTURE) ×1 IMPLANT
SUT VIC AB 2-0 CT1 27 (SUTURE) ×2
SUT VIC AB 2-0 CT1 TAPERPNT 27 (SUTURE) ×1 IMPLANT
TOWEL OR 17X24 6PK STRL BLUE (TOWEL DISPOSABLE) ×3 IMPLANT
TOWEL OR 17X26 10 PK STRL BLUE (TOWEL DISPOSABLE) ×6 IMPLANT
TRAY FOLEY CATH 14FR (SET/KITS/TRAYS/PACK) ×3 IMPLANT
WATER STERILE IRR 1000ML POUR (IV SOLUTION) IMPLANT

## 2018-04-07 NOTE — Anesthesia Preprocedure Evaluation (Signed)
Anesthesia Evaluation  Patient identified by MRN, date of birth, ID band Patient confused    Reviewed: Allergy & Precautions, NPO status , Patient's Chart, lab work & pertinent test results  History of Anesthesia Complications Negative for: history of anesthetic complications  Airway Mallampati: III  TM Distance: >3 FB Neck ROM: Full    Dental  (+) Teeth Intact   Pulmonary neg pulmonary ROS,    breath sounds clear to auscultation       Cardiovascular hypertension, Pt. on medications  Rhythm:Regular     Neuro/Psych negative neurological ROS  negative psych ROS   GI/Hepatic negative GI ROS, Neg liver ROS,   Endo/Other  Hypothyroidism   Renal/GU CRFRenal disease     Musculoskeletal Left hip fx   Abdominal   Peds  Hematology  (+) anemia ,   Anesthesia Other Findings   Reproductive/Obstetrics                             Anesthesia Physical Anesthesia Plan  ASA: III  Anesthesia Plan: MAC and Spinal   Post-op Pain Management:    Induction:   PONV Risk Score and Plan: 2 and Treatment may vary due to age or medical condition and Ondansetron  Airway Management Planned: Nasal Cannula  Additional Equipment:   Intra-op Plan:   Post-operative Plan:   Informed Consent: I have reviewed the patients History and Physical, chart, labs and discussed the procedure including the risks, benefits and alternatives for the proposed anesthesia with the patient or authorized representative who has indicated his/her understanding and acceptance.   Dental advisory given  Plan Discussed with: CRNA and Surgeon  Anesthesia Plan Comments: (Ativan given per CIWA protocol. Patient somnolent and able to answer few questions in between snoring )        Anesthesia Quick Evaluation

## 2018-04-07 NOTE — Progress Notes (Signed)
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   GOAL:   Patient will meet greater than or equal to 90% of their needs   MONITOR:   PO intake, Supplement acceptance, Diet advancement  REASON FOR ASSESSMENT:   Consult Hip fracture protocol  ASSESSMENT:   Pt with PMH CKD IV, HTN, Hypothyroidism. Admitted due to fall and left femur fracture.   Pt alert and oriented when I entered the room.   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 Breakfast includes cereal with blueberries and peaches and some scrambled eggs. For lunch, a grilled cheese and coleslaw and for dinner rotisserie chicken, baked potato and broccoli. She doesn't eat snacks through out the day often, but has a hard time following the renal diet for her CKD.   UBW around 137#, but states that this was a couple of months ago. She reports a 4-5# weight loss in the last 3 months. No recent weights PTA in the chart.  Pt wears dentures but states she doesn't have any trouble chewing or swallowing.    Medications Reviewed and Include: Folic Acid 1 mg/d Lasix 20 mg/d MVI  Thiamine 100 mg/d  Labs Reviewed:  Glucose 112 (H) Creatinine 1.7 (H) BUN 26 (H)   NUTRITION - FOCUSED PHYSICAL EXAM:    Most Recent Value  Orbital Region  Mild depletion  Upper Arm Region  Moderate depletion  Thoracic and Lumbar Region  No depletion  Buccal Region  Mild depletion  Temple Region  Moderate depletion  Clavicle Bone Region  No depletion  Clavicle and Acromion Bone Region  Mild depletion  Scapular Bone Region  No depletion  Dorsal Hand  Mild depletion  Patellar Region  Mild depletion   Anterior Thigh Region  Mild depletion  Posterior Calf Region  Mild depletion  Edema (RD Assessment)  None  Hair  Reviewed  Eyes  Reviewed  Mouth  Reviewed [dentures]  Skin  Reviewed [dry]  Nails  Reviewed       Diet Order:   Diet Order            Diet NPO time specified Except for: Sips with Meds  Diet effective midnight              EDUCATION NEEDS:   No education needs have been identified at this time  Skin:  Skin Assessment: Reviewed RN Assessment  Last BM:  11/23  Height:   Ht Readings from Last 1 Encounters:  04/06/18 5\' 3"  (1.6 m)    Weight:   Wt Readings from Last 1 Encounters:  04/06/18 60.8 kg    Ideal Body Weight:  52.27 kg  BMI:  Body mass index is 23.74 kg/m.  Estimated Nutritional Needs:   Kcal:  1500-1700  Protein:  75-90 g  Fluid:  >1.5 L    Mauricia Area, MS, Dietetic Intern Pager: [phone] After hours Pager: [phone]

## 2018-04-07 NOTE — Anesthesia Procedure Notes (Signed)
Procedure Name: MAC Date/Time: 04/07/2018 4:40 PM Performed by: Oletta Lamas, CRNA Pre-anesthesia Checklist: Patient identified, Emergency Drugs available, Suction available, Patient being monitored and Timeout performed Patient Re-evaluated:Patient Re-evaluated prior to induction Oxygen Delivery Method: Simple face mask

## 2018-04-07 NOTE — Progress Notes (Signed)
Awaiting for surgery.  BP 175/96- current medication order.  Hydralazine 10mg  prn if SBP greater than 180.  Will hold at this time

## 2018-04-07 NOTE — Progress Notes (Signed)
Patient Demographics:    Sandra Hunter, is a 77 y.o. female, DOB - 1940-10-15, OQH:[id number]  Admit date - 04/06/2018   Admitting Physician Orson Eva, MD  Outpatient Primary MD for the patient is [name], MD  LOS - 1   Chief Complaint  Patient presents with  . Fall        Subjective:    Sandra Hunter today has no fevers, no emesis,  No chest pain, states left hip area discomfort with movement,  Assessment  & Plan :    Active Problems:   Hypothyroidism   Essential hypertension   Femoral neck fracture (HCC)   CKD (chronic kidney disease), stage III (HCC)   Malnutrition of moderate degree  Brief summary:- 77 y.o. female with medical history of essential hypertension, CKD stage IV, hypothyroidism, rectal prolapse admitted from [facility] on 04/06/2018 with left hip fracture after mechanical fall on 04/05/2018   Plan:- 1)Lt distal femoral neck fracture involving the greater trochanter----ORIF as per orthopedic team  2)Anemia---??? acute versus chronic no recent baseline available hemoglobin is 10.2, suspect patient has some degree of anemia of chronic disease kidney disease and now have some degree of superimposed acute blood loss anemia secondary to left hip fracture--- H&H transfuse as clinically indicated  3) CKD stage IV--- no recent creatinine available, continue currently around 1.7, not excluded some degree of AKI superimposed on baseline CKD, continue IV fluids, renally adjust medications, avoid nephrotoxic agents / dehydration/hypotension  4)Hypothyroidism--- TSH is currently 6.2, increase levothyroxine to 75 mcg daily  5)HTN--- BP is not at goal, somewhat elevated most likely due to pain , restart amlodipine 10 mg daily, may use IV hydralazine as needed elevated BP  Disposition/Need for in-Hospital Stay- patient unable to be discharged at this time due to left hip  fracture requiring ORIF,   Code Status : Full  Disposition Plan  : TBD  Consults  :  ortho   DVT Prophylaxis  : As per orthopedic team postop   Lab Results  Component Value Date   PLT 389 04/07/2018    Inpatient Medications  Scheduled Meds: . [MAR Hold] amLODipine  10 mg Oral Daily  . [MAR Hold] enoxaparin (LOVENOX) injection  30 mg Subcutaneous Q24H  . [MAR Hold] feeding supplement (ENSURE ENLIVE)  237 mL Oral TID BM  . [MAR Hold] folic acid  1 mg Oral Daily  . [MAR Hold] furosemide  20 mg Oral Daily  . [MAR Hold] levothyroxine  50 mcg Oral Daily  . [MAR Hold] LORazepam  0-4 mg Intravenous Q6H   Followed by  . [MAR Hold] LORazepam  0-4 mg Intravenous Q12H  . [MAR Hold] multivitamin with minerals  1 tablet Oral Daily  . [MAR Hold] thiamine  100 mg Oral Daily   Or  . [MAR Hold] thiamine  100 mg Intravenous Daily   Continuous Infusions: . sodium chloride 100 mL/hr at 04/06/18 2236  . acetaminophen    . lactated ringers 10 mL/hr at 04/07/18 1532   PRN Meds:.acetaminophen, acetaminophen **OR** acetaminophen (TYLENOL) oral liquid 160 mg/5 mL, fentaNYL (SUBLIMAZE) injection, [MAR Hold] hydrALAZINE, [MAR Hold] HYDROcodone-acetaminophen, [MAR Hold] LORazepam **OR** [MAR Hold] LORazepam, [MAR Hold]  morphine injection, [MAR Hold] ondansetron (ZOFRAN) IV, oxyCODONE **OR** oxyCODONE  Anti-infectives (From admission, onward)   Start     Dose/Rate Route Frequency Ordered Stop   04/07/18 0730  ceFAZolin (ANCEF) IVPB 2g/100 mL premix     2 g 200 mL/hr over 30 Minutes Intravenous On call to O.R. 04/06/18 1918 04/07/18 1714   04/06/18 2200  amoxicillin (AMOXIL) capsule 500 mg  Status:  Discontinued     500 mg Oral Every 8 hours 04/06/18 1831 04/07/18 2001        Objective:   Vitals:   04/07/18 1523 04/07/18 1854 04/07/18 1909 04/07/18 1926  BP:  (!) 152/83 (!) 154/83 (!) 145/88  Pulse:  98 96   Resp:  (!) 24 16 18   Temp:  (!) 97.4 F (36.3 C)  (!) 97.4 F (36.3 C)    TempSrc:      SpO2:  95% 94% 95%  Weight: 60.8 kg     Height: 5\' 3"  (1.6 m)       Wt Readings from Last 3 Encounters:  04/07/18 60.8 kg  03/14/16 61.7 kg     Intake/Output Summary (Last 24 hours) at 04/07/2018 2003 Last data filed at 04/07/2018 1926 Gross per 24 hour  Intake 1200 ml  Output 1075 ml  Net 125 ml     Physical Exam Patient is examined daily including today on 04/07/18 , exams remain the same as of yesterday except that has changed   Gen:- Awake Alert, appears comfortable HEENT:- [place].AT, No sclera icterus Neck-Supple Neck,No JVD,.  Lungs-  CTAB , fair symmetrical air movement CV- S1, S2 normal, regular  Abd-  +ve B.Sounds, Abd Soft, No tenderness,    Extremity/Skin:-   pedal pulses present , left lower extremity in traction Psych-affect is appropriate, oriented x3 Neuro-no new focal deficits, no tremors   Data Review:   Micro Results Recent Results (from the past 240 hour(s))  MRSA PCR Screening     Status: None   Collection Time: 04/06/18 11:21 PM  Result Value Ref Range Status   MRSA by PCR NEGATIVE NEGATIVE Final    Comment:        The GeneXpert MRSA Assay (FDA approved for NASAL specimens only), is one component of a comprehensive MRSA colonization surveillance program. It is not intended to diagnose MRSA infection nor to guide or monitor treatment for MRSA infections. Performed at [facility], [place] [address] [place], [place] [address]     Radiology Reports Dg Chest 1 View  Result Date: 04/06/2018 CLINICAL DATA:  Pre operative respiratory exam.  Left hip fracture. EXAM: CHEST  1 VIEW COMPARISON:  Thoracic radiographs dated 12/29/2009 FINDINGS: The heart size and pulmonary vascularity are normal. Lungs are clear. Tortuosity of the brachiocephalic vessels. Chronic lobulation of the right hemidiaphragm. Thoracolumbar scoliosis, unchanged. IMPRESSION: No acute abnormality of the chest. Electronically Signed   By: Lorriane Shire M.D.    On: 04/06/2018 13:20   Dg Knee Left Port  Result Date: 04/06/2018 CLINICAL DATA:  Hip fracture. EXAM: PORTABLE LEFT KNEE - 1-2 VIEW COMPARISON:  None. FINDINGS: Mild medial compartmental articular space narrowing. Mild tricompartmental spurring. The lateral projection is off axis. There may well be a knee effusion. IMPRESSION: 1. Probable knee effusion although the suprapatellar bursa is difficult to assess given the obliquity of the lateral projection. 2. Mild osteoarthritis. Electronically Signed   By: Van Clines M.D.   On: 04/06/2018 17:50   Dg Hip Port Unilat With Pelvis 1v Left  Result Date: 04/06/2018 CLINICAL DATA:  Hip fracture. EXAM: DG  HIP (WITH OR WITHOUT PELVIS) 1V PORT LEFT COMPARISON:  04/06/2018 FINDINGS: No significant change in the appearance of distal left femoral neck fracture, which involves the greater trochanter. Impaction of the distal fracture fragment, similar to the prior image. There is overlying soft tissue swelling. IMPRESSION: Impacted fracture of the distal left femoral neck, which involves the greater trochanter. Electronically Signed   By: Fidela Salisbury M.D.   On: 04/06/2018 21:40   Dg Hip Unilat W Or Wo Pelvis 2-3 Views Left  Result Date: 04/06/2018 CLINICAL DATA:  Left leg pain after a fall yesterday. Leg deformity. EXAM: DG HIP (WITH OR WITHOUT PELVIS) 2-3V LEFT COMPARISON:  None. FINDINGS: There is an angulated low neck fracture of the proximal left femur which appears to involve the greater trochanter. Pelvic bones are intact. IMPRESSION: Proximal left femur fracture as described. Electronically Signed   By: Lorriane Shire M.D.   On: 04/06/2018 13:18     CBC Recent Labs  Lab 04/06/18 1208 04/07/18 0437  WBC 8.7 7.4  HGB 10.9* 10.2*  HCT 33.5* 32.5*  PLT 370 389  MCV 96.3 96.4  MCH 31.3 30.3  MCHC 32.5 31.4  RDW 12.8 12.9  LYMPHSABS 0.5*  --   MONOABS 0.9  --   EOSABS 0.0  --   BASOSABS 0.0  --     Chemistries  Recent Labs  Lab  04/06/18 1208 04/07/18 0437  NA 136 140  K 4.7 4.3  CL 103 110  CO2 21* 21*  GLUCOSE 98 112*  BUN 30* 26*  CREATININE 1.77* 1.70*  CALCIUM 9.4 9.1  MG  --  1.8  AST  --  15  ALT  --  12  ALKPHOS  --  77  BILITOT  --  0.8   ------------------------------------------------------------------------------------------------------------------ No results for input(s): CHOL, HDL, LDLCALC, TRIG, CHOLHDL, LDLDIRECT in the last 72 hours.  Lab Results  Component Value Date   HGBA1C 5.0 04/07/2018   ------------------------------------------------------------------------------------------------------------------ Recent Labs    04/07/18 0437  TSH 6.164*   ------------------------------------------------------------------------------------------------------------------ No results for input(s): VITAMINB12, FOLATE, FERRITIN, TIBC, IRON, RETICCTPCT in the last 72 hours.  Coagulation profile Recent Labs  Lab 04/07/18 0437  INR 1.08    No results for input(s): DDIMER in the last 72 hours.  Cardiac Enzymes No results for input(s): CKMB, TROPONINI, MYOGLOBIN in the last 168 hours.  Invalid input(s): CK ------------------------------------------------------------------------------------------------------------------ No results found for: BNP   Roxan Hockey M.D on 04/07/2018 at 8:03 PM  Pager---[phone] Go to [email or web address] - password [email or web address] for contact info  [facility] - Office  [phone]

## 2018-04-07 NOTE — Anesthesia Procedure Notes (Signed)
Spinal  Patient location during procedure: OR Start time: 04/07/2018 4:42 PM End time: 04/07/2018 4:52 PM Staffing Anesthesiologist: Oleta Mouse, MD Performed: anesthesiologist  Preanesthetic Checklist Completed: patient identified, surgical consent, pre-op evaluation, timeout performed, IV checked, risks and benefits discussed and monitors and equipment checked Spinal Block Patient position: left lateral decubitus Prep: ChloraPrep Patient monitoring: heart rate, cardiac monitor, continuous pulse ox and blood pressure Approach: midline Location: L3-4 Injection technique: single-shot Needle Needle type: Pencan  Needle gauge: 24 G Needle length: 9 cm Assessment Sensory level: T6

## 2018-04-07 NOTE — Plan of Care (Signed)
  Problem: Pain Managment: Goal: General experience of comfort will improve Outcome: Progressing   Problem: Safety: Goal: Ability to remain free from injury will improve Outcome: Progressing   

## 2018-04-07 NOTE — Transfer of Care (Signed)
Immediate Anesthesia Transfer of Care Note  Patient: Sandra Hunter  Procedure(s) Performed: INTRAMEDULLARY (IM) NAIL INTERTROCHANTRIC (Left Hip)  Patient Location: PACU  Anesthesia Type:General  Level of Consciousness: awake, drowsy and patient cooperative  Airway & Oxygen Therapy: Patient Spontanous Breathing  Post-op Assessment: Report given to RN and Post -op Vital signs reviewed and stable  Post vital signs: Reviewed and stable  Last Vitals:  Vitals Value Taken Time  BP 152/83 04/07/2018  6:54 PM  Temp 36.3 C 04/07/2018  6:54 PM  Pulse 98 04/07/2018  6:54 PM  Resp 24 04/07/2018  6:54 PM  SpO2 95 % 04/07/2018  6:54 PM    Last Pain:  Vitals:   04/07/18 0909  TempSrc:   PainSc: 3          Complications: No apparent anesthesia complications

## 2018-04-07 NOTE — Progress Notes (Signed)
Post op abx:  Pt is s/p ORIF. Ancef was ordered x 3 doses. However, her renal function is impaired. We will change the dose to x1 twelve hours after the pre-op dose.   Ancef 1g x1

## 2018-04-08 ENCOUNTER — Inpatient Hospital Stay (HOSPITAL_COMMUNITY): Payer: Medicare HMO

## 2018-04-08 DIAGNOSIS — N179 Acute kidney failure, unspecified: Secondary | ICD-10-CM

## 2018-04-08 DIAGNOSIS — S72002P Fracture of unspecified part of neck of left femur, subsequent encounter for closed fracture with malunion: Secondary | ICD-10-CM

## 2018-04-08 DIAGNOSIS — E039 Hypothyroidism, unspecified: Secondary | ICD-10-CM

## 2018-04-08 DIAGNOSIS — S72002A Fracture of unspecified part of neck of left femur, initial encounter for closed fracture: Secondary | ICD-10-CM

## 2018-04-08 LAB — CBC
HEMATOCRIT: 29.5 % — AB (ref 36.0–46.0)
Hemoglobin: 9 g/dL — ABNORMAL LOW (ref 12.0–15.0)
MCH: 30.5 pg (ref 26.0–34.0)
MCHC: 30.5 g/dL (ref 30.0–36.0)
MCV: 100 fL (ref 80.0–100.0)
Platelets: 294 10*3/uL (ref 150–400)
RBC: 2.95 MIL/uL — ABNORMAL LOW (ref 3.87–5.11)
RDW: 13.2 % (ref 11.5–15.5)
WBC: 7.1 10*3/uL (ref 4.0–10.5)
nRBC: 0 % (ref 0.0–0.2)

## 2018-04-08 LAB — VITAMIN D 25 HYDROXY (VIT D DEFICIENCY, FRACTURES): VIT D 25 HYDROXY: 12.3 ng/mL — AB (ref 30.0–100.0)

## 2018-04-08 LAB — BASIC METABOLIC PANEL
Anion gap: 9 (ref 5–15)
BUN: 23 mg/dL (ref 8–23)
CO2: 20 mmol/L — AB (ref 22–32)
Calcium: 8.7 mg/dL — ABNORMAL LOW (ref 8.9–10.3)
Chloride: 110 mmol/L (ref 98–111)
Creatinine, Ser: 1.83 mg/dL — ABNORMAL HIGH (ref 0.44–1.00)
GFR calc Af Amer: 31 mL/min — ABNORMAL LOW (ref >60)
GFR calc non Af Amer: 27 mL/min — ABNORMAL LOW (ref >60)
GLUCOSE: 106 mg/dL — AB (ref 70–99)
POTASSIUM: 4.7 mmol/L (ref 3.5–5.1)
Sodium: 139 mmol/L (ref 135–145)

## 2018-04-08 LAB — IRON AND TIBC
IRON: 10 ug/dL — AB (ref 28–170)
SATURATION RATIOS: 4 % — AB (ref 10.4–31.8)
TIBC: 248 ug/dL — ABNORMAL LOW (ref 250–450)
UIBC: 238 ug/dL

## 2018-04-08 LAB — URINALYSIS, ROUTINE W REFLEX MICROSCOPIC
BILIRUBIN URINE: NEGATIVE
Glucose, UA: NEGATIVE mg/dL
Ketones, ur: NEGATIVE mg/dL
Nitrite: NEGATIVE
PROTEIN: 30 mg/dL — AB
Specific Gravity, Urine: 1.018 (ref 1.005–1.030)
pH: 5 (ref 5.0–8.0)

## 2018-04-08 LAB — VITAMIN B12: Vitamin B-12: 429 pg/mL (ref 180–914)

## 2018-04-08 LAB — PTH, INTACT AND CALCIUM
Calcium, Total (PTH): 9.2 mg/dL (ref 8.7–10.3)
PTH: 80 pg/mL — AB (ref 15–65)

## 2018-04-08 LAB — CALCITRIOL (1,25 DI-OH VIT D): VIT D 1 25 DIHYDROXY: 18.6 pg/mL — AB (ref 19.9–79.3)

## 2018-04-08 LAB — RETICULOCYTES
Immature Retic Fract: 10.3 % (ref 2.3–15.9)
RBC.: 2.71 MIL/uL — AB (ref 3.87–5.11)
Retic Count, Absolute: 52.6 10*3/uL (ref 19.0–186.0)
Retic Ct Pct: 1.9 % (ref 0.4–3.1)

## 2018-04-08 LAB — FERRITIN: Ferritin: 58 ng/mL (ref 11–307)

## 2018-04-08 LAB — FOLATE: FOLATE: 12.9 ng/mL (ref >5.9)

## 2018-04-08 LAB — SODIUM, URINE, RANDOM: SODIUM UR: 27 mmol/L

## 2018-04-08 LAB — CREATININE, URINE, RANDOM: Creatinine, Urine: 200.41 mg/dL

## 2018-04-08 NOTE — Progress Notes (Signed)
[facility] ([facility])  1 Day Post-Op Procedure(s) (LRB): INTRAMEDULLARY (IM) NAIL INTERTROCHANTRIC (Left)  Subjective: Patient reports pain as mild.  Easily arousable but somnolent.  Objective: Current Vitals Blood pressure (!) 179/89, pulse 100, temperature 98.3 F (36.8 C), temperature source Oral, resp. rate 18, height 5\' 3"  (1.6 m), weight 60.8 kg, SpO2 96 %. Vital signs in last 24 hours: Temp:  [97.4 F (36.3 C)-98.5 F (36.9 C)] 98.3 F (36.8 C) (11/27 0447) Pulse Rate:  [96-115] 100 (11/27 0447) Resp:  [16-24] 18 (11/27 0447) BP: (145-179)/(83-96) 179/89 (11/27 0447) SpO2:  [94 %-98 %] 96 % (11/27 0447) Weight:  [60.8 kg] 60.8 kg (11/26 1523)  Intake/Output from previous day: 11/26 0701 - 11/27 0700 In: 1320 [P.O.:120; I.V.:1200] Out: 375 [Urine:300; Blood:75]  LABS Recent Labs    04/06/18 1208 04/07/18 0437 04/08/18 0255  HGB 10.9* 10.2* 9.0*   Recent Labs    04/07/18 0437 04/08/18 0255 04/08/18 0849  WBC 7.4 7.1  --   RBC 3.37* 2.95* 2.71*  HCT 32.5* 29.5*  --   PLT 389 294  --    Recent Labs    04/07/18 0437 04/08/18 0255  NA 140 139  K 4.3 4.7  CL 110 110  CO2 21* 20*  BUN 26* 23  CREATININE 1.70* 1.83*  GLUCOSE 112* 106*  CALCIUM 9.1  9.2 8.7*   Recent Labs    04/07/18 0437  INR 1.08     Physical Exam LLE  Dressing intact, clean, dry  Edema/ swelling controlled  Sens: DPN, SPN, TN intact  Motor: EHL, FHL, and lessor toe ext and flex all intact grossly  Brisk cap refill, warm to touch  Assessment/Plan: 1 Day Post-Op Procedure(s) (LRB): INTRAMEDULLARY (IM) NAIL INTERTROCHANTRIC (Left) 1. PT/OT  2. DVT proph Lovenox 3. F/u 8-14 days  Altamese Nobles, MD [facility] [phone] (p)

## 2018-04-08 NOTE — Progress Notes (Signed)
PROGRESS NOTE    Sandra Hunter  JIR:[id number] DOB: 1941/02/21 DOA: 04/06/2018 PCP: [name], MD    Brief Narrative:   77 y.o.femalewith medical history ofessential hypertension, CKD stage IV, hypothyroidism, rectal prolapse admitted from [facility] on 04/06/2018 with left hip fracture after mechanical fall on 04/05/2018   Assessment & Plan:   Principal Problem:   Femoral neck fracture (HCC) Active Problems:   Hypothyroidism   Essential hypertension   CKD (chronic kidney disease), stage III (HCC)   Malnutrition of moderate degree  #1 left distal femoral neck fracture involving greater trochanter Secondary to mechanical fall.  Patient seen by orthopedics and patient underwent IM nail intertrochanteric left per Dr. Marcelino Scot 04/07/2018.  PT/OT pending.  Per orthopedics.  2.  Hypothyroidism TSH noted to be elevated at 6.2.  Synthroid dose has been increased to 75 MCG's daily.  Will need repeat thyroid function studies done in about 4 to 6 weeks.  Outpatient follow-up with PCP.  3.  Hypertension Patient noted to have significantly elevated blood pressure felt likely to be elevated secondary to pain.  Patient's Norvasc has been resumed.  Hydralazine as needed.  4.  Chronic kidney disease stage IV versus acute renal failure No recent renal function noted on epic.  Last creatinine noted to be in the system was 0.93 in July 2011.  Patient with urine output.  Check a fractional excretion of sodium.  Check a UA with cultures and sensitivities.  Check a renal ultrasound.  Follow creatinine.  5.  Anemia Likely secondary to acute postop blood loss versus dilutional.  Patient with no overt bleeding.  Check an anemia panel.  Follow H&H.  Transfusion threshold hemoglobin less than 7.   DVT prophylaxis: Lovenox Code Status: Full Family Communication: Updated patient and daughter at bedside. Disposition Plan: Pending PT evaluation.   Consultants:   Orthopedics: Dr. Marcelino Scot  04/06/2018  Procedures:   Plain films of the left knee 04/06/2018  Chest x-ray 04/06/2018  Plain films of the left hip and pelvis 04/06/2018  Plain films of the left femur 04/07/2018  Renal ultrasound pending 04/08/2018  IM nail left intertrochanteric 04/07/2018 per Dr. Marcelino Scot  Antimicrobials:   None   Subjective: Laying in bed.  Some improvement with hip pain.  Denies any chest pain no shortness of breath.  Objective: Vitals:   04/07/18 1926 04/07/18 2110 04/08/18 0036 04/08/18 0447  BP: (!) 145/88 (!) 169/86 (!) 170/95 (!) 179/89  Pulse:  96 (!) 115 100  Resp: 18 18 18 18   Temp: (!) 97.4 F (36.3 C) 97.8 F (36.6 C) 98.5 F (36.9 C) 98.3 F (36.8 C)  TempSrc:  Oral Oral Oral  SpO2: 95% 95%  96%  Weight:      Height:        Intake/Output Summary (Last 24 hours) at 04/08/2018 1257 Last data filed at 04/08/2018 0445 Gross per 24 hour  Intake 1320 ml  Output 375 ml  Net 945 ml   Filed Weights   04/06/18 1143 04/07/18 1523  Weight: 60.8 kg 60.8 kg    Examination:  General exam: Appears calm and comfortable  Respiratory system: Clear to auscultation. Respiratory effort normal. Cardiovascular system: S1 & S2 heard, RRR. No JVD, murmurs, rubs, gallops or clicks. No pedal edema. Gastrointestinal system: Abdomen is nondistended, soft and nontender. No organomegaly or masses felt. Normal bowel sounds heard. Central nervous system: Alert and oriented. No focal neurological deficits. Extremities: Symmetric 5 x 5 power. Skin: No rashes, lesions or  ulcers Psychiatry: Judgement and insight appear normal. Mood & affect appropriate.     Data Reviewed: I have personally reviewed following labs and imaging studies  CBC: Recent Labs  Lab 04/06/18 1208 04/07/18 0437 04/08/18 0255  WBC 8.7 7.4 7.1  NEUTROABS 7.1  --   --   HGB 10.9* 10.2* 9.0*  HCT 33.5* 32.5* 29.5*  MCV 96.3 96.4 100.0  PLT 370 389 025   Basic Metabolic Panel: Recent Labs  Lab  04/06/18 1208 04/07/18 0437 04/08/18 0255  NA 136 140 139  K 4.7 4.3 4.7  CL 103 110 110  CO2 21* 21* 20*  GLUCOSE 98 112* 106*  BUN 30* 26* 23  CREATININE 1.77* 1.70* 1.83*  CALCIUM 9.4 9.1  9.2 8.7*  MG  --  1.8  --   PHOS  --  3.9  --    GFR: Estimated Creatinine Clearance: 22 mL/min (A) (by C-G formula based on SCr of 1.83 mg/dL (H)). Liver Function Tests: Recent Labs  Lab 04/07/18 0437  AST 15  ALT 12  ALKPHOS 77  BILITOT 0.8  PROT 6.5  ALBUMIN 3.4*   No results for input(s): LIPASE, AMYLASE in the last 168 hours. No results for input(s): AMMONIA in the last 168 hours. Coagulation Profile: Recent Labs  Lab 04/07/18 0437  INR 1.08   Cardiac Enzymes: No results for input(s): CKTOTAL, CKMB, CKMBINDEX, TROPONINI in the last 168 hours. BNP (last 3 results) No results for input(s): PROBNP in the last 8760 hours. HbA1C: Recent Labs    04/07/18 0437  HGBA1C 5.0   CBG: No results for input(s): GLUCAP in the last 168 hours. Lipid Profile: No results for input(s): CHOL, HDL, LDLCALC, TRIG, CHOLHDL, LDLDIRECT in the last 72 hours. Thyroid Function Tests: Recent Labs    04/07/18 0437  TSH 6.164*   Anemia Panel: Recent Labs    04/08/18 0849  VITAMINB12 429  FOLATE 12.9  FERRITIN 58  TIBC 248*  IRON 10*  RETICCTPCT 1.9   Sepsis Labs: No results for input(s): PROCALCITON, LATICACIDVEN in the last 168 hours.  Recent Results (from the past 240 hour(s))  MRSA PCR Screening     Status: None   Collection Time: 04/06/18 11:21 PM  Result Value Ref Range Status   MRSA by PCR NEGATIVE NEGATIVE Final    Comment:        The GeneXpert MRSA Assay (FDA approved for NASAL specimens only), is one component of a comprehensive MRSA colonization surveillance program. It is not intended to diagnose MRSA infection nor to guide or monitor treatment for MRSA infections. Performed at [facility], [place] [address] [place], [place] [address]           Radiology Studies: Dg Chest 1 View  Result Date: 04/06/2018 CLINICAL DATA:  Pre operative respiratory exam.  Left hip fracture. EXAM: CHEST  1 VIEW COMPARISON:  Thoracic radiographs dated 12/29/2009 FINDINGS: The heart size and pulmonary vascularity are normal. Lungs are clear. Tortuosity of the brachiocephalic vessels. Chronic lobulation of the right hemidiaphragm. Thoracolumbar scoliosis, unchanged. IMPRESSION: No acute abnormality of the chest. Electronically Signed   By: Lorriane Shire M.D.   On: 04/06/2018 13:20   US Renal  Result Date: 04/08/2018 CLINICAL DATA:  Chronic kidney disease.  Acute renal failure. EXAM: RENAL / URINARY TRACT ULTRASOUND COMPLETE COMPARISON:  Abdominal ultrasound 12/06/2016. FINDINGS: Right Kidney: Renal measurements: 7.3 x 4.0 x 5.7 cm = volume: 166 mL. Renal parenchyma is thinned. Parenchyma is diffusely hyperechoic. No  focal lesions are present. There is no stone or mass lesion. No hydronephrosis is present. Left Kidney: Renal measurements: 6.5 x 4.0 x 4.1 cm = volume: 107 mL. Renal parenchyma is thinned. Parenchyma is diffusely hyperechoic. No focal lesions are present. There is no stone or mass lesion. No hydronephrosis is present. Bladder: Appears normal for degree of bladder distention. IMPRESSION: Small echogenic kidneys bilaterally. The finding is nonspecific, but can be seen in the setting of medical renal disease. There is no significant interval change. Electronically Signed   By: San Morelle M.D.   On: 04/08/2018 10:25   Dg Knee Left Port  Result Date: 04/06/2018 CLINICAL DATA:  Hip fracture. EXAM: PORTABLE LEFT KNEE - 1-2 VIEW COMPARISON:  None. FINDINGS: Mild medial compartmental articular space narrowing. Mild tricompartmental spurring. The lateral projection is off axis. There may well be a knee effusion. IMPRESSION: 1. Probable knee effusion although the suprapatellar bursa is difficult to assess given the obliquity of the lateral  projection. 2. Mild osteoarthritis. Electronically Signed   By: Van Clines M.D.   On: 04/06/2018 17:50   Dg C-arm 1-60 Min  Result Date: 04/07/2018 CLINICAL DATA:  Intramedullary rod EXAM: DG C-ARM 61-120 MIN; LEFT FEMUR 2 VIEWS COMPARISON:  04/06/2018 FINDINGS: Eight low resolution intraoperative spot views of the left femur. Total fluoroscopy time was 1 minutes 32 seconds. Initial fluoroscopic images demonstrate proximal left femur fracture. Subsequent images demonstrate placement of intramedullary rod and fixating screws with anatomic alignment IMPRESSION: Intraoperative fluoroscopic assistance provided during surgical fixation of proximal left femur fracture Electronically Signed   By: Donavan Foil M.D.   On: 04/07/2018 20:38   Dg C-arm 1-60 Min  Result Date: 04/07/2018 CLINICAL DATA:  Intramedullary rod EXAM: DG C-ARM 61-120 MIN; LEFT FEMUR 2 VIEWS COMPARISON:  04/06/2018 FINDINGS: Eight low resolution intraoperative spot views of the left femur. Total fluoroscopy time was 1 minutes 32 seconds. Initial fluoroscopic images demonstrate proximal left femur fracture. Subsequent images demonstrate placement of intramedullary rod and fixating screws with anatomic alignment IMPRESSION: Intraoperative fluoroscopic assistance provided during surgical fixation of proximal left femur fracture Electronically Signed   By: Donavan Foil M.D.   On: 04/07/2018 20:38   Dg Hip Port Unilat With Pelvis 1v Left  Result Date: 04/06/2018 CLINICAL DATA:  Hip fracture. EXAM: DG HIP (WITH OR WITHOUT PELVIS) 1V PORT LEFT COMPARISON:  04/06/2018 FINDINGS: No significant change in the appearance of distal left femoral neck fracture, which involves the greater trochanter. Impaction of the distal fracture fragment, similar to the prior image. There is overlying soft tissue swelling. IMPRESSION: Impacted fracture of the distal left femoral neck, which involves the greater trochanter. Electronically Signed   By:  Fidela Salisbury M.D.   On: 04/06/2018 21:40   Dg Hip Unilat With Pelvis 2-3 Views Left  Result Date: 04/07/2018 CLINICAL DATA:  Femur fracture EXAM: DG HIP (WITH OR WITHOUT PELVIS) 2-3V LEFT COMPARISON:  04/06/2018 FINDINGS: Interval intramedullary rod and screw fixation of the fracture at the base of the left femoral neck. Hardware appears intact. There is anatomic alignment. Gas in the soft tissues consistent with recent surgery. IMPRESSION: Interval intramedullary rod and screw fixation of proximal left femur fracture with expected surgical changes Electronically Signed   By: Donavan Foil M.D.   On: 04/07/2018 20:40   Dg Hip Unilat W Or Wo Pelvis 2-3 Views Left  Result Date: 04/06/2018 CLINICAL DATA:  Left leg pain after a fall yesterday. Leg deformity. EXAM: DG HIP (WITH OR WITHOUT  PELVIS) 2-3V LEFT COMPARISON:  None. FINDINGS: There is an angulated low neck fracture of the proximal left femur which appears to involve the greater trochanter. Pelvic bones are intact. IMPRESSION: Proximal left femur fracture as described. Electronically Signed   By: Lorriane Shire M.D.   On: 04/06/2018 13:18   Dg Femur Min 2 Views Left  Result Date: 04/07/2018 CLINICAL DATA:  Intramedullary rod EXAM: DG C-ARM 61-120 MIN; LEFT FEMUR 2 VIEWS COMPARISON:  04/06/2018 FINDINGS: Eight low resolution intraoperative spot views of the left femur. Total fluoroscopy time was 1 minutes 32 seconds. Initial fluoroscopic images demonstrate proximal left femur fracture. Subsequent images demonstrate placement of intramedullary rod and fixating screws with anatomic alignment IMPRESSION: Intraoperative fluoroscopic assistance provided during surgical fixation of proximal left femur fracture Electronically Signed   By: Donavan Foil M.D.   On: 04/07/2018 20:38        Scheduled Meds: . amLODipine  10 mg Oral Daily  . enoxaparin (LOVENOX) injection  30 mg Subcutaneous Q24H  . feeding supplement (ENSURE ENLIVE)  237 mL  Oral TID BM  . folic acid  1 mg Oral Daily  . furosemide  20 mg Oral Daily  . levothyroxine  75 mcg Oral Daily  . LORazepam  0-4 mg Intravenous Q6H   Followed by  . LORazepam  0-4 mg Intravenous Q12H  . multivitamin with minerals  1 tablet Oral Daily  . thiamine  100 mg Oral Daily   Or  . thiamine  100 mg Intravenous Daily   Continuous Infusions: . sodium chloride 100 mL/hr at 04/06/18 2236  . lactated ringers 10 mL/hr at 04/07/18 2213     LOS: 2 days    Time spent: 35 minutes    Irine Seal, MD [facility] Pager [phone]  If 7PM-7AM, please contact night-coverage [email or web address] Password [facility] 04/08/2018, 12:57 PM

## 2018-04-08 NOTE — Progress Notes (Signed)
CSW acknowledges SNF consult, pending PT/OT recs.   Dainelle Hun, LCSW [phone]  

## 2018-04-09 LAB — COMPREHENSIVE METABOLIC PANEL
ALT: UNDETERMINED U/L (ref 0–44)
AST: UNDETERMINED U/L (ref 15–41)
Albumin: 2.6 g/dL — ABNORMAL LOW (ref 3.5–5.0)
Alkaline Phosphatase: 55 U/L (ref 38–126)
Anion gap: 12 (ref 5–15)
BILIRUBIN TOTAL: UNDETERMINED mg/dL (ref 0.3–1.2)
BUN: 26 mg/dL — AB (ref 8–23)
CHLORIDE: 110 mmol/L (ref 98–111)
CO2: 16 mmol/L — ABNORMAL LOW (ref 22–32)
CREATININE: 2.19 mg/dL — AB (ref 0.44–1.00)
Calcium: 8.2 mg/dL — ABNORMAL LOW (ref 8.9–10.3)
GFR, EST AFRICAN AMERICAN: 25 mL/min — AB (ref >60)
GFR, EST NON AFRICAN AMERICAN: 21 mL/min — AB (ref >60)
Glucose, Bld: 112 mg/dL — ABNORMAL HIGH (ref 70–99)
POTASSIUM: 4.6 mmol/L (ref 3.5–5.1)
Sodium: 138 mmol/L (ref 135–145)
TOTAL PROTEIN: 5.5 g/dL — AB (ref 6.5–8.1)

## 2018-04-09 LAB — PREPARE RBC (CROSSMATCH)

## 2018-04-09 LAB — CBC
HEMATOCRIT: 25.1 % — AB (ref 36.0–46.0)
Hemoglobin: 7.7 g/dL — ABNORMAL LOW (ref 12.0–15.0)
MCH: 30.6 pg (ref 26.0–34.0)
MCHC: 30.7 g/dL (ref 30.0–36.0)
MCV: 99.6 fL (ref 80.0–100.0)
PLATELETS: 267 10*3/uL (ref 150–400)
RBC: 2.52 MIL/uL — ABNORMAL LOW (ref 3.87–5.11)
RDW: 13.1 % (ref 11.5–15.5)
WBC: 8.3 10*3/uL (ref 4.0–10.5)
nRBC: 0 % (ref 0.0–0.2)

## 2018-04-09 LAB — URINE CULTURE: CULTURE: NO GROWTH

## 2018-04-09 LAB — ALT: ALT: 8 U/L (ref 0–44)

## 2018-04-09 LAB — BILIRUBIN, TOTAL: BILIRUBIN TOTAL: 0.5 mg/dL (ref 0.3–1.2)

## 2018-04-09 LAB — AST: AST: 19 U/L (ref 15–41)

## 2018-04-09 LAB — CALCIUM, IONIZED: CALCIUM, IONIZED, SERUM: 4.9 mg/dL (ref 4.5–5.6)

## 2018-04-09 LAB — HEMOGLOBIN AND HEMATOCRIT, BLOOD
HCT: 25 % — ABNORMAL LOW (ref 36.0–46.0)
Hemoglobin: 7.8 g/dL — ABNORMAL LOW (ref 12.0–15.0)

## 2018-04-09 MED ORDER — TRAZODONE HCL 50 MG PO TABS
50.0000 mg | ORAL_TABLET | Freq: Once | ORAL | Status: AC
  Administered 2018-04-10: 50 mg via ORAL
  Filled 2018-04-09: qty 1

## 2018-04-09 MED ORDER — OXYCODONE HCL 5 MG PO TABS
10.0000 mg | ORAL_TABLET | Freq: Once | ORAL | Status: AC
  Administered 2018-04-10: 10 mg via ORAL
  Filled 2018-04-09: qty 2

## 2018-04-09 MED ORDER — DIPHENHYDRAMINE HCL 25 MG PO CAPS
25.0000 mg | ORAL_CAPSULE | Freq: Once | ORAL | Status: AC
  Administered 2018-04-09: 25 mg via ORAL
  Filled 2018-04-09: qty 1

## 2018-04-09 MED ORDER — SODIUM CHLORIDE 0.9% IV SOLUTION
Freq: Once | INTRAVENOUS | Status: AC
  Administered 2018-04-09: 15:00:00 via INTRAVENOUS

## 2018-04-09 MED ORDER — SODIUM CHLORIDE 0.9% IV SOLUTION: Freq: Once | INTRAVENOUS | Status: DC

## 2018-04-09 MED ORDER — ACETAMINOPHEN 325 MG PO TABS
650.0000 mg | ORAL_TABLET | Freq: Once | ORAL | Status: AC
  Administered 2018-04-09: 650 mg via ORAL
  Filled 2018-04-09: qty 2

## 2018-04-09 NOTE — Progress Notes (Addendum)
Patient ID: Sandra Hunter, female   DOB: [birth year], 77 y.o.   MRN: [id number]     Subjective:  Patient reports pain as mild to moderate.  Patient in the chair and denies any CP or SOB  Objective:   VITALS:   Vitals:   04/08/18 0447 04/08/18 1308 04/08/18 2032 04/09/18 0339  BP: (!) 179/89 127/77 127/79 129/72  Pulse: 100 96 (!) 102 (!) 101  Resp: 18 15 19 15   Temp: 98.3 F (36.8 C) 98 F (36.7 C) 99.1 F (37.3 C) 98.1 F (36.7 C)  TempSrc: Oral Oral Oral Oral  SpO2: 96% 91% 91% 94%  Weight:      Height:        ABD soft Sensation intact distally Dorsiflexion/Plantar flexion intact Incision: dressing C/D/I and scant drainage   Lab Results  Component Value Date   WBC 8.3 04/09/2018   HGB 7.8 (L) 04/09/2018   HCT 25.0 (L) 04/09/2018   MCV 99.6 04/09/2018   PLT 267 04/09/2018   BMET    Component Value Date/Time   NA 138 04/09/2018 0205   K 4.6 04/09/2018 0205   CL 110 04/09/2018 0205   CO2 16 (L) 04/09/2018 0205   GLUCOSE 112 (H) 04/09/2018 0205   BUN 26 (H) 04/09/2018 0205   CREATININE 2.19 (H) 04/09/2018 0205   CALCIUM 8.2 (L) 04/09/2018 0205   CALCIUM 9.2 04/07/2018 0437   GFRNONAA 21 (L) 04/09/2018 0205   GFRAA 25 (L) 04/09/2018 0205     Assessment/Plan: 2 Days Post-Op   Principal Problem:   Femoral neck fracture (HCC) Active Problems:   Hypothyroidism   Essential hypertension   CKD (chronic kidney disease), stage III (HCC)   Malnutrition of moderate degree   ARF (acute renal failure) (HCC)   Closed fracture of left hip (HCC)   Advance diet Up with therapy  ABLA plan 2 units prbcs.  Continue plan per Medicine WBAT Dry dressing PRN   Lunette Stands 04/09/2018, 10:45 AM  Discussed and agree with above.    Marchia Bond, MD Cell [phone]

## 2018-04-09 NOTE — Progress Notes (Signed)
CSW attempted assessment for SNF consult, however patient had just received care from tech and was in pain, yelling and crying. CSW was notified this was not a good time via tech. CSW will attempt again when patient medically and emotionally stable tomorrow morning.   [place], [place]

## 2018-04-09 NOTE — Plan of Care (Signed)
  Problem: Health Behavior/Discharge Planning: Goal: Ability to manage health-related needs will improve Outcome: Progressing   Problem: Clinical Measurements: Goal: Ability to maintain clinical measurements within normal limits will improve Outcome: Progressing   Problem: Pain Managment: Goal: General experience of comfort will improve Outcome: Progressing   Problem: Safety: Goal: Ability to remain free from injury will improve Outcome: Progressing   

## 2018-04-09 NOTE — Progress Notes (Signed)
PROGRESS NOTE    Sandra Hunter  HYI:[id number] DOB: May 08, 1941 DOA: 04/06/2018 PCP: [name], MD    Brief Narrative:   77 y.o.femalewith medical history ofessential hypertension, CKD stage IV, hypothyroidism, rectal prolapse admitted from [facility] on 04/06/2018 with left hip fracture after mechanical fall on 04/05/2018   Assessment & Plan:   Principal Problem:   Femoral neck fracture (HCC) Active Problems:   Hypothyroidism   Essential hypertension   CKD (chronic kidney disease), stage III (HCC)   Malnutrition of moderate degree   ARF (acute renal failure) (HCC)   Closed fracture of left hip (HCC)  #1 left distal femoral neck fracture involving greater trochanter Secondary to mechanical fall.  Patient seen by orthopedics and patient underwent IM nail intertrochanteric left per Dr. Marcelino Scot 04/07/2018.  PT/OT.  Needs skilled nursing facility.  Per orthopedics.  2.  Hypothyroidism TSH noted to be elevated at 6.2.  Synthroid dose has been increased to 75 MCG's daily.  Will need repeat thyroid function studies done in about 4 to 6 weeks.  Outpatient follow-up with PCP.  3.  Hypertension Patient noted to have significantly elevated blood pressure felt likely to be elevated secondary to pain.  Patient's Norvasc has been resumed.  Blood pressure has improved.  Hydralazine as needed.  4.  Chronic kidney disease stage IV versus acute renal failure No recent renal function noted on epic.  Last creatinine noted to be in the system was 0.93 in July 2011.  Patient with urine output of 1.2 L over the past 24 hours.  Creatinine trending up and currently at 2.19 from 1.83 from 1.70.  Urine sodium is 27.  Urine creatinine 200.41.  Fractional excretion of sodium is 0.18%.  Renal ultrasound with small echogenic kidneys bilaterally which can be seen in the setting of medical renal disease.  Continue IV fluids with normal saline at 100 cc/h.  Patient to be also transfused 2 units  packed red blood cells.  Follow renal function.   5.  Anemia/iron deficiency anemia/anemia of chronic disease. Likely secondary to acute postop blood loss versus dilutional.  Patient with no overt bleeding.  Anemia panel consistent with iron deficiency anemia/anemia of chronic disease.  Hemoglobin currently at 7.7 from 10.2 on admission.  Repeat H&H of 7.8.  Transfuse 2 units packed red blood cells.     DVT prophylaxis: Lovenox Code Status: Full Family Communication: Updated patient and daughter at bedside. Disposition Plan: Skilled nursing facility when medically stable and per orthopedics.    Consultants:   Orthopedics: Dr. Marcelino Scot 04/06/2018  Procedures:   Plain films of the left knee 04/06/2018  Chest x-ray 04/06/2018  Plain films of the left hip and pelvis 04/06/2018  Plain films of the left femur 04/07/2018  Renal ultrasound 04/08/2018  IM nail left intertrochanteric 04/07/2018 per Dr. Marcelino Scot  2 units packed red blood cell transfusion pending 04/09/2018  Antimicrobials:   None   Subjective: Sitting up in chair.  Denies any chest pain no shortness of breath.  Still with complaints of left hip pain.  Patient denies any overt bleeding.   Objective: Vitals:   04/08/18 0447 04/08/18 1308 04/08/18 2032 04/09/18 0339  BP: (!) 179/89 127/77 127/79 129/72  Pulse: 100 96 (!) 102 (!) 101  Resp: 18 15 19 15   Temp: 98.3 F (36.8 C) 98 F (36.7 C) 99.1 F (37.3 C) 98.1 F (36.7 C)  TempSrc: Oral Oral Oral Oral  SpO2: 96% 91% 91% 94%  Weight:  Height:        Intake/Output Summary (Last 24 hours) at 04/09/2018 1256 Last data filed at 04/09/2018 0900 Gross per 24 hour  Intake 1520 ml  Output 1200 ml  Net 320 ml   Filed Weights   04/06/18 1143 04/07/18 1523  Weight: 60.8 kg 60.8 kg    Examination:  General exam: NAD  Respiratory system: Lungs clear to auscultation bilaterally.  No wheezes, no crackles, no rhonchi.  Respiratory effort  normal. Cardiovascular system: Regular rate and rhythm no murmurs rubs or gallops.  No JVD.  No lower extremity edema.  Gastrointestinal system: Abdomen is soft, nontender, nondistended, positive bowel sounds.  No rebound.  No guarding.  Central nervous system: Alert and oriented. No focal neurological deficits. Extremities: Symmetric 5 x 5 power. Skin: No rashes, lesions or ulcers Psychiatry: Judgement and insight appear normal. Mood & affect appropriate.     Data Reviewed: I have personally reviewed following labs and imaging studies  CBC: Recent Labs  Lab 04/06/18 1208 04/07/18 0437 04/08/18 0255 04/09/18 0205 04/09/18 0907  WBC 8.7 7.4 7.1 8.3  --   NEUTROABS 7.1  --   --   --   --   HGB 10.9* 10.2* 9.0* 7.7* 7.8*  HCT 33.5* 32.5* 29.5* 25.1* 25.0*  MCV 96.3 96.4 100.0 99.6  --   PLT 370 389 294 267  --    Basic Metabolic Panel: Recent Labs  Lab 04/06/18 1208 04/07/18 0437 04/08/18 0255 04/09/18 0205  NA 136 140 139 138  K 4.7 4.3 4.7 4.6  CL 103 110 110 110  CO2 21* 21* 20* 16*  GLUCOSE 98 112* 106* 112*  BUN 30* 26* 23 26*  CREATININE 1.77* 1.70* 1.83* 2.19*  CALCIUM 9.4 9.1  9.2 8.7* 8.2*  MG  --  1.8  --   --   PHOS  --  3.9  --   --    GFR: Estimated Creatinine Clearance: 18.4 mL/min (A) (by C-G formula based on SCr of 2.19 mg/dL (H)). Liver Function Tests: Recent Labs  Lab 04/07/18 0437 04/09/18 0205 04/09/18 0620  AST 15 QUANTITY NOT SUFFICIENT, UNABLE TO PERFORM TEST 19  ALT 12 QUANTITY NOT SUFFICIENT, UNABLE TO PERFORM TEST 8  ALKPHOS 77 55  --   BILITOT 0.8 QUANTITY NOT SUFFICIENT, UNABLE TO PERFORM TEST 0.5  PROT 6.5 5.5*  --   ALBUMIN 3.4* 2.6*  --    No results for input(s): LIPASE, AMYLASE in the last 168 hours. No results for input(s): AMMONIA in the last 168 hours. Coagulation Profile: Recent Labs  Lab 04/07/18 0437  INR 1.08   Cardiac Enzymes: No results for input(s): CKTOTAL, CKMB, CKMBINDEX, TROPONINI in the last 168  hours. BNP (last 3 results) No results for input(s): PROBNP in the last 8760 hours. HbA1C: Recent Labs    04/07/18 0437  HGBA1C 5.0   CBG: No results for input(s): GLUCAP in the last 168 hours. Lipid Profile: No results for input(s): CHOL, HDL, LDLCALC, TRIG, CHOLHDL, LDLDIRECT in the last 72 hours. Thyroid Function Tests: Recent Labs    04/07/18 0437  TSH 6.164*   Anemia Panel: Recent Labs    04/08/18 0849  VITAMINB12 429  FOLATE 12.9  FERRITIN 58  TIBC 248*  IRON 10*  RETICCTPCT 1.9   Sepsis Labs: No results for input(s): PROCALCITON, LATICACIDVEN in the last 168 hours.  Recent Results (from the past 240 hour(s))  MRSA PCR Screening     Status: None   Collection  Time: 04/06/18 11:21 PM  Result Value Ref Range Status   MRSA by PCR NEGATIVE NEGATIVE Final    Comment:        The GeneXpert MRSA Assay (FDA approved for NASAL specimens only), is one component of a comprehensive MRSA colonization surveillance program. It is not intended to diagnose MRSA infection nor to guide or monitor treatment for MRSA infections. Performed at [facility], [place] [address], [place], [place] [address]   Urine Culture     Status: None   Collection Time: 04/08/18  8:39 AM  Result Value Ref Range Status   Specimen Description URINE, RANDOM  Final   Special Requests NONE  Final   Culture   Final    NO GROWTH Performed at [facility], [place] [address] [place],  [address]    Report Status 04/09/2018 FINAL  Final         Radiology Studies: US Renal  Result Date: 04/08/2018 CLINICAL DATA:  Chronic kidney disease.  Acute renal failure. EXAM: RENAL / URINARY TRACT ULTRASOUND COMPLETE COMPARISON:  Abdominal ultrasound 12/06/2016. FINDINGS: Right Kidney: Renal measurements: 7.3 x 4.0 x 5.7 cm = volume: 166 mL. Renal parenchyma is thinned. Parenchyma is diffusely hyperechoic. No focal lesions are present. There is no stone or mass lesion. No hydronephrosis is  present. Left Kidney: Renal measurements: 6.5 x 4.0 x 4.1 cm = volume: 107 mL. Renal parenchyma is thinned. Parenchyma is diffusely hyperechoic. No focal lesions are present. There is no stone or mass lesion. No hydronephrosis is present. Bladder: Appears normal for degree of bladder distention. IMPRESSION: Small echogenic kidneys bilaterally. The finding is nonspecific, but can be seen in the setting of medical renal disease. There is no significant interval change. Electronically Signed   By: San Morelle M.D.   On: 04/08/2018 10:25   Dg C-arm 1-60 Min  Result Date: 04/07/2018 CLINICAL DATA:  Intramedullary rod EXAM: DG C-ARM 61-120 MIN; LEFT FEMUR 2 VIEWS COMPARISON:  04/06/2018 FINDINGS: Eight low resolution intraoperative spot views of the left femur. Total fluoroscopy time was 1 minutes 32 seconds. Initial fluoroscopic images demonstrate proximal left femur fracture. Subsequent images demonstrate placement of intramedullary rod and fixating screws with anatomic alignment IMPRESSION: Intraoperative fluoroscopic assistance provided during surgical fixation of proximal left femur fracture Electronically Signed   By: Donavan Foil M.D.   On: 04/07/2018 20:38   Dg C-arm 1-60 Min  Result Date: 04/07/2018 CLINICAL DATA:  Intramedullary rod EXAM: DG C-ARM 61-120 MIN; LEFT FEMUR 2 VIEWS COMPARISON:  04/06/2018 FINDINGS: Eight low resolution intraoperative spot views of the left femur. Total fluoroscopy time was 1 minutes 32 seconds. Initial fluoroscopic images demonstrate proximal left femur fracture. Subsequent images demonstrate placement of intramedullary rod and fixating screws with anatomic alignment IMPRESSION: Intraoperative fluoroscopic assistance provided during surgical fixation of proximal left femur fracture Electronically Signed   By: Donavan Foil M.D.   On: 04/07/2018 20:38   Dg Hip Unilat With Pelvis 2-3 Views Left  Result Date: 04/07/2018 CLINICAL DATA:  Femur fracture EXAM: DG  HIP (WITH OR WITHOUT PELVIS) 2-3V LEFT COMPARISON:  04/06/2018 FINDINGS: Interval intramedullary rod and screw fixation of the fracture at the base of the left femoral neck. Hardware appears intact. There is anatomic alignment. Gas in the soft tissues consistent with recent surgery. IMPRESSION: Interval intramedullary rod and screw fixation of proximal left femur fracture with expected surgical changes Electronically Signed   By: Donavan Foil M.D.   On: 04/07/2018 20:40  Dg Femur Min 2 Views Left  Result Date: 04/07/2018 CLINICAL DATA:  Intramedullary rod EXAM: DG C-ARM 61-120 MIN; LEFT FEMUR 2 VIEWS COMPARISON:  04/06/2018 FINDINGS: Eight low resolution intraoperative spot views of the left femur. Total fluoroscopy time was 1 minutes 32 seconds. Initial fluoroscopic images demonstrate proximal left femur fracture. Subsequent images demonstrate placement of intramedullary rod and fixating screws with anatomic alignment IMPRESSION: Intraoperative fluoroscopic assistance provided during surgical fixation of proximal left femur fracture Electronically Signed   By: Donavan Foil M.D.   On: 04/07/2018 20:38        Scheduled Meds: . sodium chloride   Intravenous Once  . sodium chloride   Intravenous Once  . acetaminophen  650 mg Oral Once  . amLODipine  10 mg Oral Daily  . diphenhydrAMINE  25 mg Oral Once  . enoxaparin (LOVENOX) injection  30 mg Subcutaneous Q24H  . feeding supplement (ENSURE ENLIVE)  237 mL Oral TID BM  . folic acid  1 mg Oral Daily  . furosemide  20 mg Oral Daily  . levothyroxine  75 mcg Oral Daily  . LORazepam  0-4 mg Intravenous Q12H  . multivitamin with minerals  1 tablet Oral Daily  . thiamine  100 mg Oral Daily   Or  . thiamine  100 mg Intravenous Daily   Continuous Infusions: . sodium chloride 100 mL/hr at 04/08/18 2353  . lactated ringers 10 mL/hr at 04/07/18 2213     LOS: 3 days    Time spent: 35 minutes    Irine Seal, MD [facility] Pager  [phone]  If 7PM-7AM, please contact night-coverage [email or web address] Password [facility] 04/09/2018, 12:56 PM

## 2018-04-09 NOTE — Plan of Care (Signed)
  Problem: Safety: Goal: Ability to remain free from injury will improve Outcome: Progressing   Problem: Pain Managment: Goal: General experience of comfort will improve Outcome: Progressing   Problem: Skin Integrity: Goal: Risk for impaired skin integrity will decrease Outcome: Progressing   

## 2018-04-09 NOTE — NC FL2 (Signed)
[place] MEDICAID FL2 LEVEL OF CARE SCREENING TOOL     IDENTIFICATION  Patient Name: Sandra Hunter Birthdate: 08/30/1940 Sex: female Admission Date (Current Location): 04/06/2018  [facility] and [place] Number:  [employer] and Address:  [facility], [place] [address] [place], [place] [address]      Provider Number: [id number]  Attending Physician Name and Address:  Eugenie Filler, MD  Relative Name and Phone Number:  Collier Salina (son) [phone]    Current Level of Care: Hospital Recommended Level of Care: [place] Prior Approval Number:    Date Approved/Denied: 04/09/18 PASRR Number: [id number]  Discharge Plan: SNF    Current Diagnoses: Patient Active Problem List   Diagnosis Date Noted  . ARF (acute renal failure) ([place])   . Closed fracture of left hip ([place])   . Malnutrition of moderate degree 04/07/2018  . Femoral neck fracture ([place]) 04/06/2018  . CKD (chronic kidney disease), stage III ([place]) 04/06/2018  . CELLULITIS AND ABSCESS OF UPPER ARM AND FOREARM 12/04/2009  . Hypothyroidism 03/22/2009  . MITRAL REGURGITATION 03/22/2009  . Essential hypertension 03/22/2009  . 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 UNDIAGNOSED CARDIAC MURMURS 03/22/2009    Orientation RESPIRATION BLADDER Height & Weight     Self, Time, Situation, Place  Normal Continent, External catheter Weight: 134 lb (60.8 kg) Height:  5\' 3"  (160 cm)  BEHAVIORAL SYMPTOMS/MOOD NEUROLOGICAL BOWEL NUTRITION STATUS      Continent Diet(see discharge summary)  AMBULATORY STATUS COMMUNICATION OF NEEDS Skin   Extensive Assist Verbally Surgical wounds(left hip closed surgical incision)                       Personal Care Assistance Level of Assistance  Bathing, Feeding, Dressing, Total care Bathing Assistance: Maximum assistance Feeding assistance: Independent Dressing Assistance: Maximum assistance Total Care Assistance: Maximum assistance    Functional Limitations Info  Sight, Hearing, Speech Sight Info: Adequate Hearing Info: Adequate      SPECIAL CARE FACTORS FREQUENCY  PT (By licensed PT), OT (By licensed OT)     PT Frequency: min 5x weekly OT Frequency: min 3x weekly            Contractures Contractures Info: Not present    Additional Factors Info  Allergies, Code Status Code Status Info: full Allergies Info: no known           Current Medications (04/09/2018):  This is the current hospital active medication list Current Facility-Administered Medications  Medication Dose Route Frequency Provider Last Rate Last Dose  . 0.9 %  sodium chloride infusion   Intravenous Continuous Ainsley Spinner, PA-C 100 mL/hr at 04/08/18 2353    . amLODipine (NORVASC) tablet 10 mg  10 mg Oral Daily Ainsley Spinner, PA-C   10 mg at 04/09/18 2841  . enoxaparin (LOVENOX) injection 30 mg  30 mg Subcutaneous Q24H Ainsley Spinner, PA-C   30 mg at 04/09/18 3244  . feeding supplement (ENSURE ENLIVE) (ENSURE ENLIVE) liquid 237 mL  237 mL Oral TID BM Ainsley Spinner, PA-C   237 mL at 04/09/18 0841  . folic acid (FOLVITE) tablet 1 mg  1 mg Oral Daily Ainsley Spinner, PA-C   1 mg at 04/09/18 0102  . furosemide (LASIX) tablet 20 mg  20 mg Oral Daily Ainsley Spinner, PA-C   20 mg at 04/09/18 7253  . hydrALAZINE (APRESOLINE) injection 10 mg  10 mg Intravenous Q6H PRN Ainsley Spinner, PA-C      .  HYDROcodone-acetaminophen (NORCO/VICODIN) 5-325 MG per tablet 1-2 tablet  1-2 tablet Oral Q6H PRN Ainsley Spinner, PA-C   2 tablet at 04/09/18 9198  . lactated ringers infusion   Intravenous Continuous Ainsley Spinner, PA-C 10 mL/hr at 04/07/18 2213    . levothyroxine (SYNTHROID, LEVOTHROID) tablet 75 mcg  75 mcg Oral Daily Roxan Hockey, MD   75 mcg at 04/09/18 0645  . LORazepam (ATIVAN) injection 0-4 mg  0-4 mg Intravenous Q12H Ainsley Spinner, PA-C   1 mg at 04/08/18 2056  . LORazepam (ATIVAN) tablet 1 mg  1 mg Oral Q6H PRN Ainsley Spinner, PA-C       Or  . LORazepam (ATIVAN)  injection 1 mg  1 mg Intravenous Q6H PRN Ainsley Spinner, PA-C      . morphine 2 MG/ML injection 0.5 mg  0.5 mg Intravenous Q2H PRN Ainsley Spinner, PA-C   0.5 mg at 04/09/18 0221  . multivitamin with minerals tablet 1 tablet  1 tablet Oral Daily Ainsley Spinner, PA-C   1 tablet at 04/09/18 7981  . ondansetron (ZOFRAN) injection 4 mg  4 mg Intravenous Q6H PRN Ainsley Spinner, PA-C      . thiamine (VITAMIN B-1) tablet 100 mg  100 mg Oral Daily Ainsley Spinner, PA-C   100 mg at 04/09/18 0254   Or  . thiamine (B-1) injection 100 mg  100 mg Intravenous Daily Ainsley Spinner, PA-C         Discharge Medications: Please see discharge summary for a list of discharge medications.  Relevant Imaging Results:  Relevant Lab Results:   Additional Information SSN: [id number]  Alberteen Sam, LCSW

## 2018-04-09 NOTE — Evaluation (Signed)
Physical Therapy Evaluation Patient Details Name: Sandra Hunter MRN: [id number] DOB: [birth year] Today's Date: 04/09/2018   History of Present Illness  Pt is a 77 y/o female admitted after fall. Found to have L femoral neck fracture and is s/p L IM nail. PMH includes HTN and CKD.   Clinical Impression  Pt is s/p surgery above with deficits below. Pt very limited secondary to pain. Attempted standing with RW and max A, however, pt abruptly sitting. Performed squat pivot to chair with max to total A. Pt's husband bedridden and will be unable to assist pt at home. Feel pt will require increased assist at d/c. Will continue to follow acutely to maximize functional mobility independence and safety.     Follow Up Recommendations SNF;Supervision/Assistance - 24 hour    Equipment Recommendations  None recommended by PT    Recommendations for Other Services       Precautions / Restrictions Precautions Precautions: Fall Restrictions Weight Bearing Restrictions: Yes LLE Weight Bearing: Weight bearing as tolerated      Mobility  Bed Mobility Overal bed mobility: Needs Assistance Bed Mobility: Supine to Sit     Supine to sit: Max assist     General bed mobility comments: Max A for LLE assist, scooting hips to EOB, and for trunk elevation. Increased time required.   Transfers Overall transfer level: Needs assistance Equipment used: Rolling walker (2 wheeled) Transfers: Sit to/from [employer] Sit to Stand: Max assist   Squat pivot transfers: Max assist;Total assist     General transfer comment: Attempted standing with RW. Pt requiring increased time and effort and max A for lift assist and steadying. Once standing, pt tearful and sitting back down abruptly. Attempted standing 2nd time with PT in front and pt holding on to PT's arms, however, pt unable to perform steps, so performed squat pivot to chair with max to total A.   Ambulation/Gait                 Stairs            Wheelchair Mobility    Modified Rankin (Stroke Patients Only)       Balance Overall balance assessment: Needs assistance Sitting-balance support: No upper extremity supported;Feet supported Sitting balance-Leahy Scale: Fair     Standing balance support: Bilateral upper extremity supported;During functional activity Standing balance-Leahy Scale: Poor Standing balance comment: Heavy reliance on UE and external support.                              Pertinent Vitals/Pain Pain Assessment: Faces Faces Pain Scale: Hurts whole lot Pain Location: L hip  Pain Descriptors / Indicators: Crying;Guarding;Grimacing Pain Intervention(s): Limited activity within patient's tolerance;Monitored during session;Repositioned    Home Living Family/patient expects to be discharged to:: Private residence Living Arrangements: Spouse/significant other Available Help at Discharge: Other (Comment)(spouse is bedridden) Type of Home: House Home Access: Stairs to enter     Home Layout: One level Home Equipment: Walker - 2 wheels Additional Comments: Pt reports she has steps, but was falling asleep and unable to maintain train of thought.     Prior Function Level of Independence: Independent               Hand Dominance        Extremity/Trunk Assessment   Upper Extremity Assessment Upper Extremity Assessment: Generalized weakness    Lower Extremity Assessment Lower Extremity Assessment: LLE deficits/detail LLE Deficits /  Details: Deficits consistent with post op pain and weakness.     Cervical / Trunk Assessment Cervical / Trunk Assessment: Kyphotic  Communication   Communication: No difficulties  Cognition Arousal/Alertness: Lethargic;Suspect due to medications Behavior During Therapy: Flat affect(tearful ) Overall Cognitive Status: No family/caregiver present to determine baseline cognitive functioning                                  General Comments: Pt would have periods where she would become tearful and then would stop crying suddenly. Pt falling asleep easily, however, easily arousable.       General Comments General comments (skin integrity, edema, etc.): No family present     Exercises     Assessment/Plan    PT Assessment Patient needs continued PT services  PT Problem List Decreased strength;Decreased range of motion;Decreased activity tolerance;Decreased balance;Decreased mobility;Decreased knowledge of use of DME;Decreased knowledge of precautions;Pain       PT Treatment Interventions DME instruction;Gait training;Functional mobility training;Therapeutic activities;Therapeutic exercise;Balance training;Patient/family education    PT Goals (Current goals can be found in the Care Plan section)  Acute Rehab PT Goals Patient Stated Goal: for pain to decrease PT Goal Formulation: With patient Time For Goal Achievement: 04/23/18 Potential to Achieve Goals: Fair    Frequency Min 3X/week   Barriers to discharge        Co-evaluation               AM-PAC PT "6 Clicks" Mobility  Outcome Measure Help needed turning from your back to your side while in a flat bed without using bedrails?: A Lot Help needed moving from lying on your back to sitting on the side of a flat bed without using bedrails?: A Lot Help needed moving to and from a bed to a chair (including a wheelchair)?: Total Help needed standing up from a chair using your arms (e.g., wheelchair or bedside chair)?: A Lot Help needed to walk in hospital room?: Total Help needed climbing 3-5 steps with a railing? : Total 6 Click Score: 9    End of Session Equipment Utilized During Treatment: Gait belt Activity Tolerance: Patient limited by pain;Patient limited by lethargy Patient left: in chair;with call bell/phone within reach;with chair alarm set Nurse Communication: Mobility status PT Visit Diagnosis: Other abnormalities of  gait and mobility (R26.89);Muscle weakness (generalized) (M62.81);History of falling (Z91.81);Unsteadiness on feet (R26.81);Pain Pain - Right/Left: Left Pain - part of body: Hip    Time: 2703-5009 PT Time Calculation (min) (ACUTE ONLY): 27 min   Charges:   PT Evaluation $PT Eval Moderate Complexity: 1 Mod PT Treatments $Therapeutic Activity: 8-22 mins        Leighton Ruff, PT, DPT  Acute Rehabilitation Services  Pager: [phone] Office: [phone]   Rudean Hitt 04/09/2018, 10:18 AM

## 2018-04-10 ENCOUNTER — Encounter (HOSPITAL_COMMUNITY): Payer: Self-pay | Admitting: Internal Medicine

## 2018-04-10 LAB — BASIC METABOLIC PANEL
Anion gap: 8 (ref 5–15)
BUN: 30 mg/dL — ABNORMAL HIGH (ref 8–23)
CO2: 20 mmol/L — AB (ref 22–32)
CREATININE: 2.01 mg/dL — AB (ref 0.44–1.00)
Calcium: 8.3 mg/dL — ABNORMAL LOW (ref 8.9–10.3)
Chloride: 108 mmol/L (ref 98–111)
GFR calc Af Amer: 27 mL/min — ABNORMAL LOW (ref >60)
GFR calc non Af Amer: 24 mL/min — ABNORMAL LOW (ref >60)
Glucose, Bld: 123 mg/dL — ABNORMAL HIGH (ref 70–99)
Potassium: 4.1 mmol/L (ref 3.5–5.1)
Sodium: 136 mmol/L (ref 135–145)

## 2018-04-10 LAB — CBC
HCT: 31.2 % — ABNORMAL LOW (ref 36.0–46.0)
Hemoglobin: 9.5 g/dL — ABNORMAL LOW (ref 12.0–15.0)
MCH: 28.7 pg (ref 26.0–34.0)
MCHC: 30.4 g/dL (ref 30.0–36.0)
MCV: 94.3 fL (ref 80.0–100.0)
Platelets: 297 10*3/uL (ref 150–400)
RBC: 3.31 MIL/uL — ABNORMAL LOW (ref 3.87–5.11)
RDW: 15.8 % — ABNORMAL HIGH (ref 11.5–15.5)
WBC: 9.8 10*3/uL (ref 4.0–10.5)
nRBC: 0 % (ref 0.0–0.2)

## 2018-04-10 LAB — TYPE AND SCREEN
ABO/RH(D): A POS
ANTIBODY SCREEN: NEGATIVE
UNIT DIVISION: 0
Unit division: 0

## 2018-04-10 LAB — BPAM RBC
BLOOD PRODUCT EXPIRATION DATE: 201912192359
Blood Product Expiration Date: 201912212359
ISSUE DATE / TIME: 201911281522
ISSUE DATE / TIME: 201911281757
Unit Type and Rh: 6200
Unit Type and Rh: 6200

## 2018-04-10 MED ORDER — SODIUM CHLORIDE 0.9 % IV SOLN
510.0000 mg | Freq: Once | INTRAVENOUS | Status: DC
  Filled 2018-04-10: qty 17

## 2018-04-10 MED ORDER — OXYCODONE HCL 5 MG PO TABS
5.0000 mg | ORAL_TABLET | ORAL | Status: DC | PRN
  Administered 2018-04-10 – 2018-04-13 (×14): 10 mg via ORAL
  Filled 2018-04-10 (×13): qty 2

## 2018-04-10 MED ORDER — POLYSACCHARIDE IRON COMPLEX 150 MG PO CAPS
150.0000 mg | ORAL_CAPSULE | Freq: Every day | ORAL | Status: DC
  Administered 2018-04-11 – 2018-04-16 (×6): 150 mg via ORAL
  Filled 2018-04-10 (×6): qty 1

## 2018-04-10 MED ORDER — SENNOSIDES-DOCUSATE SODIUM 8.6-50 MG PO TABS
1.0000 | ORAL_TABLET | Freq: Two times a day (BID) | ORAL | Status: DC
  Administered 2018-04-10 – 2018-04-16 (×9): 1 via ORAL
  Filled 2018-04-10 (×9): qty 1

## 2018-04-10 NOTE — Progress Notes (Addendum)
PROGRESS NOTE    Sandra Hunter  YTK:[id number] DOB: [birth year] DOA: 04/06/2018 PCP: [name], MD    Brief Narrative:   77 y.o.femalewith medical history ofessential hypertension, CKD stage IV, hypothyroidism, rectal prolapse admitted from [facility] on 04/06/2018 with left hip fracture after mechanical fall on 04/05/2018   Assessment & Plan:   Principal Problem:   Femoral neck fracture (HCC) Active Problems:   Hypothyroidism   Essential hypertension   CKD (chronic kidney disease) stage 4, GFR 15-29 ml/min (HCC)   Malnutrition of moderate degree   ARF (acute renal failure) (HCC)   Closed fracture of left hip (HCC)  #1 left distal femoral neck fracture involving greater trochanter Secondary to mechanical fall.  Patient seen by orthopedics and patient underwent IM nail intertrochanteric left per Dr. Marcelino Scot 04/07/2018.  PT/OT.  Needs skilled nursing facility.  Per orthopedics.  2.  Hypothyroidism TSH noted to be elevated at 6.2.  Synthroid dose has been increased to 75 MCG's daily.  Will need repeat thyroid function studies done in about 4 to 6 weeks.  Outpatient follow-up with PCP.  3.  Hypertension Patient noted to have significantly elevated blood pressure felt likely to be elevated secondary to pain.  Blood pressure controlled on current regimen of Norvasc.  Outpatient follow-up.   4.  Acute renal failure on chronic kidney disease stage IV  No recent renal function noted on epic.  Last creatinine noted to be in the system was 0.93 in July 2011.  Creatinine trending back down currently at 2.01 from 2.19 from 1.83 from 1.70.  Urine sodium is 27.  Urine creatinine 200.41.  Fractional excretion of sodium is 0.18%.  Renal ultrasound with small echogenic kidneys bilaterally which can be seen in the setting of medical renal disease.  Patient status post 2 units packed red blood cells 04/09/2018.  Decrease IV fluids to 75 cc an hour for the next 24 hours.  Follow.  Will  need outpatient follow-up with PCP.    5.  Anemia/iron deficiency anemia/anemia of chronic disease. Likely secondary to acute postop blood loss versus dilutional.  Patient with no overt bleeding.  Anemia panel consistent with iron deficiency anemia/anemia of chronic disease.  Hemoglobin currently at 9.5 from 7.7 from 10.2 on admission.  Staus post 2 units packed red blood cells 04/09/2018.  We will give a dose of IV iron and start oral iron supplementation on 04/11/2018.   DVT prophylaxis: Lovenox Code Status: Full Family Communication: Updated patient.  No family at bedside.  Disposition Plan: Skilled nursing facility when medically stable and per orthopedics.    Consultants:   Orthopedics: Dr. Marcelino Scot 04/06/2018  Procedures:   Plain films of the left knee 04/06/2018  Chest x-ray 04/06/2018  Plain films of the left hip and pelvis 04/06/2018  Plain films of the left femur 04/07/2018  Renal ultrasound 04/08/2018  IM nail left intertrochanteric 04/07/2018 per Dr. Marcelino Scot  2 units packed red blood cell transfusion 04/09/2018  Antimicrobials:   None   Subjective: Patient laying in bed.  States she is feeling better.  Pain is better controlled today.  No chest pain.  No shortness of breath.  No overt bleeding.    Objective: Vitals:   04/09/18 1747 04/09/18 1828 04/09/18 2026 04/10/18 0500  BP: 136/85 136/80 129/72 131/70  Pulse: 95 92 91 80  Resp: 18 16 17    Temp: 98.2 F (36.8 C) 98.1 F (36.7 C) 98.3 F (36.8 C) 98.5 F (36.9 C)  TempSrc: Oral  Oral Oral Oral  SpO2: 95% 93%  94%  Weight:      Height:        Intake/Output Summary (Last 24 hours) at 04/10/2018 1538 Last data filed at 04/10/2018 0900 Gross per 24 hour  Intake 360 ml  Output 300 ml  Net 60 ml   Filed Weights   04/06/18 1143 04/07/18 1523  Weight: 60.8 kg 60.8 kg    Examination:  General exam: NAD  Respiratory system: CTAB.  No wheezes, no crackles, no rhonchi.  Normal respiratory  effort. Cardiovascular system: RRR murmurs rubs or gallops.  No lower extremity edema.  No JVD.  Gastrointestinal system: Abdomen is nontender, nondistended, soft, positive bowel sounds.  No rebound.  No guarding.  Central nervous system: Alert and oriented. No focal neurological deficits. Extremities: Symmetric 5 x 5 power. Skin: No rashes, lesions or ulcers Psychiatry: Judgement and insight appear normal. Mood & affect appropriate.     Data Reviewed: I have personally reviewed following labs and imaging studies  CBC: Recent Labs  Lab 04/06/18 1208 04/07/18 0437 04/08/18 0255 04/09/18 0205 04/09/18 0907 04/10/18 0146  WBC 8.7 7.4 7.1 8.3  --  9.8  NEUTROABS 7.1  --   --   --   --   --   HGB 10.9* 10.2* 9.0* 7.7* 7.8* 9.5*  HCT 33.5* 32.5* 29.5* 25.1* 25.0* 31.2*  MCV 96.3 96.4 100.0 99.6  --  94.3  PLT 370 389 294 267  --  001   Basic Metabolic Panel: Recent Labs  Lab 04/06/18 1208 04/07/18 0437 04/08/18 0255 04/09/18 0205 04/10/18 0146  NA 136 140 139 138 136  K 4.7 4.3 4.7 4.6 4.1  CL 103 110 110 110 108  CO2 21* 21* 20* 16* 20*  GLUCOSE 98 112* 106* 112* 123*  BUN 30* 26* 23 26* 30*  CREATININE 1.77* 1.70* 1.83* 2.19* 2.01*  CALCIUM 9.4 9.1  9.2 8.7* 8.2* 8.3*  MG  --  1.8  --   --   --   PHOS  --  3.9  --   --   --    GFR: Estimated Creatinine Clearance: 19.7 mL/min (A) (by C-G formula based on SCr of 2.01 mg/dL (H)). Liver Function Tests: Recent Labs  Lab 04/07/18 0437 04/09/18 0205 04/09/18 0620  AST 15 QUANTITY NOT SUFFICIENT, UNABLE TO PERFORM TEST 19  ALT 12 QUANTITY NOT SUFFICIENT, UNABLE TO PERFORM TEST 8  ALKPHOS 77 55  --   BILITOT 0.8 QUANTITY NOT SUFFICIENT, UNABLE TO PERFORM TEST 0.5  PROT 6.5 5.5*  --   ALBUMIN 3.4* 2.6*  --    No results for input(s): LIPASE, AMYLASE in the last 168 hours. No results for input(s): AMMONIA in the last 168 hours. Coagulation Profile: Recent Labs  Lab 04/07/18 0437  INR 1.08   Cardiac Enzymes: No  results for input(s): CKTOTAL, CKMB, CKMBINDEX, TROPONINI in the last 168 hours. BNP (last 3 results) No results for input(s): PROBNP in the last 8760 hours. HbA1C: No results for input(s): HGBA1C in the last 72 hours. CBG: No results for input(s): GLUCAP in the last 168 hours. Lipid Profile: No results for input(s): CHOL, HDL, LDLCALC, TRIG, CHOLHDL, LDLDIRECT in the last 72 hours. Thyroid Function Tests: No results for input(s): TSH, T4TOTAL, FREET4, T3FREE, THYROIDAB in the last 72 hours. Anemia Panel: Recent Labs    04/08/18 0849  VITAMINB12 429  FOLATE 12.9  FERRITIN 58  TIBC 248*  IRON 10*  RETICCTPCT 1.9  Sepsis Labs: No results for input(s): PROCALCITON, LATICACIDVEN in the last 168 hours.  Recent Results (from the past 240 hour(s))  MRSA PCR Screening     Status: None   Collection Time: 04/06/18 11:21 PM  Result Value Ref Range Status   MRSA by PCR NEGATIVE NEGATIVE Final    Comment:        The GeneXpert MRSA Assay (FDA approved for NASAL specimens only), is one component of a comprehensive MRSA colonization surveillance program. It is not intended to diagnose MRSA infection nor to guide or monitor treatment for MRSA infections. Performed at [facility], [place] [address] [place], [place] [address]   Urine Culture     Status: None   Collection Time: 04/08/18  8:39 AM  Result Value Ref Range Status   Specimen Description URINE, RANDOM  Final   Special Requests NONE  Final   Culture   Final    NO GROWTH Performed at [facility], [place] [address], [place],  [address]    Report Status 04/09/2018 FINAL  Final         Radiology Studies: No results found.      Scheduled Meds: . amLODipine  10 mg Oral Daily  . enoxaparin (LOVENOX) injection  30 mg Subcutaneous Q24H  . feeding supplement (ENSURE ENLIVE)  237 mL Oral TID BM  . folic acid  1 mg Oral Daily  . [START ON 04/11/2018] iron polysaccharides  150 mg Oral Daily  .  levothyroxine  75 mcg Oral Daily  . LORazepam  0-4 mg Intravenous Q12H  . multivitamin with minerals  1 tablet Oral Daily  . thiamine  100 mg Oral Daily   Or  . thiamine  100 mg Intravenous Daily   Continuous Infusions: . ferumoxytol    . lactated ringers 75 mL/hr at 04/10/18 1018     LOS: 4 days    Time spent: 35 minutes    Irine Seal, MD [facility] Pager [phone]  If 7PM-7AM, please contact night-coverage [email or web address] Password [facility] 04/10/2018, 3:38 PM

## 2018-04-10 NOTE — Care Management Important Message (Signed)
Important Message  Patient Details  Name: Sandra Hunter MRN: [id number] Date of Birth: June 21, 1940   Medicare Important Message Given:  Yes    Haleigh Desmith 04/10/2018, 11:40 AM

## 2018-04-10 NOTE — Progress Notes (Addendum)
Patient ID: ANNALEESE GUIER, female   DOB: Jan 11, 1941, 77 y.o.   MRN: [id number]     Subjective:  Patient reports pain as mild to moderate.  Patient reports improvment  Objective:   VITALS:   Vitals:   04/09/18 1747 04/09/18 1828 04/09/18 2026 04/10/18 0500  BP: 136/85 136/80 129/72 131/70  Pulse: 95 92 91 80  Resp: 18 16 17    Temp: 98.2 F (36.8 C) 98.1 F (36.7 C) 98.3 F (36.8 C) 98.5 F (36.9 C)  TempSrc: Oral Oral Oral Oral  SpO2: 95% 93%  94%  Weight:      Height:        ABD soft Sensation intact distally Dorsiflexion/Plantar flexion intact Incision: dressing C/D/I and no drainage   Lab Results  Component Value Date   WBC 9.8 04/10/2018   HGB 9.5 (L) 04/10/2018   HCT 31.2 (L) 04/10/2018   MCV 94.3 04/10/2018   PLT 297 04/10/2018   BMET    Component Value Date/Time   NA 136 04/10/2018 0146   K 4.1 04/10/2018 0146   CL 108 04/10/2018 0146   CO2 20 (L) 04/10/2018 0146   GLUCOSE 123 (H) 04/10/2018 0146   BUN 30 (H) 04/10/2018 0146   CREATININE 2.01 (H) 04/10/2018 0146   CALCIUM 8.3 (L) 04/10/2018 0146   CALCIUM 9.2 04/07/2018 0437   GFRNONAA 24 (L) 04/10/2018 0146   GFRAA 27 (L) 04/10/2018 0146     Assessment/Plan: 3 Days Post-Op   Principal Problem:   Femoral neck fracture (HCC) Active Problems:   Hypothyroidism   Essential hypertension   CKD (chronic kidney disease), stage III (HCC)   Malnutrition of moderate degree   ARF (acute renal failure) (HCC)   Closed fracture of left hip (HCC)   Advance diet Up with therapy Continue plan per medicine WBAT Dry dressing PRN   Lunette Stands 04/10/2018, 8:33 AM  Discussed and agree with above.  Improved with blood.    Marchia Bond, MD Cell [phone]

## 2018-04-11 DIAGNOSIS — N184 Chronic kidney disease, stage 4 (severe): Secondary | ICD-10-CM

## 2018-04-11 LAB — BASIC METABOLIC PANEL
Anion gap: 10 (ref 5–15)
BUN: 22 mg/dL (ref 8–23)
CO2: 24 mmol/L (ref 22–32)
Calcium: 8.9 mg/dL (ref 8.9–10.3)
Chloride: 103 mmol/L (ref 98–111)
Creatinine, Ser: 1.66 mg/dL — ABNORMAL HIGH (ref 0.44–1.00)
GFR calc Af Amer: 34 mL/min — ABNORMAL LOW (ref >60)
GFR calc non Af Amer: 30 mL/min — ABNORMAL LOW (ref >60)
Glucose, Bld: 105 mg/dL — ABNORMAL HIGH (ref 70–99)
Potassium: 4.4 mmol/L (ref 3.5–5.1)
Sodium: 137 mmol/L (ref 135–145)

## 2018-04-11 LAB — HEMOGLOBIN AND HEMATOCRIT, BLOOD
HCT: 32.8 % — ABNORMAL LOW (ref 36.0–46.0)
Hemoglobin: 10.3 g/dL — ABNORMAL LOW (ref 12.0–15.0)

## 2018-04-11 NOTE — Evaluation (Signed)
Occupational Therapy Evaluation Patient Details Name: Sandra Hunter MRN: [id number] DOB: 1940-06-05 Today's Date: 04/11/2018    History of Present Illness Pt is a 77 y/o female admitted after fall. Found to have L femoral neck fracture and is s/p L IM nail. PMH includes HTN and CKD.    Clinical Impression   Pt admitted with above. She demonstrates the below listed deficits and will benefit from continued OT to maximize safety and independence with BADLs.  Pt presents to OT with impaired cognition, increased pain, generalized weakness, and decreased activity tolerance.  She requires min - total A for ADLs and  Mod A for functional transfers using stedy lift.  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  She was independent PTA, per her report.  Recommend SNF level rehab at discharge.  Will follow acutely.       Follow Up Recommendations  SNF;Supervision/Assistance - 24 hour    Equipment Recommendations  None recommended by OT    Recommendations for Other Services       Precautions / Restrictions Precautions Precautions: Fall Restrictions Weight Bearing Restrictions: Yes LLE Weight Bearing: Weight bearing as tolerated      Mobility Bed Mobility Overal bed mobility: Needs Assistance Bed Mobility: Supine to Sit     Supine to sit: Mod assist     General bed mobility comments: Pt requires increased time.  With [facility] elevated and heavy reliance on bedrails, she was able to assist with moving self to EOB.  She requires assist for Lt LE and to lift trunk   Transfers Overall transfer level: Needs assistance Equipment used: Ambulation equipment used Transfers: Sit to/from [facility] Sit to Stand: Mod assist Stand pivot transfers: Max assist       General transfer comment: Pt requires verbal cues for foot placement, and assist to boost into standing     Balance Overall balance assessment: Needs assistance Sitting-balance  support: Feet supported;Single extremity supported Sitting balance-Leahy Scale: Fair     Standing balance support: Bilateral upper extremity supported Standing balance-Leahy Scale: Poor Standing balance comment: mod A and bil. UE support                            ADL either performed or assessed with clinical judgement   ADL Overall ADL's : Needs assistance/impaired Eating/Feeding: Independent   Grooming: Wash/dry hands;Wash/dry face;Oral care;Set up;Sitting   Upper Body Bathing: Minimal assistance;Sitting   Lower Body Bathing: Maximal assistance;Sit to/from stand   Upper Body Dressing : Moderate assistance;Sitting   Lower Body Dressing: Total assistance;Sit to/from stand   Toilet Transfer: Moderate assistance;Stand-pivot;BSC Toilet Transfer Details (indicate cue type and reason): using stedy  Toileting- Clothing Manipulation and Hygiene: Total assistance;Sit to/from stand       Functional mobility during ADLs: Moderate assistance       Vision         Perception     Praxis      Pertinent Vitals/Pain Pain Assessment: Faces Faces Pain Scale: Hurts even more Pain Location: L hip  Pain Descriptors / Indicators: Operative site guarding Pain Intervention(s): Monitored during session;Limited activity within patient's tolerance;Repositioned     Hand Dominance Right   Extremity/Trunk Assessment Upper Extremity Assessment Upper Extremity Assessment: Generalized weakness   Lower Extremity Assessment Lower Extremity Assessment: Defer to PT evaluation       Communication Communication Communication: No difficulties   Cognition Arousal/Alertness: Awake/alert Behavior During  Therapy: WFL for tasks assessed/performed Overall Cognitive Status: No family/caregiver present to determine baseline cognitive functioning                                 General Comments: Pt requires information and instruction to be repeated.  She requires mod  cues for problem solving    General Comments  discussed transferring to [facility] using stedy rather than use of purewick.  Pt agreeable     Exercises     Shoulder Instructions      Home Living Family/patient expects to be discharged to:: Skilled nursing facility                                 Additional Comments: Pt lives with spouse who has dementia and is bedridden.  She reports her daughter, or a hired caregiver assist with his care       Prior Functioning/Environment Level of Independence: Independent                 OT Problem List: Decreased strength;Decreased activity tolerance;Impaired balance (sitting and/or standing);Decreased safety awareness;Decreased knowledge of use of DME or AE;Pain      OT Treatment/Interventions: Self-care/ADL training;DME and/or AE instruction;Therapeutic activities;Cognitive remediation/compensation;Patient/family education;Balance training    OT Goals(Current goals can be found in the care plan section) Acute Rehab OT Goals Patient Stated Goal: to feel better  OT Goal Formulation: With patient Time For Goal Achievement: 04/25/18 Potential to Achieve Goals: Good ADL Goals Pt Will Perform Grooming: with min assist;standing Pt Will Perform Lower Body Bathing: with min assist;sit to/from stand Pt Will Perform Lower Body Dressing: with min assist;sit to/from stand;with adaptive equipment Pt Will Transfer to Toilet: with min assist;stand pivot transfer;bedside commode Pt Will Perform Toileting - Clothing Manipulation and hygiene: with min assist;sit to/from stand  OT Frequency: Min 2X/week   Barriers to D/C: Decreased caregiver support          Co-evaluation              AM-PAC OT "6 Clicks" Daily Activity     Outcome Measure Help from another person eating meals?: None Help from another person taking care of personal grooming?: None Help from another person toileting, which includes using toliet, bedpan, or urinal?:  A Lot Help from another person bathing (including washing, rinsing, drying)?: A Lot Help from another person to put on and taking off regular upper body clothing?: A Lot Help from another person to put on and taking off regular lower body clothing?: Total 6 Click Score: 15   End of Session Equipment Utilized During Treatment: Gait belt Nurse Communication: Mobility status;Need for lift equipment  Activity Tolerance: Patient tolerated treatment well Patient left: in chair;with call bell/phone within reach;with chair alarm set  OT Visit Diagnosis: Pain Pain - Right/Left: Left Pain - part of body: Hip;Leg                Time: 6283-1517 OT Time Calculation (min): 27 min Charges:  OT General Charges $OT Visit: 1 Visit OT Evaluation $OT Eval Moderate Complexity: 1 Mod OT Treatments $Therapeutic Activity: 8-22 mins  Lucille Passy, OTR/L [place] Pager [phone] Office [phone]   Lucille Passy M 04/11/2018, 4:18 PM

## 2018-04-11 NOTE — Progress Notes (Signed)
PROGRESS NOTE    Sandra Hunter  PJK:[id number] DOB: 1941-04-26 DOA: 04/06/2018 PCP: [name], MD    Brief Narrative:   77 y.o.femalewith medical history ofessential hypertension, CKD stage IV, hypothyroidism, rectal prolapse admitted from [facility] on 04/06/2018 with left hip fracture after mechanical fall on 04/05/2018   Assessment & Plan:   Principal Problem:   Femoral neck fracture (HCC) Active Problems:   Hypothyroidism   Essential hypertension   CKD (chronic kidney disease) stage 4, GFR 15-29 ml/min (HCC)   Malnutrition of moderate degree   ARF (acute renal failure) (HCC)   Closed fracture of left hip (HCC)  #1 left distal femoral neck fracture involving greater trochanter Secondary to mechanical fall.  Patient seen by orthopedics and patient underwent IM nail intertrochanteric left per Dr. Marcelino Scot 04/07/2018.  PT/OT.  Needs skilled nursing facility.  Per orthopedics.  2.  Hypothyroidism TSH noted to be elevated at 6.2.  Synthroid dose has been increased to 75 MCG's daily.  Will need repeat thyroid function studies done in about 4 to 6 weeks.  Outpatient follow-up with PCP.  3.  Hypertension Patient noted to have significantly elevated blood pressure felt likely to be elevated secondary to pain.  Blood pressure improved on current regimen of Norvasc.  Outpatient follow-up.   4.  Acute renal failure on chronic kidney disease stage IV  No recent renal function noted on epic.  Last creatinine noted to be in the system was 0.93 in July 2011.  Creatinine trending back down currently at 1.66 from 2.01 from 2.19 from 1.83 from 1.70.  Urine sodium is 27.  Urine creatinine 200.41.  Fractional excretion of sodium is 0.18%.  Renal ultrasound with small echogenic kidneys bilaterally which can be seen in the setting of medical renal disease.  Patient status post 2 units packed red blood cells 04/09/2018.  Decreased IV fluids to 50 cc an hour for the next 24 hours.   Follow.  Will need outpatient follow-up with PCP.    5.  Anemia/iron deficiency anemia/anemia of chronic disease. Likely secondary to acute postop blood loss versus dilutional.  Patient with no overt bleeding.  Anemia panel consistent with iron deficiency anemia/anemia of chronic disease.  Hemoglobin currently at 10.3 from 9.5 from 7.7 from 10.2 on admission.  Staus post 2 units packed red blood cells 04/09/2018.  Status post IV iron.  Will start oral iron supplementation today 04/11/2018.    DVT prophylaxis: Lovenox Code Status: Full Family Communication: Updated patient.  No family at bedside.  Disposition Plan: Skilled nursing facility when medically stable and per orthopedics.    Consultants:   Orthopedics: Dr. Marcelino Scot 04/06/2018  Procedures:   Plain films of the left knee 04/06/2018  Chest x-ray 04/06/2018  Plain films of the left hip and pelvis 04/06/2018  Plain films of the left femur 04/07/2018  Renal ultrasound 04/08/2018  IM nail left intertrochanteric 04/07/2018 per Dr. Marcelino Scot  2 units packed red blood cell transfusion 04/09/2018  Antimicrobials:   None   Subjective: Patient sitting up in chair.  On the telephone.  States pain is improving daily.  No chest pain.  No shortness of breath.    Objective: Vitals:   04/10/18 1948 04/10/18 2046 04/11/18 0400 04/11/18 1248  BP: (!) 147/81  130/76 131/84  Pulse: 100 (!) 104 90 (!) 109  Resp: 18  18 19   Temp: 99.3 F (37.4 C)  98.4 F (36.9 C) 98.3 F (36.8 C)  TempSrc: Oral  Oral Oral  SpO2: 91% 93% 96% 96%  Weight:      Height:        Intake/Output Summary (Last 24 hours) at 04/11/2018 1335 Last data filed at 04/11/2018 1212 Gross per 24 hour  Intake 240 ml  Output 2700 ml  Net -2460 ml   Filed Weights   04/06/18 1143 04/07/18 1523  Weight: 60.8 kg 60.8 kg    Examination:  General exam: NAD  Respiratory system: Lungs clear to auscultation bilaterally.  No wheezes, no crackles, no rhonchi.  Normal  respiratory effort.  Cardiovascular system: Regular rate and rhythm no murmurs rubs or gallops.  No lower extremity edema.  No JVD. Gastrointestinal system: Abdomen is soft, nontender, nondistended, positive bowel sounds.  No rebound.  No guarding.  Central nervous system: Alert and oriented. No focal neurological deficits. Extremities: Symmetric 5 x 5 power. Skin: No rashes, lesions or ulcers Psychiatry: Judgement and insight appear normal. Mood & affect appropriate.     Data Reviewed: I have personally reviewed following labs and imaging studies  CBC: Recent Labs  Lab 04/06/18 1208 04/07/18 0437 04/08/18 0255 04/09/18 0205 04/09/18 0907 04/10/18 0146 04/11/18 0503  WBC 8.7 7.4 7.1 8.3  --  9.8  --   NEUTROABS 7.1  --   --   --   --   --   --   HGB 10.9* 10.2* 9.0* 7.7* 7.8* 9.5* 10.3*  HCT 33.5* 32.5* 29.5* 25.1* 25.0* 31.2* 32.8*  MCV 96.3 96.4 100.0 99.6  --  94.3  --   PLT 370 389 294 267  --  297  --    Basic Metabolic Panel: Recent Labs  Lab 04/07/18 0437 04/08/18 0255 04/09/18 0205 04/10/18 0146 04/11/18 0503  NA 140 139 138 136 137  K 4.3 4.7 4.6 4.1 4.4  CL 110 110 110 108 103  CO2 21* 20* 16* 20* 24  GLUCOSE 112* 106* 112* 123* 105*  BUN 26* 23 26* 30* 22  CREATININE 1.70* 1.83* 2.19* 2.01* 1.66*  CALCIUM 9.1  9.2 8.7* 8.2* 8.3* 8.9  MG 1.8  --   --   --   --   PHOS 3.9  --   --   --   --    GFR: Estimated Creatinine Clearance: 23.9 mL/min (A) (by C-G formula based on SCr of 1.66 mg/dL (H)). Liver Function Tests: Recent Labs  Lab 04/07/18 0437 04/09/18 0205 04/09/18 0620  AST 15 QUANTITY NOT SUFFICIENT, UNABLE TO PERFORM TEST 19  ALT 12 QUANTITY NOT SUFFICIENT, UNABLE TO PERFORM TEST 8  ALKPHOS 77 55  --   BILITOT 0.8 QUANTITY NOT SUFFICIENT, UNABLE TO PERFORM TEST 0.5  PROT 6.5 5.5*  --   ALBUMIN 3.4* 2.6*  --    No results for input(s): LIPASE, AMYLASE in the last 168 hours. No results for input(s): AMMONIA in the last 168  hours. Coagulation Profile: Recent Labs  Lab 04/07/18 0437  INR 1.08   Cardiac Enzymes: No results for input(s): CKTOTAL, CKMB, CKMBINDEX, TROPONINI in the last 168 hours. BNP (last 3 results) No results for input(s): PROBNP in the last 8760 hours. HbA1C: No results for input(s): HGBA1C in the last 72 hours. CBG: No results for input(s): GLUCAP in the last 168 hours. Lipid Profile: No results for input(s): CHOL, HDL, LDLCALC, TRIG, CHOLHDL, LDLDIRECT in the last 72 hours. Thyroid Function Tests: No results for input(s): TSH, T4TOTAL, FREET4, T3FREE, THYROIDAB in the last 72 hours. Anemia Panel: No results for input(s): VITAMINB12, FOLATE, FERRITIN,  TIBC, IRON, RETICCTPCT in the last 72 hours. Sepsis Labs: No results for input(s): PROCALCITON, LATICACIDVEN in the last 168 hours.  Recent Results (from the past 240 hour(s))  MRSA PCR Screening     Status: None   Collection Time: 04/06/18 11:21 PM  Result Value Ref Range Status   MRSA by PCR NEGATIVE NEGATIVE Final    Comment:        The GeneXpert MRSA Assay (FDA approved for NASAL specimens only), is one component of a comprehensive MRSA colonization surveillance program. It is not intended to diagnose MRSA infection nor to guide or monitor treatment for MRSA infections. Performed at [facility], [place] [address], [place], [place] [address]   Urine Culture     Status: None   Collection Time: 04/08/18  8:39 AM  Result Value Ref Range Status   Specimen Description URINE, RANDOM  Final   Special Requests NONE  Final   Culture   Final    NO GROWTH Performed at [facility], [place] [address], [place], [place] [address]    Report Status 04/09/2018 FINAL  Final         Radiology Studies: No results found.      Scheduled Meds: . amLODipine  10 mg Oral Daily  . enoxaparin (LOVENOX) injection  30 mg Subcutaneous Q24H  . feeding supplement (ENSURE ENLIVE)  237 mL Oral TID BM  . folic acid  1 mg Oral  Daily  . iron polysaccharides  150 mg Oral Daily  . levothyroxine  75 mcg Oral Daily  . multivitamin with minerals  1 tablet Oral Daily  . senna-docusate  1 tablet Oral BID  . thiamine  100 mg Oral Daily   Or  . thiamine  100 mg Intravenous Daily   Continuous Infusions: . ferumoxytol    . lactated ringers 75 mL/hr at 04/11/18 0034     LOS: 5 days    Time spent: 35 minutes    Irine Seal, MD [facility] Pager [phone]  If 7PM-7AM, please contact night-coverage [email or web address] Password [email or web address] 04/11/2018, 1:35 PM

## 2018-04-11 NOTE — Progress Notes (Signed)
SPORTS MEDICINE AND JOINT REPLACEMENT  Lara Mulch, MD    Carlyon Shadow, PA-C [place], [place], [place]  [address]                             [phone]   PROGRESS NOTE  Subjective:   Patient resting comfortably in bed, pain is improving   Tolerating Diet: yes         Patient reports pain as 3 on 0-10 scale.    Objective: Vital signs in last 24 hours:    Patient Vitals for the past 24 hrs:  BP Temp Temp src Pulse Resp SpO2  04/11/18 0400 130/76 98.4 F (36.9 C) Oral 90 18 96 %  04/10/18 2046 - - - (!) 104 - 93 %  04/10/18 1948 (!) 147/81 99.3 F (37.4 C) Oral 100 18 91 %    @flow {1959:LAST@   Intake/Output from previous day:   11/29 0701 - 11/30 0700 In: 120 [P.O.:120] Out: 2200 [Urine:2200]   Intake/Output this shift:   No intake/output data recorded.   Intake/Output      11/29 0701 - 11/30 0700 11/30 0701 - 12/01 0700   P.O. 120    Total Intake(mL/kg) 120 (2)    Urine (mL/kg/hr) 2200 (1.5)    Total Output 2200    Net -2080            LABORATORY DATA: Recent Labs    04/06/18 1208 04/07/18 0437 04/08/18 0255 04/09/18 0205 04/09/18 0907 04/10/18 0146 04/11/18 0503  WBC 8.7 7.4 7.1 8.3  --  9.8  --   HGB 10.9* 10.2* 9.0* 7.7* 7.8* 9.5* 10.3*  HCT 33.5* 32.5* 29.5* 25.1* 25.0* 31.2* 32.8*  PLT 370 389 294 267  --  297  --    Recent Labs    04/06/18 1208 04/07/18 0437 04/08/18 0255 04/09/18 0205 04/10/18 0146 04/11/18 0503  NA 136 140 139 138 136 137  K 4.7 4.3 4.7 4.6 4.1 4.4  CL 103 110 110 110 108 103  CO2 21* 21* 20* 16* 20* 24  BUN 30* 26* 23 26* 30* 22  CREATININE 1.77* 1.70* 1.83* 2.19* 2.01* 1.66*  GLUCOSE 98 112* 106* 112* 123* 105*  CALCIUM 9.4 9.1  9.2 8.7* 8.2* 8.3* 8.9   Lab Results  Component Value Date   INR 1.08 04/07/2018    Examination:  General appearance: alert, cooperative and no distress Extremities: extremities normal, atraumatic, no cyanosis or edema  Wound Exam: clean, dry, intact    Drainage:  None: wound tissue dry  Motor Exam: Quadriceps and Hamstrings Intact  Sensory Exam: Superficial Peroneal, Deep Peroneal and Tibial normal   Assessment:    4 Days Post-Op  Procedure(s) (LRB): INTRAMEDULLARY (IM) NAIL INTERTROCHANTRIC (Left)  ADDITIONAL DIAGNOSIS:  Principal Problem:   Femoral neck fracture (HCC) Active Problems:   Hypothyroidism   Essential hypertension   CKD (chronic kidney disease) stage 4, GFR 15-29 ml/min (HCC)   Malnutrition of moderate degree   ARF (acute renal failure) (HCC)   Closed fracture of left hip (HCC)     Plan: Physical Therapy as ordered Weight Bearing as Tolerated (WBAT)  DVT Prophylaxis:  Lovenox  Advance diet  Up with therapy  Continue plan per medicine   Donia Ast 04/11/2018, 7:57 AM

## 2018-04-12 LAB — BASIC METABOLIC PANEL
Anion gap: 11 (ref 5–15)
BUN: 26 mg/dL — ABNORMAL HIGH (ref 8–23)
CO2: 22 mmol/L (ref 22–32)
Calcium: 8.8 mg/dL — ABNORMAL LOW (ref 8.9–10.3)
Chloride: 102 mmol/L (ref 98–111)
Creatinine, Ser: 1.86 mg/dL — ABNORMAL HIGH (ref 0.44–1.00)
GFR calc Af Amer: 30 mL/min — ABNORMAL LOW (ref >60)
GFR calc non Af Amer: 26 mL/min — ABNORMAL LOW (ref >60)
Glucose, Bld: 111 mg/dL — ABNORMAL HIGH (ref 70–99)
Potassium: 4.8 mmol/L (ref 3.5–5.1)
Sodium: 135 mmol/L (ref 135–145)

## 2018-04-12 LAB — HEMOGLOBIN AND HEMATOCRIT, BLOOD
HCT: 30.8 % — ABNORMAL LOW (ref 36.0–46.0)
Hemoglobin: 9.6 g/dL — ABNORMAL LOW (ref 12.0–15.0)

## 2018-04-12 MED ORDER — ZOLPIDEM TARTRATE 5 MG PO TABS
5.0000 mg | ORAL_TABLET | Freq: Once | ORAL | Status: AC
  Administered 2018-04-12: 5 mg via ORAL
  Filled 2018-04-12: qty 1

## 2018-04-12 NOTE — Plan of Care (Signed)

## 2018-04-12 NOTE — Progress Notes (Signed)
PROGRESS NOTE    Sandra Hunter  MOQ:[id number] DOB: May 14, 1940 DOA: 04/06/2018 PCP: [name], MD    Brief Narrative:   77 y.o.femalewith medical history ofessential hypertension, CKD stage IV, hypothyroidism, rectal prolapse admitted from [facility] on 04/06/2018 with left hip fracture after mechanical fall on 04/05/2018   Assessment & Plan:   Principal Problem:   Femoral neck fracture (HCC) Active Problems:   Hypothyroidism   Essential hypertension   CKD (chronic kidney disease) stage 4, GFR 15-29 ml/min (HCC)   Malnutrition of moderate degree   ARF (acute renal failure) (HCC)   Closed fracture of left hip (HCC)  #1 left distal femoral neck fracture involving greater trochanter Secondary to mechanical fall.  Patient seen by orthopedics and patient underwent IM nail intertrochanteric left per Dr. Marcelino Scot 04/07/2018.  PT/OT.  Needs skilled nursing facility.  Per orthopedics.  2.  Hypothyroidism TSH noted to be elevated at 6.2.  Synthroid dose has been increased to 75 MCG's daily.  Outpatient follow-up will need thyroid function studies done in 4 to 6 weeks.   3.  Hypertension Patient noted to have significantly elevated blood pressure felt likely to be elevated secondary to pain.  BP improved on Norvasc. Outpatient follow-up.   4.  Acute renal failure on chronic kidney disease stage IV  No recent renal function noted on epic.  Last creatinine noted to be in the system was 0.93 in July 2011.  Creatinine fluctuating currently at 1.86 from 1.66 from 2.01 from 2.19 from 1.83 from 1.70.  Urine sodium is 27.  Urine creatinine 200.41.  Fractional excretion of sodium is 0.18%.  Renal ultrasound with small echogenic kidneys bilaterally which can be seen in the setting of medical renal disease.  Patient status post 2 units packed red blood cells 04/09/2018.  Saline lock IV fluids.  Outpatient follow-up with PCP.  5.  Anemia/iron deficiency anemia/anemia of chronic  disease. Likely secondary to acute postop blood loss versus dilutional.  Patient with no overt bleeding.  Anemia panel consistent with iron deficiency anemia/anemia of chronic disease.  Hemoglobin currently at 9.6 from 10.3 from 9.5 from 7.7 from 10.2 on admission.  Staus post 2 units packed red blood cells 04/09/2018.  Status post IV iron.  Continue oral iron supplementation.  Follow H&H.  Outpatient follow-up.    DVT prophylaxis: Lovenox Code Status: Full Family Communication: Updated patient.  No family at bedside.  Disposition Plan: Skilled nursing facility when medically stable and per orthopedics.    Consultants:   Orthopedics: Dr. Marcelino Scot 04/06/2018  Procedures:   Plain films of the left knee 04/06/2018  Chest x-ray 04/06/2018  Plain films of the left hip and pelvis 04/06/2018  Plain films of the left femur 04/07/2018  Renal ultrasound 04/08/2018  IM nail left intertrochanteric 04/07/2018 per Dr. Marcelino Scot  2 units packed red blood cell transfusion 04/09/2018  Antimicrobials:   None   Subjective: Patient in bed.  Watching television.  Denies any chest pain or shortness of breath.  Complaining of some right foot pain.    Objective: Vitals:   04/11/18 0400 04/11/18 1248 04/11/18 2049 04/12/18 0500  BP: 130/76 131/84 (!) 141/77 136/73  Pulse: 90 (!) 109 99 99  Resp: 18 19 14 16   Temp: 98.4 F (36.9 C) 98.3 F (36.8 C) 98.5 F (36.9 C) 98.4 F (36.9 C)  TempSrc: Oral Oral Oral Oral  SpO2: 96% 96% 96% 95%  Weight:      Height:  Intake/Output Summary (Last 24 hours) at 04/12/2018 1414 Last data filed at 04/12/2018 0855 Gross per 24 hour  Intake 720 ml  Output 500 ml  Net 220 ml   Filed Weights   04/06/18 1143 04/07/18 1523  Weight: 60.8 kg 60.8 kg    Examination:  General exam: NAD  Respiratory system: CTAB.  Normal respiratory effort.  Cardiovascular system: RRR no murmurs rubs or gallops.  No JVD.  No lower extremity edema.   Gastrointestinal  system: Abdomen is nontender, no, soft, positive bowel sounds.  No rebound.  No guarding.  Central nervous system: Alert and oriented. No focal neurological deficits. Extremities: Symmetric 5 x 5 power. Skin: No rashes, lesions or ulcers Psychiatry: Judgement and insight appear normal. Mood & affect appropriate.     Data Reviewed: I have personally reviewed following labs and imaging studies  CBC: Recent Labs  Lab 04/06/18 1208 04/07/18 0437 04/08/18 0255 04/09/18 0205 04/09/18 0907 04/10/18 0146 04/11/18 0503 04/12/18 0729  WBC 8.7 7.4 7.1 8.3  --  9.8  --   --   NEUTROABS 7.1  --   --   --   --   --   --   --   HGB 10.9* 10.2* 9.0* 7.7* 7.8* 9.5* 10.3* 9.6*  HCT 33.5* 32.5* 29.5* 25.1* 25.0* 31.2* 32.8* 30.8*  MCV 96.3 96.4 100.0 99.6  --  94.3  --   --   PLT 370 389 294 267  --  297  --   --    Basic Metabolic Panel: Recent Labs  Lab 04/07/18 0437 04/08/18 0255 04/09/18 0205 04/10/18 0146 04/11/18 0503 04/12/18 0729  NA 140 139 138 136 137 135  K 4.3 4.7 4.6 4.1 4.4 4.8  CL 110 110 110 108 103 102  CO2 21* 20* 16* 20* 24 22  GLUCOSE 112* 106* 112* 123* 105* 111*  BUN 26* 23 26* 30* 22 26*  CREATININE 1.70* 1.83* 2.19* 2.01* 1.66* 1.86*  CALCIUM 9.1  9.2 8.7* 8.2* 8.3* 8.9 8.8*  MG 1.8  --   --   --   --   --   PHOS 3.9  --   --   --   --   --    GFR: Estimated Creatinine Clearance: 21.3 mL/min (A) (by C-G formula based on SCr of 1.86 mg/dL (H)). Liver Function Tests: Recent Labs  Lab 04/07/18 0437 04/09/18 0205 04/09/18 0620  AST 15 QUANTITY NOT SUFFICIENT, UNABLE TO PERFORM TEST 19  ALT 12 QUANTITY NOT SUFFICIENT, UNABLE TO PERFORM TEST 8  ALKPHOS 77 55  --   BILITOT 0.8 QUANTITY NOT SUFFICIENT, UNABLE TO PERFORM TEST 0.5  PROT 6.5 5.5*  --   ALBUMIN 3.4* 2.6*  --    No results for input(s): LIPASE, AMYLASE in the last 168 hours. No results for input(s): AMMONIA in the last 168 hours. Coagulation Profile: Recent Labs  Lab 04/07/18 0437  INR  1.08   Cardiac Enzymes: No results for input(s): CKTOTAL, CKMB, CKMBINDEX, TROPONINI in the last 168 hours. BNP (last 3 results) No results for input(s): PROBNP in the last 8760 hours. HbA1C: No results for input(s): HGBA1C in the last 72 hours. CBG: No results for input(s): GLUCAP in the last 168 hours. Lipid Profile: No results for input(s): CHOL, HDL, LDLCALC, TRIG, CHOLHDL, LDLDIRECT in the last 72 hours. Thyroid Function Tests: No results for input(s): TSH, T4TOTAL, FREET4, T3FREE, THYROIDAB in the last 72 hours. Anemia Panel: No results for input(s): VITAMINB12, FOLATE, FERRITIN,  TIBC, IRON, RETICCTPCT in the last 72 hours. Sepsis Labs: No results for input(s): PROCALCITON, LATICACIDVEN in the last 168 hours.  Recent Results (from the past 240 hour(s))  MRSA PCR Screening     Status: None   Collection Time: 04/06/18 11:21 PM  Result Value Ref Range Status   MRSA by PCR NEGATIVE NEGATIVE Final    Comment:        The GeneXpert MRSA Assay (FDA approved for NASAL specimens only), is one component of a comprehensive MRSA colonization surveillance program. It is not intended to diagnose MRSA infection nor to guide or monitor treatment for MRSA infections. Performed at [facility], [place] [address], [place], [place] [address]   Urine Culture     Status: None   Collection Time: 04/08/18  8:39 AM  Result Value Ref Range Status   Specimen Description URINE, RANDOM  Final   Special Requests NONE  Final   Culture   Final    NO GROWTH Performed at [facility], [place] [address], [place], [place] [address]    Report Status 04/09/2018 FINAL  Final         Radiology Studies: No results found.      Scheduled Meds: . amLODipine  10 mg Oral Daily  . enoxaparin (LOVENOX) injection  30 mg Subcutaneous Q24H  . feeding supplement (ENSURE ENLIVE)  237 mL Oral TID BM  . folic acid  1 mg Oral Daily  . iron polysaccharides  150 mg Oral Daily  . levothyroxine  75  mcg Oral Daily  . multivitamin with minerals  1 tablet Oral Daily  . senna-docusate  1 tablet Oral BID  . thiamine  100 mg Oral Daily   Or  . thiamine  100 mg Intravenous Daily   Continuous Infusions: . ferumoxytol       LOS: 6 days    Time spent: 35 minutes    Irine Seal, MD [facility] Pager [phone]  If 7PM-7AM, please contact night-coverage [email or web address] Password [email or web address] 04/12/2018, 2:14 PM

## 2018-04-12 NOTE — Plan of Care (Signed)
  Problem: Health Behavior/Discharge Planning: Goal: Ability to manage health-related needs will improve Outcome: Progressing   Problem: Clinical Measurements: Goal: Ability to maintain clinical measurements within normal limits will improve Outcome: Progressing Goal: Will remain free from infection Outcome: Progressing   Problem: Activity: Goal: Risk for activity intolerance will decrease Outcome: Progressing   Problem: Nutrition: Goal: Adequate nutrition will be maintained Outcome: Progressing   Problem: Coping: Goal: Level of anxiety will decrease Outcome: Progressing   Problem: Elimination: Goal: Will not experience complications related to bowel motility Outcome: Progressing Goal: Will not experience complications related to urinary retention Outcome: Progressing   Problem: Pain Managment: Goal: General experience of comfort will improve Outcome: Progressing   Problem: Safety: Goal: Ability to remain free from injury will improve Outcome: Progressing   Problem: Skin Integrity: Goal: Risk for impaired skin integrity will decrease Outcome: Progressing   

## 2018-04-13 ENCOUNTER — Encounter (HOSPITAL_COMMUNITY): Payer: Self-pay | Admitting: Orthopedic Surgery

## 2018-04-13 LAB — BASIC METABOLIC PANEL
Anion gap: 2 — ABNORMAL LOW (ref 5–15)
BUN: 26 mg/dL — ABNORMAL HIGH (ref 8–23)
CO2: 30 mmol/L (ref 22–32)
Calcium: 8.4 mg/dL — ABNORMAL LOW (ref 8.9–10.3)
Chloride: 102 mmol/L (ref 98–111)
Creatinine, Ser: 1.78 mg/dL — ABNORMAL HIGH (ref 0.44–1.00)
GFR calc Af Amer: 32 mL/min — ABNORMAL LOW (ref >60)
GFR calc non Af Amer: 27 mL/min — ABNORMAL LOW (ref >60)
Glucose, Bld: 110 mg/dL — ABNORMAL HIGH (ref 70–99)
Potassium: 4.5 mmol/L (ref 3.5–5.1)
Sodium: 134 mmol/L — ABNORMAL LOW (ref 135–145)

## 2018-04-13 LAB — HEMOGLOBIN AND HEMATOCRIT, BLOOD
HCT: 27.7 % — ABNORMAL LOW (ref 36.0–46.0)
Hemoglobin: 8.9 g/dL — ABNORMAL LOW (ref 12.0–15.0)

## 2018-04-13 MED ORDER — ACETAMINOPHEN 325 MG PO TABS
650.0000 mg | ORAL_TABLET | Freq: Three times a day (TID) | ORAL | Status: DC
  Administered 2018-04-13 – 2018-04-16 (×9): 650 mg via ORAL
  Filled 2018-04-13 (×10): qty 2

## 2018-04-13 MED ORDER — CALCIUM CITRATE 950 (200 CA) MG PO TABS
200.0000 mg | ORAL_TABLET | Freq: Two times a day (BID) | ORAL | Status: DC
  Administered 2018-04-13 – 2018-04-16 (×7): 200 mg via ORAL
  Filled 2018-04-13 (×8): qty 1

## 2018-04-13 MED ORDER — HYDROCODONE-ACETAMINOPHEN 7.5-325 MG PO TABS
1.0000 | ORAL_TABLET | ORAL | Status: DC | PRN
  Administered 2018-04-13 – 2018-04-16 (×16): 1 via ORAL
  Filled 2018-04-13 (×16): qty 1

## 2018-04-13 MED ORDER — HYDROXYZINE HCL 10 MG PO TABS
10.0000 mg | ORAL_TABLET | Freq: Three times a day (TID) | ORAL | Status: DC | PRN
  Administered 2018-04-13 – 2018-04-15 (×3): 10 mg via ORAL
  Filled 2018-04-13 (×4): qty 1

## 2018-04-13 NOTE — Progress Notes (Signed)
Orthopedic Trauma Service Progress Note  Patient ID: Sandra Hunter MRN: [id number] DOB/AGE: [birth year] 77 y.o.  Subjective:  Patient reports pain as mild to moderate.    77 year old female patient status post op day six from left IM nail intertrochanteric placement following a left basicervical femoral neck fracture involving the greater trochanter. Patient is lying comfortably in bed today with minimal complaints of pain. States her pain has been well controlled with medications but she has not been ambulatory for the past several days. No complaints of SOB, chest pain, increased pain, swelling or redness of incision site since her surgery.    ROS As above   Objective:   VITALS:   Vitals:   04/12/18 0500 04/12/18 1517 04/12/18 2318 04/13/18 0359  BP: 136/73 139/80 118/65 (!) 146/76  Pulse: 99 96 (!) 103 96  Resp: 16 17  16   Temp: 98.4 F (36.9 C) 99.1 F (37.3 C) 99.3 F (37.4 C) 98.9 F (37.2 C)  TempSrc: Oral Oral Oral Oral  SpO2: 95% 92% 90% 92%  Weight:      Height:        Estimated body mass index is 23.74 kg/m as calculated from the following:   Height as of this encounter: 5\' 3"  (1.6 m).   Weight as of this encounter: 60.8 kg.   Intake/Output      12/01 0701 - 12/02 0700 12/02 0701 - 12/03 0700   P.O. 960    Total Intake(mL/kg) 960 (15.8)    Urine (mL/kg/hr) 700 (0.5)    Total Output 700    Net +260           LABS  Results for orders placed or performed during the hospital encounter of 04/06/18 (from the past 24 hour(s))  Hemoglobin and hematocrit, blood     Status: Abnormal   Collection Time: 04/13/18  3:19 AM  Result Value Ref Range   Hemoglobin 8.9 (L) 12.0 - 15.0 g/dL   HCT 27.7 (L) 36.0 - 71.6 %  Basic metabolic panel     Status: Abnormal   Collection Time: 04/13/18  3:19 AM  Result Value Ref Range   Sodium 134 (L) 135 - 145 mmol/L   Potassium 4.5 3.5 - 5.1  mmol/L   Chloride 102 98 - 111 mmol/L   CO2 30 22 - 32 mmol/L   Glucose, Bld 110 (H) 70 - 99 mg/dL   BUN 26 (H) 8 - 23 mg/dL   Creatinine, Ser 1.78 (H) 0.44 - 1.00 mg/dL   Calcium 8.4 (L) 8.9 - 10.3 mg/dL   GFR calc non Af Amer 27 (L) >60 mL/min   GFR calc Af Amer 32 (L) >60 mL/min   Anion gap 2 (L) 5 - 15     PHYSICAL EXAM:     Gen: Patient lying in bed comfortably, NAD.  Lungs: clear to ascultation bilaterally.  Cardiac: tachycardic, +S1 and +S2 to ascultation without murmurs, rubs or gallops.  Ext:   Right Lower Extremity: no signs of swelling, full active ROM of hip, knee and ankle without pain. Neurovascularly intact with +2 dorsalis pedis pulses. Sensation intact to light touch on the lateral, medial and plantar aspects of the right foot.   Left Lower Extremity: mild nonpitting edema of the left lower extremity. No associated calf pain. Neurovascularly  intact with +2 dorsalis pedis pulses and sensation intact to light touch on the lateral, medial and plantar aspects of the left foot. Full active range of motion of her left ankle with 5/5 strength. Incisions at left hip show no signs of infection including redness, warmth or swelling. Incisions are covered with dry dressing. Some mild ecchymosis noted around her superior incision site.  EHL, FHL, AT, PT, peroneals and gastrocsoleus complex motor grossly intact.   Assessment/Plan: 6 Days Post-Op   Principal Problem:   Femoral neck fracture (HCC) Active Problems:   Hypothyroidism   Essential hypertension   CKD (chronic kidney disease) stage 4, GFR 15-29 ml/min (HCC)   Malnutrition of moderate degree   ARF (acute renal failure) (HCC)   Closed fracture of left hip (HCC)   Anti-infectives (From admission, onward)   Start     Dose/Rate Route Frequency Ordered Stop   04/08/18 0500  ceFAZolin (ANCEF) IVPB 1 g/50 mL premix     1 g 100 mL/hr over 30 Minutes Intravenous  Once 04/07/18 2037 04/08/18 0539   04/07/18 2300  ceFAZolin  (ANCEF) IVPB 1 g/50 mL premix  Status:  Discontinued     1 g 100 mL/hr over 30 Minutes Intravenous Every 8 hours 04/07/18 2020 04/07/18 2037   04/07/18 0730  ceFAZolin (ANCEF) IVPB 2g/100 mL premix     2 g 200 mL/hr over 30 Minutes Intravenous On call to O.R. 04/06/18 1918 04/07/18 1714   04/06/18 2200  amoxicillin (AMOXIL) capsule 500 mg  Status:  Discontinued     500 mg Oral Every 8 hours 04/06/18 1831 04/07/18 2001    .   Vit D, 1,25-Dihydroxy  18.6       Vitamin D, 25-Hydroxy  12.3    PTH, Intact  80                     Calcium, Total (PTH)  9.2                   PREALBUMIN  17.9   Calcium, Ionized, Serum  4.9    POD/HD#: POD 6   -Intramedullary nailing of L hip fracture (basicervical femoral neck fracture)  WBAT L leg  No range of motion restrictions to L hip, knee or ankle   PT/OT  Dressing changes as needed   Ok to shower and clean wound with soap and water only    Ok to leave wound open to air if no drainage present   Ice as needed for swelling and pain control  - Pain management:  Oxycodone PRN for pain. Tylenol 650 mg q8h.  - ABL anemia/Hemodynamics  2 units of packed red cells on 04/07/2018. Continue to monitor H&H.   - Medical issues   Stage IV kidney failure, ARF, essential hypertension, hypothyroidism, chronic alcohol use  - DVT/PE prophylaxis:  Lovenox scheduled q24h  Knee high ted hose ordered  - ID:   Ancef prior to operation. Currently discontinued.   - Metabolic Bone Disease:  Severe vitamin d deficiency in setting of chronic renal disease    Supplement vitamin D 5000 IUs daily   Calcium citrate 200 mg BID  DEXA as outpt   Will likely need pharmacologic treatment of her osteoporosis   - Activity:  Weightbearing as tolerated. No range of motion restrictions. Not currently ambulating due to catheter placement which should be removed this morning.   - FEN/GI prophylaxis/Foley/Lines:  Regular diet   - Impediments to fracture  healing:  Renal disease, chronic alcohol use, osteoporosis, vitamin D deficiency, malnutrition, sedentary lifestyle   - Dispo:  Discharge to SNF facility  Stable from ortho standpoint for discharge  Follow up with ortho in 2 weeks    Olivia Clelland, PA-S [place]  Pt seen and evaluated with PA-S Clelland I agree with above findings  Recommend outpt follow up with renal. Likely needs to be on calcitriol due to CKD/secondary hyperparathyroidism   Pt also has a FRAX score that shows a 32 % probability of 10 year major osteoporotic fracture and 13 % probability of hip fracture.    Jari Pigg, PA-C [phone] (C) 04/13/2018, 9:38 AM  [facility] [place] [phone] (F)

## 2018-04-13 NOTE — Progress Notes (Signed)
PROGRESS NOTE    HAMPTON COST  LZJ:[id number] DOB: June 16, 1940 DOA: 04/06/2018 PCP: [name], MD    Brief Narrative:   77 y.o.femalewith medical history ofessential hypertension, CKD stage IV, hypothyroidism, rectal prolapse admitted from [facility] on 04/06/2018 with left hip fracture after mechanical fall on 04/05/2018   Assessment & Plan:   Principal Problem:   Femoral neck fracture (HCC) Active Problems:   Hypothyroidism   Essential hypertension   CKD (chronic kidney disease) stage 4, GFR 15-29 ml/min (HCC)   Malnutrition of moderate degree   ARF (acute renal failure) (HCC)   Closed fracture of left hip (HCC)  #1 left distal femoral neck fracture involving greater trochanter Secondary to mechanical fall.  Patient seen by orthopedics and patient underwent IM nail intertrochanteric left per Dr. Marcelino Scot 04/07/2018.  PT/OT.  Needs skilled nursing facility.  Per orthopedics.  2.  Hypothyroidism TSH noted to be elevated at 6.2.  Synthroid dose has been increased to 75 MCG's daily.  Outpatient follow-up will need thyroid function studies done in 4 to 6 weeks.   3.  Hypertension Blood pressure improved on resumption of home regimen of Norvasc.  Outpatient follow-up.  4.  Acute renal failure on chronic kidney disease stage IV  No recent renal function noted on epic.  Last creatinine noted to be in the system was 0.93 in July 2011.  Patient does endorse chronic kidney disease and follows with a nephrologist/ [facility].  Creatinine fluctuating currently at 1.78 from 1.86 from 1.66 from 2.01 from 2.19 from 1.83 from 1.70.  Urine sodium is 27.  Urine creatinine 200.41.  Fractional excretion of sodium is 0.18%.  Renal ultrasound with small echogenic kidneys bilaterally which can be seen in the setting of medical renal disease.  Patient status post 2 units packed red blood cells 04/09/2018.  Saline lock IV fluids.  Outpatient follow-up with nephrologist.  5.  Anemia/iron  deficiency anemia/anemia of chronic disease. Likely secondary to acute postop blood loss versus dilutional.  Patient with no overt bleeding.  Anemia panel consistent with iron deficiency anemia/anemia of chronic disease.  Hemoglobin currently at 8.9 from 9.6 from 10.3 from 9.5 from 7.7 from 10.2 on admission.  Staus post 2 units packed red blood cells 04/09/2018.  Status post IV iron.  Continue oral iron supplementation.  Follow H&H.  Outpatient follow-up.    DVT prophylaxis: Lovenox Code Status: Full Family Communication: Updated patient and son at bedside.  Disposition Plan: Skilled nursing facility when bed available.    Consultants:   Orthopedics: Dr. Marcelino Scot 04/06/2018  Procedures:   Plain films of the left knee 04/06/2018  Chest x-ray 04/06/2018  Plain films of the left hip and pelvis 04/06/2018  Plain films of the left femur 04/07/2018  Renal ultrasound 04/08/2018  IM nail left intertrochanteric 04/07/2018 per Dr. Marcelino Scot  2 units packed red blood cell transfusion 04/09/2018  Antimicrobials:   None   Subjective: Patient sitting up in chair.  Denies chest pain.  Denies shortness of breath.    Objective: Vitals:   04/12/18 1517 04/12/18 2318 04/13/18 0359 04/13/18 1105  BP: 139/80 118/65 (!) 146/76 126/74  Pulse: 96 (!) 103 96 98  Resp: 17  16 20   Temp: 99.1 F (37.3 C) 99.3 F (37.4 C) 98.9 F (37.2 C) 98.6 F (37 C)  TempSrc: Oral Oral Oral Oral  SpO2: 92% 90% 92% 91%  Weight:      Height:        Intake/Output Summary (Last 24  hours) at 04/13/2018 1249 Last data filed at 04/12/2018 1902 Gross per 24 hour  Intake 480 ml  Output 700 ml  Net -220 ml   Filed Weights   04/06/18 1143 04/07/18 1523  Weight: 60.8 kg 60.8 kg    Examination:  General exam: No acute distress. Respiratory system: Lungs clear to auscultation bilaterally.  No wheezes, no crackles, no rhonchi.   Cardiovascular system: Regular rate and rhythm no murmurs rubs or gallops.  No  JVD.  No lower extremity edema.  Gastrointestinal system: Abdomen is soft, nontender, nondistended, positive bowel sounds.  No rebound.  No guarding. Central nervous system: Alert and oriented. No focal neurological deficits. Extremities: Symmetric 5 x 5 power. Skin: No rashes, lesions or ulcers Psychiatry: Judgement and insight appear normal. Mood & affect appropriate.     Data Reviewed: I have personally reviewed following labs and imaging studies  CBC: Recent Labs  Lab 04/07/18 0437 04/08/18 0255 04/09/18 0205 04/09/18 0907 04/10/18 0146 04/11/18 0503 04/12/18 0729 04/13/18 0319  WBC 7.4 7.1 8.3  --  9.8  --   --   --   HGB 10.2* 9.0* 7.7* 7.8* 9.5* 10.3* 9.6* 8.9*  HCT 32.5* 29.5* 25.1* 25.0* 31.2* 32.8* 30.8* 27.7*  MCV 96.4 100.0 99.6  --  94.3  --   --   --   PLT 389 294 267  --  297  --   --   --    Basic Metabolic Panel: Recent Labs  Lab 04/07/18 0437  04/09/18 0205 04/10/18 0146 04/11/18 0503 04/12/18 0729 04/13/18 0319  NA 140   < > 138 136 137 135 134*  K 4.3   < > 4.6 4.1 4.4 4.8 4.5  CL 110   < > 110 108 103 102 102  CO2 21*   < > 16* 20* 24 22 30   GLUCOSE 112*   < > 112* 123* 105* 111* 110*  BUN 26*   < > 26* 30* 22 26* 26*  CREATININE 1.70*   < > 2.19* 2.01* 1.66* 1.86* 1.78*  CALCIUM 9.1  9.2   < > 8.2* 8.3* 8.9 8.8* 8.4*  MG 1.8  --   --   --   --   --   --   PHOS 3.9  --   --   --   --   --   --    < > = values in this interval not displayed.   GFR: Estimated Creatinine Clearance: 22.2 mL/min (A) (by C-G formula based on SCr of 1.78 mg/dL (H)). Liver Function Tests: Recent Labs  Lab 04/07/18 0437 04/09/18 0205 04/09/18 0620  AST 15 QUANTITY NOT SUFFICIENT, UNABLE TO PERFORM TEST 19  ALT 12 QUANTITY NOT SUFFICIENT, UNABLE TO PERFORM TEST 8  ALKPHOS 77 55  --   BILITOT 0.8 QUANTITY NOT SUFFICIENT, UNABLE TO PERFORM TEST 0.5  PROT 6.5 5.5*  --   ALBUMIN 3.4* 2.6*  --    No results for input(s): LIPASE, AMYLASE in the last 168 hours. No  results for input(s): AMMONIA in the last 168 hours. Coagulation Profile: Recent Labs  Lab 04/07/18 0437  INR 1.08   Cardiac Enzymes: No results for input(s): CKTOTAL, CKMB, CKMBINDEX, TROPONINI in the last 168 hours. BNP (last 3 results) No results for input(s): PROBNP in the last 8760 hours. HbA1C: No results for input(s): HGBA1C in the last 72 hours. CBG: No results for input(s): GLUCAP in the last 168 hours. Lipid Profile: No results for  input(s): CHOL, HDL, LDLCALC, TRIG, CHOLHDL, LDLDIRECT in the last 72 hours. Thyroid Function Tests: No results for input(s): TSH, T4TOTAL, FREET4, T3FREE, THYROIDAB in the last 72 hours. Anemia Panel: No results for input(s): VITAMINB12, FOLATE, FERRITIN, TIBC, IRON, RETICCTPCT in the last 72 hours. Sepsis Labs: No results for input(s): PROCALCITON, LATICACIDVEN in the last 168 hours.  Recent Results (from the past 240 hour(s))  MRSA PCR Screening     Status: None   Collection Time: 04/06/18 11:21 PM  Result Value Ref Range Status   MRSA by PCR NEGATIVE NEGATIVE Final    Comment:        The GeneXpert MRSA Assay (FDA approved for NASAL specimens only), is one component of a comprehensive MRSA colonization surveillance program. It is not intended to diagnose MRSA infection nor to guide or monitor treatment for MRSA infections. Performed at [facility], [place] [address] [place], [place] [address]   Urine Culture     Status: None   Collection Time: 04/08/18  8:39 AM  Result Value Ref Range Status   Specimen Description URINE, RANDOM  Final   Special Requests NONE  Final   Culture   Final    NO GROWTH Performed at [facility], [place] [address], [place], [place] [address]    Report Status 04/09/2018 FINAL  Final         Radiology Studies: No results found.      Scheduled Meds: . acetaminophen  650 mg Oral Q8H  . amLODipine  10 mg Oral Daily  . calcium citrate  200 mg of elemental calcium Oral BID  .  enoxaparin (LOVENOX) injection  30 mg Subcutaneous Q24H  . feeding supplement (ENSURE ENLIVE)  237 mL Oral TID BM  . folic acid  1 mg Oral Daily  . iron polysaccharides  150 mg Oral Daily  . levothyroxine  75 mcg Oral Daily  . multivitamin with minerals  1 tablet Oral Daily  . senna-docusate  1 tablet Oral BID  . thiamine  100 mg Oral Daily   Or  . thiamine  100 mg Intravenous Daily   Continuous Infusions: . ferumoxytol       LOS: 7 days    Time spent: 35 minutes    Irine Seal, MD [facility] Pager [phone]  If 7PM-7AM, please contact night-coverage [email or web address] Password [facility] 04/13/2018, 12:49 PM

## 2018-04-13 NOTE — Progress Notes (Signed)
Pt remains up in recliner chair. Offered several times to assist back to bed. Pt declines stating she feel better up in chair. Asked if norco is effective for pain. Pt states pain was 6/10 prior to norco and now reports 5/ pain to lower back. However, pt states she feels "much better" and that "new medicine helped a lot".

## 2018-04-13 NOTE — Anesthesia Postprocedure Evaluation (Signed)
Anesthesia Post Note  Patient: Sandra Hunter  Procedure(s) Performed: INTRAMEDULLARY (IM) NAIL INTERTROCHANTRIC (Left Hip)     Patient location during evaluation: PACU Anesthesia Type: MAC and Spinal Level of consciousness: awake and alert Pain management: pain level controlled Vital Signs Assessment: post-procedure vital signs reviewed and stable Respiratory status: spontaneous breathing, nonlabored ventilation, respiratory function stable and patient connected to nasal cannula oxygen Cardiovascular status: stable and blood pressure returned to baseline Postop Assessment: no apparent nausea or vomiting and spinal receding Anesthetic complications: no    Last Vitals:  Vitals:   04/12/18 2318 04/13/18 0359  BP: 118/65 (!) 146/76  Pulse: (!) 103 96  Resp:  16  Temp: 37.4 C 37.2 C  SpO2: 90% 92%    Last Pain:  Vitals:   04/13/18 0601  TempSrc:   PainSc: 9                  Lawrnce Reyez

## 2018-04-13 NOTE — Progress Notes (Signed)
Physical Therapy Treatment Patient Details Name: Sandra Hunter MRN: [id number] DOB: 1940/07/07 Today's Date: 04/13/2018    History of Present Illness Pt is a 77 y/o female admitted after fall. Found to have L femoral neck fracture and is s/p L IM nail. PMH includes HTN and CKD.     PT Comments    Pt received in bed saturated in urine. Purewick was removed this AM. Pt encouraged to call nursing to use BSC. She required mod assist bed mobility, mod assist sit to stand with RW and mod assist SPT with RW. Encouragement needed to participate. Cues to stay on task as pt is easily distracted by pain. Pt performed LLE exercises in recliner with feet elevated.Current POC remains appropriate.    Follow Up Recommendations  SNF;Supervision/Assistance - 24 hour     Equipment Recommendations  None recommended by PT    Recommendations for Other Services       Precautions / Restrictions Precautions Precautions: Fall Restrictions Weight Bearing Restrictions: Yes LLE Weight Bearing: Weight bearing as tolerated    Mobility  Bed Mobility Overal bed mobility: Needs Assistance Bed Mobility: Supine to Sit     Supine to sit: Mod assist;HOB elevated     General bed mobility comments: cues for sequencing, +rail, increased time and effort  Transfers Overall transfer level: Needs assistance Equipment used: Rolling walker (2 wheeled) Transfers: Sit to/from [facility] Sit to Stand: Mod assist Stand pivot transfers: Mod assist       General transfer comment: cues for hand placement and sequencing, assist to power up, increased time to stabilize initial standing balance. Pt performed SPT with RW bed>recliner>BSC>recliner.   Ambulation/Gait             General Gait Details: unable due to pain   Stairs             Wheelchair Mobility    Modified Rankin (Stroke Patients Only)       Balance Overall balance assessment: Needs  assistance Sitting-balance support: Feet supported;Single extremity supported Sitting balance-Leahy Scale: Fair     Standing balance support: Bilateral upper extremity supported Standing balance-Leahy Scale: Poor Standing balance comment: reliant on RW and external assist                            Cognition Arousal/Alertness: Awake/alert Behavior During Therapy: WFL for tasks assessed/performed Overall Cognitive Status: No family/caregiver present to determine baseline cognitive functioning                                 General Comments: cues for sequencing and problem solving, difficulty staying on task      Exercises General Exercises - Lower Extremity Ankle Circles/Pumps: AROM;Both;10 reps Heel Slides: AAROM;Left;10 reps Hip ABduction/ADduction: AAROM;Left;10 reps    General Comments        Pertinent Vitals/Pain Pain Assessment: Faces Faces Pain Scale: Hurts whole lot Pain Location: L hip  Pain Descriptors / Indicators: Grimacing;Guarding;Moaning Pain Intervention(s): Patient requesting pain meds-RN notified;Limited activity within patient's tolerance;Repositioned;Monitored during session    Home Living                      Prior Function            PT Goals (current goals can now be found in the care plan section) Acute Rehab PT Goals Patient Stated Goal: decrease pain PT  Goal Formulation: With patient Time For Goal Achievement: 04/23/18 Potential to Achieve Goals: Fair Progress towards PT goals: Progressing toward goals    Frequency    Min 3X/week      PT Plan Current plan remains appropriate    Co-evaluation              AM-PAC PT "6 Clicks" Mobility   Outcome Measure  Help needed turning from your back to your side while in a flat bed without using bedrails?: A Little Help needed moving from lying on your back to sitting on the side of a flat bed without using bedrails?: A Little Help needed moving to  and from a bed to a chair (including a wheelchair)?: A Lot Help needed standing up from a chair using your arms (e.g., wheelchair or bedside chair)?: A Lot Help needed to walk in hospital room?: Total Help needed climbing 3-5 steps with a railing? : Total 6 Click Score: 12    End of Session Equipment Utilized During Treatment: Gait belt Activity Tolerance: Patient limited by pain Patient left: in chair;with call bell/phone within reach;with chair alarm set Nurse Communication: Mobility status;Patient requests pain meds PT Visit Diagnosis: Other abnormalities of gait and mobility (R26.89);Muscle weakness (generalized) (M62.81);History of falling (Z91.81);Unsteadiness on feet (R26.81);Pain Pain - Right/Left: Left Pain - part of body: Hip     Time: 8811-0315 PT Time Calculation (min) (ACUTE ONLY): 28 min  Charges:  $Therapeutic Exercise: 8-22 mins $Therapeutic Activity: 8-22 mins                     Lorrin Goodell, PT  Office # [phone] Pager [phone]    Lorriane Shire 04/13/2018, 11:46 AM

## 2018-04-13 NOTE — Discharge Instructions (Signed)
Orthopaedic Trauma Service Discharge Instructions   General Discharge Instructions  WEIGHT BEARING STATUS: Weightbearing as tolerated Left leg   RANGE OF MOTION/ACTIVITY: unrestricted range of motion left hip and knee, daily therapy. Ok to get on stationary exercise bike   Wound Care: daily wound care effective immediately. See below  Discharge Wound Care Instructions  Do NOT apply any ointments, solutions or lotions to pin sites or surgical wounds.  These prevent needed drainage and even though solutions like hydrogen peroxide kill bacteria, they also damage cells lining the pin sites that help fight infection.  Applying lotions or ointments can keep the wounds moist and can cause them to breakdown and open up as well. This can increase the risk for infection. When in doubt call the office.  Surgical incisions should be dressed daily.  If any drainage is noted, use one layer of adaptic, then gauze, Kerlix, and an ace wrap.  Once the incision is completely dry and without drainage, it may be left open to air out.  Showering may begin 36-48 hours later.  Cleaning gently with soap and water.  Traumatic wounds should be dressed daily as well.    One layer of adaptic, gauze, Kerlix, then ace wrap.  The adaptic can be discontinued once the draining has ceased    If you have a wet to dry dressing: wet the gauze with saline the squeeze as much saline out so the gauze is moist (not soaking wet), place moistened gauze over wound, then place a dry gauze over the moist one, followed by Kerlix wrap, then ace wrap.    Diet: as you were eating previously.  Can use over the counter stool softeners and bowel preparations, such as Miralax, to help with bowel movements.  Narcotics can be constipating.  Be sure to drink plenty of fluids  PAIN MEDICATION USE AND EXPECTATIONS  You have likely been given narcotic medications to help control your pain.  After a traumatic event that results in an fracture  (broken bone) with or without surgery, it is ok to use narcotic pain medications to help control one's pain.  We understand that everyone responds to pain differently and each individual patient will be evaluated on a regular basis for the continued need for narcotic medications. Ideally, narcotic medication use should last no more than 6-8 weeks (coinciding with fracture healing).   As a patient it is your responsibility as well to monitor narcotic medication use and report the amount and frequency you use these medications when you come to your office visit.   We would also advise that if you are using narcotic medications, you should take a dose prior to therapy to maximize you participation.  IF YOU ARE ON NARCOTIC MEDICATIONS IT IS NOT PERMISSIBLE TO OPERATE A MOTOR VEHICLE (MOTORCYCLE/CAR/TRUCK/MOPED) OR HEAVY MACHINERY DO NOT MIX NARCOTICS WITH OTHER CNS (CENTRAL NERVOUS SYSTEM) DEPRESSANTS SUCH AS ALCOHOL   STOP SMOKING OR USING NICOTINE PRODUCTS!!!!  As discussed nicotine severely impairs your body's ability to heal surgical and traumatic wounds but also impairs bone healing.  Wounds and bone heal by forming microscopic blood vessels (angiogenesis) and nicotine is a vasoconstrictor (essentially, shrinks blood vessels).  Therefore, if vasoconstriction occurs to these microscopic blood vessels they essentially disappear and are unable to deliver necessary nutrients to the healing tissue.  This is one modifiable factor that you can do to dramatically increase your chances of healing your injury.    (This means no smoking, no nicotine gum, patches, etc)  DO NOT USE NONSTEROIDAL ANTI-INFLAMMATORY DRUGS (NSAID'S)  Using products such as Advil (ibuprofen), Aleve (naproxen), Motrin (ibuprofen) for additional pain control during fracture healing can delay and/or prevent the healing response.  If you would like to take over the counter (OTC) medication, Tylenol (acetaminophen) is ok.  However, some  narcotic medications that are given for pain control contain acetaminophen as well. Therefore, you should not exceed more than 4000 mg of tylenol in a day if you do not have liver disease.  Also note that there are may OTC medicines, such as cold medicines and allergy medicines that my contain tylenol as well.  If you have any questions about medications and/or interactions please ask your doctor/PA or your pharmacist.      ICE AND ELEVATE INJURED/OPERATIVE EXTREMITY  Using ice and elevating the injured extremity above your heart can help with swelling and pain control.  Icing in a pulsatile fashion, such as 20 minutes on and 20 minutes off, can be followed.    Do not place ice directly on skin. Make sure there is a barrier between to skin and the ice pack.    Using frozen items such as frozen peas works well as the conform nicely to the are that needs to be iced.  USE AN ACE WRAP OR TED HOSE FOR SWELLING CONTROL  In addition to icing and elevation, Ace wraps or TED hose are used to help limit and resolve swelling.  It is recommended to use Ace wraps or TED hose until you are informed to stop.    When using Ace Wraps start the wrapping distally (farthest away from the body) and wrap proximally (closer to the body)   Example: If you had surgery on your leg or thing and you do not have a splint on, start the ace wrap at the toes and work your way up to the thigh        If you had surgery on your upper extremity and do not have a splint on, start the ace wrap at your fingers and work your way up to the upper arm  IF YOU ARE IN A SPLINT OR CAST DO NOT [place]   If your splint gets wet for any reason please contact the office immediately. You may shower in your splint or cast as long as you keep it dry.  This can be done by wrapping in a cast cover or garbage back (or similar)  Do Not stick any thing down your splint or cast such as pencils, money, or hangers to try and scratch yourself  with.  If you feel itchy take benadryl as prescribed on the bottle for itching  IF YOU ARE IN A CAM BOOT (BLACK BOOT)  You may remove boot periodically. Perform daily dressing changes as noted below.  Wash the liner of the boot regularly and wear a sock when wearing the boot. It is recommended that you sleep in the boot until told otherwise  CALL THE OFFICE WITH ANY QUESTIONS OR CONCERNS: [phone]

## 2018-04-13 NOTE — Progress Notes (Signed)
Pt c/o pain. While preparing IV morphine witnessed pt dislodge IV by rubbing and scratching the area. Area has dark purple bruising to site. No pain per pt. States she is itching all over "for days". No rash visualized. MD paged. Awaiting instructions.

## 2018-04-13 NOTE — Plan of Care (Signed)
  Problem: Clinical Measurements: Goal: Ability to maintain clinical measurements within normal limits will improve Outcome: Progressing   

## 2018-04-13 NOTE — Clinical Social Work Note (Signed)
Clinical Social Work Assessment  Patient Details  Name: Sandra Hunter MRN: [id number] Date of Birth: June 08, 1940  Date of referral:  04/13/18               Reason for consult:  Discharge Planning                Permission sought to share information with:  Case Manager, Facility Sport and exercise psychologist, Family Supports Permission granted to share information::  Yes, Verbal Permission Granted  Name::     Sales executive::  SNFs  Relationship::  son  Contact Information:  [phone]  Housing/Transportation Living arrangements for the past 2 months:  [place] of Information:  Patient Patient Interpreter Needed:  None Criminal Activity/Legal Involvement Pertinent to Current Situation/Hospitalization:  No - Comment as needed Significant Relationships:  Adult Children Lives with:  Self Do you feel safe going back to the place where you live?  No Need for family participation in patient care:  Yes (Comment)  Care giving concerns:  CSW received referral for possible SNF placement at time of discharge. Spoke with patient regarding possibility of SNF placement . Patient's  family  is currently unable to care for her at their home given patient's current needs and fall risk.  Patient  expressed understanding of PT recommendation and are agreeable to SNF placement at time of discharge. CSW to continue to follow and assist with discharge planning needs.     Social Worker assessment / plan:  Spoke with patient concerning possibility of rehab at SNF before returning home.    Employment status:  Retired Nurse, adult PT Recommendations:  [place] / Referral to community resources:  [place]  Patient/Family's Response to care:  Patient recognizes need for rehab before returning home and are agreeable to a SNF near [place]. They report preference for [facility]      . CSW explained  insurance authorization process. Patient gave CSW permission to contact daughter and son on facesheet if needed. No family currently at bedside.    Patient/Family's Understanding of and Emotional Response to Diagnosis, Current Treatment, and Prognosis:  Patient/family is realistic regarding therapy needs and expressed being hopeful for SNF placement. Patient expressed understanding of CSW role and discharge process as well as medical condition. No questions/concerns about plan or treatment.    Emotional Assessment Appearance:  Appears stated age Attitude/Demeanor/Rapport:  Gracious Affect (typically observed):  Accepting, Adaptable Orientation:  Oriented to Self, Oriented to Place, Oriented to  Time, Oriented to Situation Alcohol / Substance use:  Not Applicable Psych involvement (Current and /or in the community):  No (Comment)  Discharge Needs  Concerns to be addressed:  Discharge Planning Concerns Readmission within the last 30 days:  No Current discharge risk:  Dependent with Mobility Barriers to Discharge:  Continued Medical Work up   [employer], LCSW 04/13/2018, 11:07 AM

## 2018-04-13 NOTE — Progress Notes (Signed)
Notified Dr. Grandville Silos of pt c/o generalized itching with no visible skin changes. New orders obtained. PA notified of pt pain issues and new orders obtained. Did inform pt of changes.

## 2018-04-14 DIAGNOSIS — K5900 Constipation, unspecified: Secondary | ICD-10-CM | POA: Diagnosis not present

## 2018-04-14 LAB — BASIC METABOLIC PANEL
Anion gap: 11 (ref 5–15)
BUN: 28 mg/dL — ABNORMAL HIGH (ref 8–23)
CO2: 23 mmol/L (ref 22–32)
Calcium: 8.8 mg/dL — ABNORMAL LOW (ref 8.9–10.3)
Chloride: 103 mmol/L (ref 98–111)
Creatinine, Ser: 1.96 mg/dL — ABNORMAL HIGH (ref 0.44–1.00)
GFR calc Af Amer: 28 mL/min — ABNORMAL LOW (ref >60)
GFR calc non Af Amer: 24 mL/min — ABNORMAL LOW (ref >60)
Glucose, Bld: 94 mg/dL (ref 70–99)
Potassium: 4.4 mmol/L (ref 3.5–5.1)
Sodium: 137 mmol/L (ref 135–145)

## 2018-04-14 LAB — HEMOGLOBIN AND HEMATOCRIT, BLOOD
HCT: 30.7 % — ABNORMAL LOW (ref 36.0–46.0)
Hemoglobin: 9.8 g/dL — ABNORMAL LOW (ref 12.0–15.0)

## 2018-04-14 MED ORDER — POLYETHYLENE GLYCOL 3350 17 G PO PACK
17.0000 g | PACK | Freq: Two times a day (BID) | ORAL | Status: DC
  Administered 2018-04-14: 17 g via ORAL
  Filled 2018-04-14: qty 1

## 2018-04-14 MED ORDER — SORBITOL 70 % SOLN
30.0000 mL | Freq: Once | Status: AC
  Administered 2018-04-14: 30 mL via ORAL
  Filled 2018-04-14: qty 30

## 2018-04-14 NOTE — Plan of Care (Signed)
  Problem: Health Behavior/Discharge Planning: Goal: Ability to manage health-related needs will improve Outcome: Progressing   Problem: Clinical Measurements: Goal: Ability to maintain clinical measurements within normal limits will improve Outcome: Progressing   Problem: Activity: Goal: Risk for activity intolerance will decrease Outcome: Progressing   Problem: Pain Managment: Goal: General experience of comfort will improve Outcome: Progressing   

## 2018-04-14 NOTE — Progress Notes (Signed)
PROGRESS NOTE    Sandra Hunter  GUY:[id number] DOB: 1940/11/10 DOA: 04/06/2018 PCP: [name], MD    Brief Narrative:   77 y.o.femalewith medical history ofessential hypertension, CKD stage IV, hypothyroidism, rectal prolapse admitted from [facility] on 04/06/2018 with left hip fracture after mechanical fall on 04/05/2018   Assessment & Plan:   Principal Problem:   Femoral neck fracture (HCC) Active Problems:   Hypothyroidism   Essential hypertension   CKD (chronic kidney disease) stage 4, GFR 15-29 ml/min (HCC)   Malnutrition of moderate degree   ARF (acute renal failure) (HCC)   Closed fracture of left hip (HCC)   Constipation  #1 left distal femoral neck fracture involving greater trochanter Secondary to mechanical fall.  Patient seen by orthopedics and patient underwent IM nail intertrochanteric left per Dr. Marcelino Scot 04/07/2018.  PT/OT. Per orthopedics.  Awaiting SNF placement.  2.  Hypothyroidism TSH noted to be elevated at 6.2.  Synthroid dose has been increased to 75 MCG's daily.  Outpatient follow-up will need thyroid function studies done in 4 to 6 weeks.   3.  Hypertension Improved on current regimen of Norvasc.  Outpatient follow-up.   4.  Acute renal failure on chronic kidney disease stage IV  No recent renal function noted on epic.  Last creatinine noted to be in the system was 0.93 in July 2011.  Patient does endorse chronic kidney disease and follows with a nephrologist/ [facility].  Creatinine fluctuating currently at 1.96 from 1.78 from 1.86 from 1.66 from 2.01 from 2.19 from 1.83 from 1.70.  Urine sodium is 27.  Urine creatinine 200.41.  Fractional excretion of sodium is 0.18%.  Renal ultrasound with small echogenic kidneys bilaterally which can be seen in the setting of medical renal disease.  Patient status post 2 units packed red blood cells 04/09/2018.  Saline lock IV fluids.  Outpatient follow-up with nephrologist.  5.  Anemia/iron deficiency  anemia/anemia of chronic disease. Likely secondary to acute postop blood loss versus dilutional.  Patient with no overt bleeding.  Anemia panel consistent with iron deficiency anemia/anemia of chronic disease.  Hemoglobin currently at 9.8 from 8.9 from 9.6 from 10.3 from 9.5 from 7.7 from 10.2 on admission.  Staus post 2 units packed red blood cells 04/09/2018.  Status post IV iron.  Continue oral iron supplementation.  Follow H&H.  Outpatient follow-up.    DVT prophylaxis: Lovenox Code Status: Full Family Communication: Updated patient.  No family at bedside. Disposition Plan: Skilled nursing facility when bed available.    Consultants:   Orthopedics: Dr. Marcelino Scot 04/06/2018  Procedures:   Plain films of the left knee 04/06/2018  Chest x-ray 04/06/2018  Plain films of the left hip and pelvis 04/06/2018  Plain films of the left femur 04/07/2018  Renal ultrasound 04/08/2018  IM nail left intertrochanteric 04/07/2018 per Dr. Marcelino Scot  2 units packed red blood cell transfusion 04/09/2018  Antimicrobials:   None   Subjective: Patient sitting up in chair.  States pain is improving.  Denies any shortness of breath.  No chest pain.  Patient states she is starting to feel depressed cooped up in the hospital.  Somewhat tearful.   Objective: Vitals:   04/13/18 1200 04/13/18 1946 04/14/18 0335 04/14/18 0800  BP: (!) 131/56 136/83 (!) 163/91 (!) 156/89  Pulse: 92 93 100 99  Resp:  16 14 16   Temp:  98.7 F (37.1 C) 98.5 F (36.9 C) 98.4 F (36.9 C)  TempSrc:  Oral Oral Oral  SpO2:  93% 92% 93%  Weight:      Height:        Intake/Output Summary (Last 24 hours) at 04/14/2018 1316 Last data filed at 04/14/2018 0539 Gross per 24 hour  Intake 360 ml  Output 200 ml  Net 160 ml   Filed Weights   04/06/18 1143 04/07/18 1523  Weight: 60.8 kg 60.8 kg    Examination:  General exam: No acute distress. Respiratory system: CTA B.  No wheezes, no crackles, no rhonchi.    Cardiovascular system: RRR no murmurs rubs or gallops.  No JVD.  No lower extremity edema.  Gastrointestinal system: Abdomen is nontender, nondistended, soft, positive bowel sounds.  No rebound.  No guarding.  Central nervous system: Alert and oriented. No focal neurological deficits. Extremities: Symmetric 5 x 5 power. Skin: No rashes, lesions or ulcers Psychiatry: Judgement and insight appear normal. Mood & affect appropriate.     Data Reviewed: I have personally reviewed following labs and imaging studies  CBC: Recent Labs  Lab 04/08/18 0255 04/09/18 0205  04/10/18 0146 04/11/18 0503 04/12/18 0729 04/13/18 0319 04/14/18 0349  WBC 7.1 8.3  --  9.8  --   --   --   --   HGB 9.0* 7.7*   < > 9.5* 10.3* 9.6* 8.9* 9.8*  HCT 29.5* 25.1*   < > 31.2* 32.8* 30.8* 27.7* 30.7*  MCV 100.0 99.6  --  94.3  --   --   --   --   PLT 294 267  --  297  --   --   --   --    < > = values in this interval not displayed.   Basic Metabolic Panel: Recent Labs  Lab 04/10/18 0146 04/11/18 0503 04/12/18 0729 04/13/18 0319 04/14/18 0349  NA 136 137 135 134* 137  K 4.1 4.4 4.8 4.5 4.4  CL 108 103 102 102 103  CO2 20* 24 22 30 23   GLUCOSE 123* 105* 111* 110* 94  BUN 30* 22 26* 26* 28*  CREATININE 2.01* 1.66* 1.86* 1.78* 1.96*  CALCIUM 8.3* 8.9 8.8* 8.4* 8.8*   GFR: Estimated Creatinine Clearance: 20.2 mL/min (A) (by C-G formula based on SCr of 1.96 mg/dL (H)). Liver Function Tests: Recent Labs  Lab 04/09/18 0205 04/09/18 0620  AST QUANTITY NOT SUFFICIENT, UNABLE TO PERFORM TEST 19  ALT QUANTITY NOT SUFFICIENT, UNABLE TO PERFORM TEST 8  ALKPHOS 55  --   BILITOT QUANTITY NOT SUFFICIENT, UNABLE TO PERFORM TEST 0.5  PROT 5.5*  --   ALBUMIN 2.6*  --    No results for input(s): LIPASE, AMYLASE in the last 168 hours. No results for input(s): AMMONIA in the last 168 hours. Coagulation Profile: No results for input(s): INR, PROTIME in the last 168 hours. Cardiac Enzymes: No results for  input(s): CKTOTAL, CKMB, CKMBINDEX, TROPONINI in the last 168 hours. BNP (last 3 results) No results for input(s): PROBNP in the last 8760 hours. HbA1C: No results for input(s): HGBA1C in the last 72 hours. CBG: No results for input(s): GLUCAP in the last 168 hours. Lipid Profile: No results for input(s): CHOL, HDL, LDLCALC, TRIG, CHOLHDL, LDLDIRECT in the last 72 hours. Thyroid Function Tests: No results for input(s): TSH, T4TOTAL, FREET4, T3FREE, THYROIDAB in the last 72 hours. Anemia Panel: No results for input(s): VITAMINB12, FOLATE, FERRITIN, TIBC, IRON, RETICCTPCT in the last 72 hours. Sepsis Labs: No results for input(s): PROCALCITON, LATICACIDVEN in the last 168 hours.  Recent Results (from the past 240 hour(s))  MRSA PCR Screening     Status: None   Collection Time: 04/06/18 11:21 PM  Result Value Ref Range Status   MRSA by PCR NEGATIVE NEGATIVE Final    Comment:        The GeneXpert MRSA Assay (FDA approved for NASAL specimens only), is one component of a comprehensive MRSA colonization surveillance program. It is not intended to diagnose MRSA infection nor to guide or monitor treatment for MRSA infections. Performed at [facility], [place] [address] [place], [place] [address]   Urine Culture     Status: None   Collection Time: 04/08/18  8:39 AM  Result Value Ref Range Status   Specimen Description URINE, RANDOM  Final   Special Requests NONE  Final   Culture   Final    NO GROWTH Performed at [facility], [place] [address] [place], [place] [address]    Report Status 04/09/2018 FINAL  Final         Radiology Studies: No results found.      Scheduled Meds: . acetaminophen  650 mg Oral Q8H  . amLODipine  10 mg Oral Daily  . calcium citrate  200 mg of elemental calcium Oral BID  . enoxaparin (LOVENOX) injection  30 mg Subcutaneous Q24H  . feeding supplement (ENSURE ENLIVE)  237 mL Oral TID BM  . folic acid  1 mg Oral Daily  . iron  polysaccharides  150 mg Oral Daily  . levothyroxine  75 mcg Oral Daily  . multivitamin with minerals  1 tablet Oral Daily  . polyethylene glycol  17 g Oral BID  . senna-docusate  1 tablet Oral BID  . thiamine  100 mg Oral Daily   Or  . thiamine  100 mg Intravenous Daily   Continuous Infusions: . ferumoxytol       LOS: 8 days    Time spent: 35 minutes    Irine Seal, MD [facility] Pager [phone]  If 7PM-7AM, please contact night-coverage [email or web address] Password [email or web address] 04/14/2018, 1:16 PM

## 2018-04-14 NOTE — Progress Notes (Signed)
Went back to reassess patient and the protruding mass had retracted back in the anus.

## 2018-04-14 NOTE — Progress Notes (Signed)
Physical Therapy Treatment Patient Details Name: Sandra Hunter MRN: [id number] DOB: 07-21-1940 Today's Date: 04/14/2018    History of Present Illness Pt is a 77 y/o female admitted after fall. Found to have L femoral neck fracture and is s/p L IM nail. PMH includes HTN and CKD.     PT Comments    Pt able to ambulate a short distance in room with close recliner follow.  She c/o frequent need to urinate and that assistance can't make it in time.  She had a pad on soaked in urine and requested to wear her Depends with gait.  Recommend SNF.    Follow Up Recommendations  SNF;Supervision/Assistance - 24 hour     Equipment Recommendations  None recommended by PT    Recommendations for Other Services       Precautions / Restrictions Precautions Precautions: Fall Restrictions Weight Bearing Restrictions: Yes LLE Weight Bearing: Weight bearing as tolerated    Mobility  Bed Mobility Overal bed mobility: Needs Assistance Bed Mobility: Supine to Sit     Supine to sit: Min assist     General bed mobility comments: MIN A for LE  Transfers Overall transfer level: Needs assistance Equipment used: Rolling walker (2 wheeled) Transfers: Sit to/from Stand Sit to Stand: Min assist;+2 physical assistance Stand pivot transfers: Mod assist       General transfer comment: cues for hand placement and sequencing  Ambulation/Gait Ambulation/Gait assistance: Mod assist;+2 physical assistance;+2 safety/equipment Gait Distance (Feet): 6 Feet Assistive device: Rolling walker (2 wheeled) Gait Pattern/deviations: Step-to pattern;Shuffle Gait velocity: decreased   General Gait Details: Pt with shuffeling of L foot for first few steps. With cues, she was able to clear the floor during swing through.   Stairs             Wheelchair Mobility    Modified Rankin (Stroke Patients Only)       Balance Overall balance assessment: Needs assistance         Standing balance  support: Bilateral upper extremity supported Standing balance-Leahy Scale: Poor Standing balance comment: reliant on RW and external assist                            Cognition Arousal/Alertness: Awake/alert Behavior During Therapy: WFL for tasks assessed/performed Overall Cognitive Status: No family/caregiver present to determine baseline cognitive functioning                                        Exercises General Exercises - Lower Extremity Ankle Circles/Pumps: AROM;Both;10 reps Quad Sets: 5 reps;Strengthening;Supine;Left Gluteal Sets: 5 reps;Supine Heel Slides: AAROM;Left;5 reps    General Comments        Pertinent Vitals/Pain Pain Assessment: Faces Faces Pain Scale: Hurts even more Pain Location: L hip  Pain Descriptors / Indicators: Grimacing;Moaning;Guarding Pain Intervention(s): Limited activity within patient's tolerance;Monitored during session;Ice applied;Repositioned    Home Living                      Prior Function            PT Goals (current goals can now be found in the care plan section) Acute Rehab PT Goals Patient Stated Goal: decrease pain PT Goal Formulation: With patient Time For Goal Achievement: 04/23/18 Potential to Achieve Goals: Fair Progress towards PT goals: Progressing toward goals;Goals met and updated -  see care plan    Frequency    Min 3X/week      PT Plan Current plan remains appropriate    Co-evaluation              AM-PAC PT "6 Clicks" Mobility   Outcome Measure  Help needed turning from your back to your side while in a flat bed without using bedrails?: A Little Help needed moving from lying on your back to sitting on the side of a flat bed without using bedrails?: A Little Help needed moving to and from a bed to a chair (including a wheelchair)?: A Lot Help needed standing up from a chair using your arms (e.g., wheelchair or bedside chair)?: A Little Help needed to walk in  hospital room?: A Lot Help needed climbing 3-5 steps with a railing? : Total 6 Click Score: 14    End of Session Equipment Utilized During Treatment: Gait belt Activity Tolerance: Patient limited by pain Patient left: in chair;with call bell/phone within reach;with chair alarm set   PT Visit Diagnosis: Other abnormalities of gait and mobility (R26.89);Muscle weakness (generalized) (M62.81);History of falling (Z91.81);Unsteadiness on feet (R26.81);Pain Pain - Right/Left: Left Pain - part of body: Hip     Time: 5732-2025 PT Time Calculation (min) (ACUTE ONLY): 24 min  Charges:  $Gait Training: 8-22 mins $Therapeutic Exercise: 8-22 mins                     Adea Geisel L. Tamala Julian, [place] Pager [phone] 04/14/2018    Galen Manila 04/14/2018, 1:12 PM

## 2018-04-14 NOTE — Plan of Care (Signed)

## 2018-04-14 NOTE — Progress Notes (Signed)
Nurse Tech notified nurse that patient had a protrusion of rectal tissue through the anal orifice after patient had bouts of BM after being medicated with laxatives. Patient had not had a BM in a week. On assessment a pink moist golf ball  mass noted protruding through the anus. Patient denies pain and states that this happens sometimes and I had surgery last year for the same. No bleeding noted. MD notified via Text page. Will cont to monitor.

## 2018-04-14 NOTE — Progress Notes (Signed)
Nutrition Follow-up  DOCUMENTATION CODES:   Non-severe (moderate) malnutrition in context of chronic illness  INTERVENTION:    Continue to offer Ensure Enlive po TID, each supplement provides 350 kcal and 20 grams of protein  NUTRITION DIAGNOSIS:   Moderate Malnutrition related to chronic illness(CKD IV) as evidenced by mild fat depletion, mild muscle depletion.  Ongoing  GOAL:   Patient will meet greater than or equal to 90% of their needs  Progressing  MONITOR:   PO intake, Supplement acceptance, Diet advancement  ASSESSMENT:   Pt with PMH CKD IV, HTN, Hypothyroidism. Admitted due to fall and left femur fracture.   Patient working with PT. Plans for d/c to SNF when medically ready and bed available.  PO intake seems to be improving, consuming on average 50-75% of meals. She is also drinking 1-2 Ensure Enlive supplements per day.  Labs reviewed. Medications reviewed and include Calcitrate, folic acid, iron, MVI, thiamine, miralax, senokot-s.   Diet Order:   Diet Order            Diet regular Room service appropriate? Yes; Fluid consistency: Thin  Diet effective now              EDUCATION NEEDS:   No education needs have been identified at this time  Skin:  Skin Assessment: Reviewed RN Assessment  Last BM:  11/25 (on bowel regimen)  Height:   Ht Readings from Last 1 Encounters:  04/07/18 5\' 3"  (1.6 m)    Weight:   Wt Readings from Last 1 Encounters:  04/07/18 60.8 kg    Ideal Body Weight:  52.27 kg  BMI:  Body mass index is 23.74 kg/m.  Estimated Nutritional Needs:   Kcal:  1500-1700  Protein:  75-90 g  Fluid:  >1.5 L    Molli Barrows, RD, LDN, Grandfather Pager [phone] After Hours Pager [phone]

## 2018-04-15 LAB — BASIC METABOLIC PANEL
Anion gap: 11 (ref 5–15)
BUN: 34 mg/dL — ABNORMAL HIGH (ref 8–23)
CHLORIDE: 106 mmol/L (ref 98–111)
CO2: 24 mmol/L (ref 22–32)
Calcium: 8.7 mg/dL — ABNORMAL LOW (ref 8.9–10.3)
Creatinine, Ser: 1.86 mg/dL — ABNORMAL HIGH (ref 0.44–1.00)
GFR calc Af Amer: 30 mL/min — ABNORMAL LOW (ref >60)
GFR calc non Af Amer: 26 mL/min — ABNORMAL LOW (ref >60)
Glucose, Bld: 96 mg/dL (ref 70–99)
POTASSIUM: 4.1 mmol/L (ref 3.5–5.1)
Sodium: 141 mmol/L (ref 135–145)

## 2018-04-15 LAB — HEMOGLOBIN AND HEMATOCRIT, BLOOD
HCT: 29.2 % — ABNORMAL LOW (ref 36.0–46.0)
Hemoglobin: 8.9 g/dL — ABNORMAL LOW (ref 12.0–15.0)

## 2018-04-15 NOTE — Plan of Care (Signed)

## 2018-04-15 NOTE — Progress Notes (Signed)
PROGRESS NOTE    Sandra Hunter  JAS:[id number] DOB: 1941/03/29 DOA: 04/06/2018 PCP: [name], MD    Brief Narrative:   77 y.o.femalewith medical history ofessential hypertension, CKD stage IV, hypothyroidism, rectal prolapse admitted from [facility] on 04/06/2018 with left hip fracture after mechanical fall on 04/05/2018   Assessment & Plan:   Principal Problem:   Femoral neck fracture (HCC) Active Problems:   Hypothyroidism   Essential hypertension   CKD (chronic kidney disease) stage 4, GFR 15-29 ml/min (HCC)   Malnutrition of moderate degree   ARF (acute renal failure) (HCC)   Closed fracture of left hip (HCC)   Constipation  #1 left distal femoral neck fracture involving greater trochanter Secondary to mechanical fall.  Patient seen by orthopedics and patient underwent IM nail intertrochanteric left per Dr. Marcelino Scot 04/07/2018.  PT/OT. Per orthopedics.  Awaiting SNF placement.  2.  Hypothyroidism TSH noted to be elevated at 6.2.  Synthroid dose has been increased to 75 MCG's daily.  Outpatient follow-up will need thyroid function studies done in 4 to 6 weeks.   3.  Hypertension Improved on current regimen of Norvasc.  Outpatient follow-up.   4.  Acute renal failure on chronic kidney disease stage IV  No recent renal function noted on epic.  Last creatinine noted to be in the system was 0.93 in July 2011.  Patient does endorse chronic kidney disease and follows with a nephrologist/ [facility].  Creatinine fluctuating. Urine sodium is 27.  Urine creatinine 200.41.  Fractional excretion of sodium is 0.18%.  Renal ultrasound with small echogenic kidneys bilaterally which can be seen in the setting of medical renal disease.  Patient status post 2 units packed red blood cells 04/09/2018.  Saline lock IV fluids.  Outpatient follow-up with nephrologist.  5.  Anemia/iron deficiency anemia/anemia of chronic disease. Likely secondary to acute postop blood loss versus  dilutional.  Patient with no overt bleeding.  Anemia panel consistent with iron deficiency anemia/anemia of chronic disease.  Rpt labs ain am.  Staus post 2 units packed red blood cells 04/09/2018.  Status post IV iron.  Continue oral iron supplementation.  Follow H&H.  Outpatient follow-up.    DVT prophylaxis: Lovenox Code Status: Full Family Communication: Updated patient.  No family at bedside. Disposition Plan: await autodiuresis and see renal function in am-not ready just yet for d/c   Consultants:   Orthopedics: Dr. Marcelino Scot 04/06/2018  Procedures:   Plain films of the left knee 04/06/2018  Chest x-ray 04/06/2018  Plain films of the left hip and pelvis 04/06/2018  Plain films of the left femur 04/07/2018  Renal ultrasound 04/08/2018  IM nail left intertrochanteric 04/07/2018 per Dr. Marcelino Scot  2 units packed red blood cell transfusion 04/09/2018  Antimicrobials:   None   Subjective:  Awake alert in nad eatign drinking C/o passing " a lot of urine" No dysuria, no fever  Objective: Vitals:   04/14/18 1816 04/14/18 1958 04/15/18 0411 04/15/18 1408  BP: 138/83 136/74 (!) 151/84 130/82  Pulse: 91 82 89 81  Resp: 16 16 20 16   Temp: 98 F (36.7 C) (!) 97.5 F (36.4 C) 98.1 F (36.7 C) 98.3 F (36.8 C)  TempSrc: Oral Oral Oral Oral  SpO2: 95% 95% 98% 95%  Weight:      Height:       No intake or output data in the 24 hours ending 04/15/18 1430 Filed Weights   04/06/18 1143 04/07/18 1523  Weight: 60.8 kg 60.8 kg  Examination:  General exam: No acute distress. Respiratory system: CTA B.  No wheezes, no crackles, no rhonchi.  Cardiovascular system: RRR no murmurs rubs or gallops.  No JVD.  No lower extremity edema.  Gastrointestinal system: Abdomen is nontender, nondistended, soft, positive bowel sounds.  No rebound.  No guarding.  Central nervous system: Alert and oriented. No focal neurological deficits. Extremities: Symmetric 5 x 5 power. Skin: No  rashes, lesions or ulcers Psychiatry: Judgement and insight appear normal. Mood & affect appropriate.     Data Reviewed: I have personally reviewed following labs and imaging studies  CBC: Recent Labs  Lab 04/09/18 0205  04/10/18 0146 04/11/18 0503 04/12/18 0729 04/13/18 0319 04/14/18 0349 04/15/18 0223  WBC 8.3  --  9.8  --   --   --   --   --   HGB 7.7*   < > 9.5* 10.3* 9.6* 8.9* 9.8* 8.9*  HCT 25.1*   < > 31.2* 32.8* 30.8* 27.7* 30.7* 29.2*  MCV 99.6  --  94.3  --   --   --   --   --   PLT 267  --  297  --   --   --   --   --    < > = values in this interval not displayed.   Basic Metabolic Panel: Recent Labs  Lab 04/11/18 0503 04/12/18 0729 04/13/18 0319 04/14/18 0349 04/15/18 0223  NA 137 135 134* 137 141  K 4.4 4.8 4.5 4.4 4.1  CL 103 102 102 103 106  CO2 24 22 30 23 24   GLUCOSE 105* 111* 110* 94 96  BUN 22 26* 26* 28* 34*  CREATININE 1.66* 1.86* 1.78* 1.96* 1.86*  CALCIUM 8.9 8.8* 8.4* 8.8* 8.7*   GFR: Estimated Creatinine Clearance: 21.3 mL/min (A) (by C-G formula based on SCr of 1.86 mg/dL (H)). Liver Function Tests: Recent Labs  Lab 04/09/18 0205 04/09/18 0620  AST QUANTITY NOT SUFFICIENT, UNABLE TO PERFORM TEST 19  ALT QUANTITY NOT SUFFICIENT, UNABLE TO PERFORM TEST 8  ALKPHOS 55  --   BILITOT QUANTITY NOT SUFFICIENT, UNABLE TO PERFORM TEST 0.5  PROT 5.5*  --   ALBUMIN 2.6*  --    No results for input(s): LIPASE, AMYLASE in the last 168 hours. No results for input(s): AMMONIA in the last 168 hours. Coagulation Profile: No results for input(s): INR, PROTIME in the last 168 hours. Cardiac Enzymes: No results for input(s): CKTOTAL, CKMB, CKMBINDEX, TROPONINI in the last 168 hours. BNP (last 3 results) No results for input(s): PROBNP in the last 8760 hours. HbA1C: No results for input(s): HGBA1C in the last 72 hours. CBG: No results for input(s): GLUCAP in the last 168 hours. Lipid Profile: No results for input(s): CHOL, HDL, LDLCALC, TRIG,  CHOLHDL, LDLDIRECT in the last 72 hours. Thyroid Function Tests: No results for input(s): TSH, T4TOTAL, FREET4, T3FREE, THYROIDAB in the last 72 hours. Anemia Panel: No results for input(s): VITAMINB12, FOLATE, FERRITIN, TIBC, IRON, RETICCTPCT in the last 72 hours. Sepsis Labs: No results for input(s): PROCALCITON, LATICACIDVEN in the last 168 hours.  Recent Results (from the past 240 hour(s))  MRSA PCR Screening     Status: None   Collection Time: 04/06/18 11:21 PM  Result Value Ref Range Status   MRSA by PCR NEGATIVE NEGATIVE Final    Comment:        The GeneXpert MRSA Assay (FDA approved for NASAL specimens only), is one component of a comprehensive MRSA colonization surveillance program.  It is not intended to diagnose MRSA infection nor to guide or monitor treatment for MRSA infections. Performed at [facility], [place] [address], [place], [place] [address]   Urine Culture     Status: None   Collection Time: 04/08/18  8:39 AM  Result Value Ref Range Status   Specimen Description URINE, RANDOM  Final   Special Requests NONE  Final   Culture   Final    NO GROWTH Performed at [facility], [place] [address], [place], [place] [address]    Report Status 04/09/2018 FINAL  Final         Radiology Studies: No results found.      Scheduled Meds: . acetaminophen  650 mg Oral Q8H  . amLODipine  10 mg Oral Daily  . calcium citrate  200 mg of elemental calcium Oral BID  . enoxaparin (LOVENOX) injection  30 mg Subcutaneous Q24H  . feeding supplement (ENSURE ENLIVE)  237 mL Oral TID BM  . folic acid  1 mg Oral Daily  . iron polysaccharides  150 mg Oral Daily  . levothyroxine  75 mcg Oral Daily  . multivitamin with minerals  1 tablet Oral Daily  . polyethylene glycol  17 g Oral BID  . senna-docusate  1 tablet Oral BID  . thiamine  100 mg Oral Daily   Or  . thiamine  100 mg Intravenous Daily   Continuous Infusions: . ferumoxytol       LOS: 9 days     Time spent: 35 minutes    Verneita Griffes, MD [facility] 2:30 PM   If 7PM-7AM, please contact night-coverage [email or web address] Password [email or web address] 04/15/2018, 2:30 PM

## 2018-04-15 NOTE — Plan of Care (Signed)
  Problem: Health Behavior/Discharge Planning: Goal: Ability to manage health-related needs will improve Outcome: Progressing   Problem: Activity: Goal: Risk for activity intolerance will decrease Outcome: Progressing   Problem: Pain Managment: Goal: General experience of comfort will improve Outcome: Progressing   Problem: Safety: Goal: Ability to remain free from injury will improve Outcome: Progressing   

## 2018-04-16 LAB — CBC WITH DIFFERENTIAL/PLATELET
Abs Immature Granulocytes: 0.04 10*3/uL (ref 0.00–0.07)
Basophils Absolute: 0 10*3/uL (ref 0.0–0.1)
Basophils Relative: 0 %
Eosinophils Absolute: 0.6 10*3/uL — ABNORMAL HIGH (ref 0.0–0.5)
Eosinophils Relative: 8 %
HCT: 31.5 % — ABNORMAL LOW (ref 36.0–46.0)
Hemoglobin: 9.5 g/dL — ABNORMAL LOW (ref 12.0–15.0)
Immature Granulocytes: 1 %
Lymphocytes Relative: 20 %
Lymphs Abs: 1.5 10*3/uL (ref 0.7–4.0)
MCH: 28.5 pg (ref 26.0–34.0)
MCHC: 30.2 g/dL (ref 30.0–36.0)
MCV: 94.6 fL (ref 80.0–100.0)
MONO ABS: 1.1 10*3/uL — AB (ref 0.1–1.0)
Monocytes Relative: 15 %
NEUTROS ABS: 4.2 10*3/uL (ref 1.7–7.7)
Neutrophils Relative %: 56 %
Platelets: 594 10*3/uL — ABNORMAL HIGH (ref 150–400)
RBC: 3.33 MIL/uL — AB (ref 3.87–5.11)
RDW: 14 % (ref 11.5–15.5)
WBC: 7.5 10*3/uL (ref 4.0–10.5)
nRBC: 0 % (ref 0.0–0.2)

## 2018-04-16 LAB — RENAL FUNCTION PANEL
Albumin: 2.8 g/dL — ABNORMAL LOW (ref 3.5–5.0)
Anion gap: 10 (ref 5–15)
BUN: 31 mg/dL — ABNORMAL HIGH (ref 8–23)
CO2: 26 mmol/L (ref 22–32)
Calcium: 9 mg/dL (ref 8.9–10.3)
Chloride: 106 mmol/L (ref 98–111)
Creatinine, Ser: 1.79 mg/dL — ABNORMAL HIGH (ref 0.44–1.00)
GFR calc Af Amer: 31 mL/min — ABNORMAL LOW (ref >60)
GFR calc non Af Amer: 27 mL/min — ABNORMAL LOW (ref >60)
Glucose, Bld: 95 mg/dL (ref 70–99)
Phosphorus: 3.9 mg/dL (ref 2.5–4.6)
Potassium: 3.9 mmol/L (ref 3.5–5.1)
Sodium: 142 mmol/L (ref 135–145)

## 2018-04-16 MED ORDER — HYDROXYZINE HCL 10 MG PO TABS: 10.0000 mg | ORAL_TABLET | Freq: Three times a day (TID) | ORAL | 0 refills | Status: DC | PRN

## 2018-04-16 MED ORDER — SENNOSIDES-DOCUSATE SODIUM 8.6-50 MG PO TABS: 1.0000 | ORAL_TABLET | Freq: Two times a day (BID) | ORAL | Status: DC

## 2018-04-16 MED ORDER — HYDROCODONE-ACETAMINOPHEN 7.5-325 MG PO TABS: 1.0000 | ORAL_TABLET | ORAL | 0 refills | Status: DC | PRN

## 2018-04-16 MED ORDER — POLYSACCHARIDE IRON COMPLEX 150 MG PO CAPS: 150.0000 mg | ORAL_CAPSULE | Freq: Every day | ORAL | 0 refills | Status: DC

## 2018-04-16 MED ORDER — CALCIUM CITRATE 950 (200 CA) MG PO TABS: 200.0000 mg | ORAL_TABLET | Freq: Two times a day (BID) | ORAL | Status: DC

## 2018-04-16 MED ORDER — LEVOTHYROXINE SODIUM 75 MCG PO TABS: 75.0000 ug | ORAL_TABLET | Freq: Every day | ORAL | Status: DC

## 2018-04-16 MED ORDER — FOLIC ACID 1 MG PO TABS: 1.0000 mg | ORAL_TABLET | Freq: Every day | ORAL | Status: DC

## 2018-04-16 NOTE — Clinical Social Work Placement (Signed)
   CLINICAL SOCIAL WORK PLACEMENT  NOTE  Date:  04/16/2018  Patient Details  Name: Sandra Hunter MRN: [id number] Date of Birth: [birth year]  Clinical Social Work is seeking post-discharge placement for this patient at the [place] level of care (*CSW will initial, date and re-position this form in  chart as items are completed):      Patient/family provided with [place] Work Department's list of facilities offering this level of care within the geographic area requested by the patient (or if unable, by the patient's family).  Yes   Patient/family informed of their freedom to choose among providers that offer the needed level of care, that participate in Medicare, Medicaid or managed care program needed by the patient, have an available bed and are willing to accept the patient.      Patient/family informed of [facility]'s ownership interest in [facility] and [facility], as well as of the fact that they are under no obligation to receive care at these facilities.  PASRR submitted to EDS on       PASRR number received on 04/09/18     Existing PASRR number confirmed on       FL2 transmitted to all facilities in geographic area requested by pt/family on 04/09/18     FL2 transmitted to all facilities within larger geographic area on       Patient informed that his/her managed care company has contracts with or will negotiate with certain facilities, including the following:        Yes   Patient/family informed of bed offers received.  Patient chooses bed at [facility]     Physician recommends and patient chooses bed at      Patient to be transferred to [facility] on 04/16/18.  Patient to be transferred to facility by PTAR     Patient family notified on 04/16/18 of transfer.  Name of family member notified:  Collier Salina     PHYSICIAN       Additional Comment:     _______________________________________________ Alberteen Sam, LCSW 04/16/2018, 4:00 PM

## 2018-04-16 NOTE — Progress Notes (Signed)
Patient will DC to: [facility] Anticipated DC date: 04/16/18 Family notified: Collier Salina Transport by: Corey Harold  Per MD patient ready for DC to [facility], patient, patient's family, and facility notified of DC. Discharge Summary sent to facility. RN given number for report [phone] . DC packet on chart. Ambulance transport requested for patient.  CSW signing off.  [place], [place]

## 2018-04-16 NOTE — Progress Notes (Signed)
Pt is waiting for [facility] to be transported to [facility]. Report was given to Gearldine Shown at [place].

## 2018-04-16 NOTE — Progress Notes (Signed)
Occupational Therapy Treatment Patient Details Name: Sandra Hunter MRN: [id number] DOB: October 30, 1940 Today's Date: 04/16/2018    History of present illness Pt is a 77 y/o female admitted after fall. Found to have L femoral neck fracture and is s/p L IM nail. PMH includes HTN and CKD.    OT comments  Pt progressing well towards OT goals this session. Pt was able to perform transfers and mobility at +1 assist, but Pt requires chair follow due to decreased activity tolerance. Pt today required mod A for LB ADL/dressing, max A for peri care (Pt able to maintain static standing with support of RW). Pt continues to require post-acute therapy at the SNF level. She is motivated, and very pleasant. Current POC remains appropriate.    Follow Up Recommendations  SNF;Supervision/Assistance - 24 hour    Equipment Recommendations  None recommended by OT    Recommendations for Other Services      Precautions / Restrictions Precautions Precautions: Fall Restrictions Weight Bearing Restrictions: Yes LLE Weight Bearing: Weight bearing as tolerated       Mobility Bed Mobility               General bed mobility comments: Pt in recliner at beginning and end of session  Transfers Overall transfer level: Needs assistance Equipment used: Rolling walker (2 wheeled) Transfers: Sit to/from Stand Sit to Stand: Min guard;Min assist Stand pivot transfers: Min guard;Min assist       General transfer comment: min guard assist for multiple sit <>stands from [facility] and recliner this session. Pt fatigues easily    Balance Overall balance assessment: Needs assistance Sitting-balance support: Feet supported;Single extremity supported Sitting balance-Leahy Scale: Fair     Standing balance support: Bilateral upper extremity supported Standing balance-Leahy Scale: Poor Standing balance comment: reliant on RW for static and dynamic balance                           ADL either  performed or assessed with clinical judgement   ADL Overall ADL's : Needs assistance/impaired     Grooming: Set up;Sitting Grooming Details (indicate cue type and reason): on [facility]             Lower Body Dressing: Moderate assistance;Sit to/from stand Lower Body Dressing Details (indicate cue type and reason): mod A to thread BLE into adult diaper, educated on dressing operated leg first Toilet Transfer: Minimal assistance;Min guard;Stand-pivot;BSC;RW Armed forces technical officer Details (indicate cue type and reason): min guard assist, vc for safe hand placement Toileting- Clothing Manipulation and Hygiene: Maximal assistance;Sit to/from stand Toileting - Clothing Manipulation Details (indicate cue type and reason): Pt requires BUE support on walker and so therapist performed peri care - no assist to maintain standing     Functional mobility during ADLs: Min guard;Rolling walker(hands on ) General ADL Comments: great improvement since last session, still unable to access LB for dressing/bathing     Vision       Perception     Praxis      Cognition Arousal/Alertness: Awake/alert Behavior During Therapy: WFL for tasks assessed/performed Overall Cognitive Status: No family/caregiver present to determine baseline cognitive functioning                                          Exercises     Shoulder Instructions       General Comments  Pertinent Vitals/ Pain       Pain Assessment: Faces Faces Pain Scale: Hurts little more Pain Location: L hip  Pain Descriptors / Indicators: Grimacing;Guarding;Sore Pain Intervention(s): Monitored during session;Repositioned  Home Living                                          Prior Functioning/Environment              Frequency  Min 2X/week        Progress Toward Goals  OT Goals(current goals can now be found in the care plan section)  Progress towards OT goals: Progressing toward  goals  Acute Rehab OT Goals Patient Stated Goal: get to rehab and get back to shopping OT Goal Formulation: With patient Time For Goal Achievement: 04/25/18 Potential to Achieve Goals: Good  Plan Discharge plan remains appropriate;Frequency remains appropriate    Co-evaluation    PT/OT/SLP Co-Evaluation/Treatment: Yes Reason for Co-Treatment: For patient/therapist safety;To address functional/ADL transfers;Other (comment)(activity tolerance) PT goals addressed during session: Mobility/safety with mobility;Balance;Proper use of DME OT goals addressed during session: ADL's and self-care;Proper use of Adaptive equipment and DME      AM-PAC OT "6 Clicks" Daily Activity     Outcome Measure   Help from another person eating meals?: None Help from another person taking care of personal grooming?: None Help from another person toileting, which includes using toliet, bedpan, or urinal?: A Little Help from another person bathing (including washing, rinsing, drying)?: A Lot Help from another person to put on and taking off regular upper body clothing?: A Little Help from another person to put on and taking off regular lower body clothing?: A Lot 6 Click Score: 18    End of Session Equipment Utilized During Treatment: Gait belt;Rolling walker  OT Visit Diagnosis: Pain Pain - Right/Left: Left Pain - part of body: Hip;Leg   Activity Tolerance Patient tolerated treatment well   Patient Left in chair;with call bell/phone within reach;with chair alarm set   Nurse Communication Mobility status        Time: 3154-0086 OT Time Calculation (min): 23 min  Charges: OT General Charges $OT Visit: 1 Visit OT Treatments $Self Care/Home Management : 8-22 mins  Hulda Humphrey OTR/L Acute Rehabilitation Services Pager: [phone] Office: [place] 04/16/2018, 3:52 PM

## 2018-04-16 NOTE — Consult Note (Signed)
[facility] CM Primary Care Navigator  04/16/2018  Sandra Hunter [birth year] [id number]   Met with patientat the bedsideto identify possible discharge needs. Patientreports thatshe "tripped at home and fractured left hip" that resulted to this admission/ surgery. (left distal femoral neck fracture involving greater trochanter secondary to mechanical fall underwent IM- intramedullary nail intertrochanteric left; hypothyroidism, HTN, acute renal failure on chronic kidney disease Stage IV, anemia)  Patientendorses Dr. [name] with [name]. Willey Blade, MD office as the primary care provider.    [place] in [place] to obtain medications withoutdifficulty.  Patientreports thatshe has beenmanaging herown medications at [employer] out of the containers, but may use "pillbox" after discharge if more medications will be added.  Patientverbalized thatshe was driving prior to admission/ surgery but daughter [name]- stays at home) will be able to provide transportationto her doctors' appointmentsafter discharge.  [employer] transportation benefits discussed with patient.  Patientlives with her husband (with Alzheimer's) and serves as the primary caregiver for him but recently, husband has a stay-in nurse who provides care for him at home. Her daughter will be her primary caregiver with assistance from husband's nurse once discharged, as stated.   Anticipateddischargeplan isskilled nursing facility per therapy recommendation (SNF- in process).    Patientvoiced understanding to call primary care provider's office whenshegetsback home, for a post discharge follow-up within1- 2weeksor sooner if needs arise. Patient letter (with PCP's contact number) was provided asherreminder.   Discussed with patientregarding [facility] CM services available for health managementand resourcesat home but she denies any need  for services at this point. Patient states being able to manage health issues so far. Patient was seen and followed by Inpatient dietitian related to malnutrition during this admission.  Patient verbalizedunderstandingto seekreferralfrom primary care provider to [facility] care management ifdeemed necessary and appropriatefor services in the nearfuture- when shegets back home.  [facility] care management information was provided for future needsthatshe may have.  Primary care provider's office is listed as providing transition of care (TOC) follow-up.   For additional questions please contact:  [name], BSN, RN-BC [facility] PRIMARY CARE Navigator Cell: [phone]

## 2018-04-16 NOTE — Progress Notes (Signed)
Physical Therapy Progress Note  Clinical Impression: Pt agreeable to working with therapy today, however continues to be limited in safe mobility by increased pain and decreased strength and endurance. Pt currently requires minA for transfers and hands on min guard for ambulation of 35 feet with RW. D/c plans remain appropriate at this time. PT will continue to follow acutely.     04/16/18 1600  PT Visit Information  Last PT Received On 04/16/18  Assistance Needed +1 (chair follow helpful for more endurance opportunity)  Reason for Co-Treatment For patient/therapist safety  PT goals addressed during session Mobility/safety with mobility;Proper use of DME  OT goals addressed during session ADL's and self-care  History of Present Illness Pt is a 77 y/o female admitted after fall. Found to have L femoral neck fracture and is s/p L IM nail. PMH includes HTN and CKD.   Subjective Data  Patient Stated Goal get to rehab and get back to shopping  Precautions  Precautions Fall  Restrictions  Weight Bearing Restrictions Yes  LLE Weight Bearing WBAT  Pain Assessment  Pain Assessment Faces  Faces Pain Scale 4  Pain Location L hip   Pain Descriptors / Indicators Grimacing;Guarding;Sore  Pain Intervention(s) Monitored during session;Limited activity within patient's tolerance  Cognition  Arousal/Alertness Awake/alert  Behavior During Therapy WFL for tasks assessed/performed  Overall Cognitive Status No family/caregiver present to determine baseline cognitive functioning  Bed Mobility  General bed mobility comments Pt in recliner at beginning and end of session  Transfers  Overall transfer level Needs assistance  Equipment used Rolling walker (2 wheeled)  Transfers Sit to/from Stand  Sit to Stand Min guard;Min assist  Stand pivot transfers Min guard;Min assist  General transfer comment min guard assist for multiple sit <>stands from [facility] and recliner this session. Pt fatigues easily   Ambulation/Gait  Ambulation/Gait assistance Min guard  Gait Distance (Feet) 35 Feet (2x10, 1x35)  Assistive device Rolling walker (2 wheeled)  Gait Pattern/deviations Step-to pattern;Shuffle  General Gait Details min guard for safety, vc for upright posture, looking up and out, pt with decrease in L foot clearance as she fatigues  Gait velocity decreased  Gait velocity interpretation <1.8 ft/sec, indicate of risk for recurrent falls  Balance  Overall balance assessment Needs assistance  Sitting-balance support Feet supported;Single extremity supported  Sitting balance-Leahy Scale Fair  Standing balance support Bilateral upper extremity supported  Standing balance-Leahy Scale Poor  Standing balance comment reliant on RW for static and dynamic balance  General Comments  General comments (skin integrity, edema, etc.) Dressings clean and dry.   PT - End of Session  Equipment Utilized During Treatment Gait belt  Activity Tolerance Patient limited by pain  Patient left in chair;with call bell/phone within reach;with chair alarm set  Nurse Communication Mobility status   PT - Assessment/Plan  PT Plan Current plan remains appropriate  PT Visit Diagnosis Other abnormalities of gait and mobility (R26.89);Muscle weakness (generalized) (M62.81);History of falling (Z91.81);Unsteadiness on feet (R26.81);Pain  Pain - Right/Left Left  Pain - part of body Hip  PT Frequency (ACUTE ONLY) Min 3X/week  Follow Up Recommendations SNF;Supervision/Assistance - 24 hour  PT equipment None recommended by PT  AM-PAC PT "6 Clicks" Mobility Outcome Measure (Version 2)  Help needed turning from your back to your side while in a flat bed without using bedrails? 3  Help needed moving from lying on your back to sitting on the side of a flat bed without using bedrails? 3  Help needed moving to  and from a bed to a chair (including a wheelchair)? 3  Help needed standing up from a chair using your arms (e.g.,  wheelchair or bedside chair)? 3  Help needed to walk in hospital room? 3  Help needed climbing 3-5 steps with a railing?  1  6 Click Score 16  Consider Recommendation of Discharge To: Home with [facility]  PT Goal Progression  Progress towards PT goals Progressing toward goals  Acute Rehab PT Goals  PT Goal Formulation With patient  Time For Goal Achievement 04/23/18  Potential to Achieve Goals Fair  PT Time Calculation  PT Start Time (ACUTE ONLY) 1517  PT Stop Time (ACUTE ONLY) 1540  PT Time Calculation (min) (ACUTE ONLY) 23 min  PT General Charges  $$ ACUTE PT VISIT 1 Visit  PT Treatments  $Gait Training 8-22 mins   Cavon Nicolls B. Migdalia Dk PT, DPT Acute Rehabilitation Services Pager [phone] Office [phone]

## 2018-04-16 NOTE — Progress Notes (Signed)
[facility] has arrived to transport patient to [facility].  Patient is alert and oriented x 4.  She received scheduled Tylenol PO earlier this evening for pain.  She denies any pain or discomfort at this time.

## 2018-04-16 NOTE — Discharge Summary (Signed)
Physician Discharge Summary  Sandra Hunter ZOX:[id number] DOB: March 28, 1941 DOA: 04/06/2018  PCP: [name], MD  Admit date: 04/06/2018 Discharge date: 04/16/2018  Time spent: 25 minutes  Recommendations for Outpatient Follow-up:  1. Needs outpatient therapy at skilled facility with rehab services 2. Prescription given for pain medications on discharge 3. Needs CBC and renal panel in 1 week 4. Would recommend outpatient renal follow-up for chronic kidney disease 5. Recommend TSH in about 3 to 6 weeks as thyroxine adjusted this admission  Discharge Diagnoses:  Principal Problem:   Femoral neck fracture ([place]) Active Problems:   Hypothyroidism   Essential hypertension   CKD (chronic kidney disease) stage 4, GFR 15-29 ml/min (HCC)   Malnutrition of moderate degree   ARF (acute renal failure) (HCC)   Closed fracture of left hip (HCC)   Constipation   Discharge Condition: Improved  Diet recommendation: Renal heart healthy  Filed Weights   04/06/18 1143 04/07/18 1523  Weight: 60.8 kg 60.8 kg    History of present illness:  77 y.o.femalewith medical history ofessential hypertension, CKD stage IV, hypothyroidism, [facility] with left hip fracture aftermechanical fall on 04/05/2018   Hospital Course:  #1 left distal femoral neck fracture involving greater trochanter Secondary to mechanical fall.  Patient seen by orthopedics and patient underwent IM nail intertrochanteric left per Dr. Marcelino Scot 04/07/2018.    Will be discharging to skilled facility  2.  Hypothyroidism TSH noted to be elevated at 6.2.  Synthroid dose has been increased to 75 MCG's daily.  Outpatient follow-up will need thyroid function studies done in 4 to 6 weeks.   3.  Hypertension Improved on current regimen of Norvasc.  Outpatient follow-up.   4.  Acute renal failure on chronic kidney disease stage IV  No recent renal function noted on epic.   Last creatinine noted to be in the system was 0.93 in July 2011.  Patient does endorse chronic kidney disease and follows with a nephrologist/ [facility].  Creatinine fluctuating. Urine sodium is 27.  Urine creatinine 200.41.  Fractional excretion of sodium is 0.18%.  Renal ultrasound with small echogenic kidneys bilaterally which can be seen in the setting of medical renal disease.  5.  Anemia/iron deficiency anemia/anemia of chronic disease. Likely secondary to acute postop blood loss versus dilutional.  Patient with no overt bleeding.  Anemia panel consistent with iron deficiency anemia/anemia of chronic disease.     Had 2 units packed red blood cells 04/09/2018.  Status post IV iron.  Continue oral iron supplementation.  Follow H&H.  Outpatient follow-up.   Discharge Exam: Vitals:   04/15/18 1408 04/15/18 2130  BP: 130/82 138/75  Pulse: 81 87  Resp: 16 16  Temp: 98.3 F (36.8 C) 98.3 F (36.8 C)  SpO2: 95% 96%    General: Awake alert pleasant no distress sitting in bed pain 4/10 Cardiovascular: S1-S2 no murmur rub or gallop Respiratory: Clinically clear no added sound Abdomen soft nontender Range of motion intact no lower extremity swelling neurologically intact  Discharge Instructions   Discharge Instructions    Diet - low sodium heart healthy   Complete by:  As directed    Diet - low sodium heart healthy   Complete by:  As directed    Increase activity slowly   Complete by:  As directed    Increase activity slowly   Complete by:  As directed      Allergies as of 04/16/2018   No Known Allergies  Medication List    STOP taking these medications   amoxicillin 500 MG capsule Commonly known as:  AMOXIL   chlorhexidine 0.12 % solution Commonly known as:  PERIDEX   furosemide 20 MG tablet Commonly known as:  LASIX   ibuprofen 400 MG tablet Commonly known as:  ADVIL,MOTRIN     TAKE these medications   amLODipine 10 MG tablet Commonly known as:  NORVASC Take 10 mg  by mouth daily.   calcium citrate 950 MG tablet Commonly known as:  CALCITRATE - dosed in mg elemental calcium Take 1 tablet (200 mg of elemental calcium total) by mouth 2 (two) times daily.   folic acid 1 MG tablet Commonly known as:  FOLVITE Take 1 tablet (1 mg total) by mouth daily. Start taking on:  04/17/2018   HYDROcodone-acetaminophen 7.5-325 MG tablet Commonly known as:  NORCO Take 1 tablet by mouth every 4 (four) hours as needed for moderate pain or severe pain.   hydrOXYzine 10 MG tablet Commonly known as:  ATARAX/VISTARIL Take 1 tablet (10 mg total) by mouth 3 (three) times daily as needed for itching or anxiety.   iron polysaccharides 150 MG capsule Commonly known as:  NIFEREX Take 1 capsule (150 mg total) by mouth daily. Start taking on:  04/17/2018   levothyroxine 75 MCG tablet Commonly known as:  SYNTHROID, LEVOTHROID Take 1 tablet (75 mcg total) by mouth daily. Start taking on:  04/17/2018 What changed:    medication strength  how much to take   senna-docusate 8.6-50 MG tablet Commonly known as:  Senokot-S Take 1 tablet by mouth 2 (two) times daily.      No Known Allergies Follow-up Information    Donato Heinz, MD Follow up in 3 week(s).   Specialty:  Nephrology Why:  f/u in 3 weeks Contact information: [place] [address] [phone]            The results of significant diagnostics from this hospitalization (including imaging, microbiology, ancillary and laboratory) are listed below for reference.    Significant Diagnostic Studies: Dg Chest 1 View  Result Date: 04/06/2018 CLINICAL DATA:  Pre operative respiratory exam.  Left hip fracture. EXAM: CHEST  1 VIEW COMPARISON:  Thoracic radiographs dated 12/29/2009 FINDINGS: The heart size and pulmonary vascularity are normal. Lungs are clear. Tortuosity of the brachiocephalic vessels. Chronic lobulation of the right hemidiaphragm. Thoracolumbar scoliosis, unchanged.  IMPRESSION: No acute abnormality of the chest. Electronically Signed   By: Lorriane Shire M.D.   On: 04/06/2018 13:20   US Renal  Result Date: 04/08/2018 CLINICAL DATA:  Chronic kidney disease.  Acute renal failure. EXAM: RENAL / URINARY TRACT ULTRASOUND COMPLETE COMPARISON:  Abdominal ultrasound 12/06/2016. FINDINGS: Right Kidney: Renal measurements: 7.3 x 4.0 x 5.7 cm = volume: 166 mL. Renal parenchyma is thinned. Parenchyma is diffusely hyperechoic. No focal lesions are present. There is no stone or mass lesion. No hydronephrosis is present. Left Kidney: Renal measurements: 6.5 x 4.0 x 4.1 cm = volume: 107 mL. Renal parenchyma is thinned. Parenchyma is diffusely hyperechoic. No focal lesions are present. There is no stone or mass lesion. No hydronephrosis is present. Bladder: Appears normal for degree of bladder distention. IMPRESSION: Small echogenic kidneys bilaterally. The finding is nonspecific, but can be seen in the setting of medical renal disease. There is no significant interval change. Electronically Signed   By: San Morelle M.D.   On: 04/08/2018 10:25   Dg Knee Left Port  Result Date: 04/06/2018 CLINICAL DATA:  Hip fracture. EXAM: PORTABLE LEFT KNEE - 1-2 VIEW COMPARISON:  None. FINDINGS: Mild medial compartmental articular space narrowing. Mild tricompartmental spurring. The lateral projection is off axis. There may well be a knee effusion. IMPRESSION: 1. Probable knee effusion although the suprapatellar bursa is difficult to assess given the obliquity of the lateral projection. 2. Mild osteoarthritis. Electronically Signed   By: Van Clines M.D.   On: 04/06/2018 17:50   Dg C-arm 1-60 Min  Result Date: 04/07/2018 CLINICAL DATA:  Intramedullary rod EXAM: DG C-ARM 61-120 MIN; LEFT FEMUR 2 VIEWS COMPARISON:  04/06/2018 FINDINGS: Eight low resolution intraoperative spot views of the left femur. Total fluoroscopy time was 1 minutes 32 seconds. Initial fluoroscopic images  demonstrate proximal left femur fracture. Subsequent images demonstrate placement of intramedullary rod and fixating screws with anatomic alignment IMPRESSION: Intraoperative fluoroscopic assistance provided during surgical fixation of proximal left femur fracture Electronically Signed   By: Donavan Foil M.D.   On: 04/07/2018 20:38   Dg C-arm 1-60 Min  Result Date: 04/07/2018 CLINICAL DATA:  Intramedullary rod EXAM: DG C-ARM 61-120 MIN; LEFT FEMUR 2 VIEWS COMPARISON:  04/06/2018 FINDINGS: Eight low resolution intraoperative spot views of the left femur. Total fluoroscopy time was 1 minutes 32 seconds. Initial fluoroscopic images demonstrate proximal left femur fracture. Subsequent images demonstrate placement of intramedullary rod and fixating screws with anatomic alignment IMPRESSION: Intraoperative fluoroscopic assistance provided during surgical fixation of proximal left femur fracture Electronically Signed   By: Donavan Foil M.D.   On: 04/07/2018 20:38   Dg Hip Port Unilat With Pelvis 1v Left  Result Date: 04/06/2018 CLINICAL DATA:  Hip fracture. EXAM: DG HIP (WITH OR WITHOUT PELVIS) 1V PORT LEFT COMPARISON:  04/06/2018 FINDINGS: No significant change in the appearance of distal left femoral neck fracture, which involves the greater trochanter. Impaction of the distal fracture fragment, similar to the prior image. There is overlying soft tissue swelling. IMPRESSION: Impacted fracture of the distal left femoral neck, which involves the greater trochanter. Electronically Signed   By: Fidela Salisbury M.D.   On: 04/06/2018 21:40   Dg Hip Unilat With Pelvis 2-3 Views Left  Result Date: 04/07/2018 CLINICAL DATA:  Femur fracture EXAM: DG HIP (WITH OR WITHOUT PELVIS) 2-3V LEFT COMPARISON:  04/06/2018 FINDINGS: Interval intramedullary rod and screw fixation of the fracture at the base of the left femoral neck. Hardware appears intact. There is anatomic alignment. Gas in the soft tissues consistent  with recent surgery. IMPRESSION: Interval intramedullary rod and screw fixation of proximal left femur fracture with expected surgical changes Electronically Signed   By: Donavan Foil M.D.   On: 04/07/2018 20:40   Dg Hip Unilat W Or Wo Pelvis 2-3 Views Left  Result Date: 04/06/2018 CLINICAL DATA:  Left leg pain after a fall yesterday. Leg deformity. EXAM: DG HIP (WITH OR WITHOUT PELVIS) 2-3V LEFT COMPARISON:  None. FINDINGS: There is an angulated low neck fracture of the proximal left femur which appears to involve the greater trochanter. Pelvic bones are intact. IMPRESSION: Proximal left femur fracture as described. Electronically Signed   By: Lorriane Shire M.D.   On: 04/06/2018 13:18   Dg Femur Min 2 Views Left  Result Date: 04/07/2018 CLINICAL DATA:  Intramedullary rod EXAM: DG C-ARM 61-120 MIN; LEFT FEMUR 2 VIEWS COMPARISON:  04/06/2018 FINDINGS: Eight low resolution intraoperative spot views of the left femur. Total fluoroscopy time was 1 minutes 32 seconds. Initial fluoroscopic images demonstrate proximal left femur fracture. Subsequent images demonstrate placement of intramedullary rod  and fixating screws with anatomic alignment IMPRESSION: Intraoperative fluoroscopic assistance provided during surgical fixation of proximal left femur fracture Electronically Signed   By: Donavan Foil M.D.   On: 04/07/2018 20:38    Microbiology: Recent Results (from the past 240 hour(s))  MRSA PCR Screening     Status: None   Collection Time: 04/06/18 11:21 PM  Result Value Ref Range Status   MRSA by PCR NEGATIVE NEGATIVE Final    Comment:        The GeneXpert MRSA Assay (FDA approved for NASAL specimens only), is one component of a comprehensive MRSA colonization surveillance program. It is not intended to diagnose MRSA infection nor to guide or monitor treatment for MRSA infections. Performed at [facility], [place] [address], [place], [place] [address]   Urine Culture     Status: None    Collection Time: 04/08/18  8:39 AM  Result Value Ref Range Status   Specimen Description URINE, RANDOM  Final   Special Requests NONE  Final   Culture   Final    NO GROWTH Performed at [facility], [place] [address], [place],  [address]    Report Status 04/09/2018 FINAL  Final     Labs: Basic Metabolic Panel: Recent Labs  Lab 04/12/18 0729 04/13/18 0319 04/14/18 0349 04/15/18 0223 04/16/18 0204  NA 135 134* 137 141 142  K 4.8 4.5 4.4 4.1 3.9  CL 102 102 103 106 106  CO2 22 30 23 24 26   GLUCOSE 111* 110* 94 96 95  BUN 26* 26* 28* 34* 31*  CREATININE 1.86* 1.78* 1.96* 1.86* 1.79*  CALCIUM 8.8* 8.4* 8.8* 8.7* 9.0  PHOS  --   --   --   --  3.9   Liver Function Tests: Recent Labs  Lab 04/16/18 0204  ALBUMIN 2.8*   No results for input(s): LIPASE, AMYLASE in the last 168 hours. No results for input(s): AMMONIA in the last 168 hours. CBC: Recent Labs  Lab 04/10/18 0146  04/12/18 0729 04/13/18 0319 04/14/18 0349 04/15/18 0223 04/16/18 0204  WBC 9.8  --   --   --   --   --  7.5  NEUTROABS  --   --   --   --   --   --  4.2  HGB 9.5*   < > 9.6* 8.9* 9.8* 8.9* 9.5*  HCT 31.2*   < > 30.8* 27.7* 30.7* 29.2* 31.5*  MCV 94.3  --   --   --   --   --  94.6  PLT 297  --   --   --   --   --  594*   < > = values in this interval not displayed.   Cardiac Enzymes: No results for input(s): CKTOTAL, CKMB, CKMBINDEX, TROPONINI in the last 168 hours. BNP: BNP (last 3 results) No results for input(s): BNP in the last 8760 hours.  ProBNP (last 3 results) No results for input(s): PROBNP in the last 8760 hours.  CBG: No results for input(s): GLUCAP in the last 168 hours.     Signed:  Nita Sells MD   [facility] 04/16/2018, 1:05 PM

## 2018-04-17 DIAGNOSIS — I10 Essential (primary) hypertension: Secondary | ICD-10-CM | POA: Diagnosis not present

## 2018-04-17 DIAGNOSIS — K5900 Constipation, unspecified: Secondary | ICD-10-CM | POA: Diagnosis not present

## 2018-04-17 DIAGNOSIS — Z4789 Encounter for other orthopedic aftercare: Secondary | ICD-10-CM | POA: Diagnosis not present

## 2018-04-17 DIAGNOSIS — I129 Hypertensive chronic kidney disease with stage 1 through stage 4 chronic kidney disease, or unspecified chronic kidney disease: Secondary | ICD-10-CM | POA: Diagnosis not present

## 2018-04-17 DIAGNOSIS — S72002D Fracture of unspecified part of neck of left femur, subsequent encounter for closed fracture with routine healing: Secondary | ICD-10-CM | POA: Diagnosis not present

## 2018-04-17 DIAGNOSIS — M6281 Muscle weakness (generalized): Secondary | ICD-10-CM | POA: Diagnosis not present

## 2018-04-17 DIAGNOSIS — N184 Chronic kidney disease, stage 4 (severe): Secondary | ICD-10-CM | POA: Diagnosis not present

## 2018-04-17 DIAGNOSIS — E039 Hypothyroidism, unspecified: Secondary | ICD-10-CM | POA: Diagnosis not present

## 2018-04-17 DIAGNOSIS — E440 Moderate protein-calorie malnutrition: Secondary | ICD-10-CM | POA: Diagnosis not present

## 2018-04-17 DIAGNOSIS — S72142D Displaced intertrochanteric fracture of left femur, subsequent encounter for closed fracture with routine healing: Secondary | ICD-10-CM | POA: Diagnosis not present

## 2018-04-17 DIAGNOSIS — D509 Iron deficiency anemia, unspecified: Secondary | ICD-10-CM | POA: Diagnosis not present

## 2018-04-20 DIAGNOSIS — S72002D Fracture of unspecified part of neck of left femur, subsequent encounter for closed fracture with routine healing: Secondary | ICD-10-CM | POA: Diagnosis not present

## 2018-04-20 DIAGNOSIS — D509 Iron deficiency anemia, unspecified: Secondary | ICD-10-CM | POA: Diagnosis not present

## 2018-04-20 DIAGNOSIS — I129 Hypertensive chronic kidney disease with stage 1 through stage 4 chronic kidney disease, or unspecified chronic kidney disease: Secondary | ICD-10-CM | POA: Diagnosis not present

## 2018-04-20 DIAGNOSIS — N184 Chronic kidney disease, stage 4 (severe): Secondary | ICD-10-CM | POA: Diagnosis not present

## 2018-04-20 NOTE — Op Note (Signed)
04/07/2018  12:43 PM  PATIENT:  Sandra Hunter  77 y.o. female  PRE-OPERATIVE DIAGNOSIS:  Left basocervical hip fracture  POST-OPERATIVE DIAGNOSIS:  Left basocervical hip fracture  PROCEDURE:  Procedure(s): INTRAMEDULLARY (IM) NAIL INTERTROCHANTRIC (Left) with Biomet short nail  SURGEON:  Surgeon(s) and Role:    Altamese Steele, MD - Primary  PHYSICIAN ASSISTANT: Ainsley Spinner, PA-C  ASSISTANTS: 2. PA Student   ANESTHESIA:   general  EBL:  75 mL   BLOOD ADMINISTERED:none  DRAINS: none   LOCAL MEDICATIONS USED:  NONE  SPECIMEN:  No Specimen  DISPOSITION OF SPECIMEN:  N/A  COUNTS:  YES  TOURNIQUET:  * No tourniquets in log *  DICTATION: .Note written in EPIC  PLAN OF CARE: Admit to inpatient   PATIENT DISPOSITION:  PACU - hemodynamically stable.   Delay start of Pharmacological VTE agent (>24hrs) due to surgical blood loss or risk of bleeding: no  BRIEF SUMMARY AND INDICATION OF PROCEDURE:  Sandra Hunter is a 77 y.o. year- old with multiple medical problems.  I discussed with the patient and family risks and benefits of surgical treatment including the potential for malunion, nonunion, symptomatic hardware, heart attack, stroke, neurovascular injury, bleeding, and others.  After full discussion, the patient and family wished to proceed.  BRIEF SUMMARY OF PROCEDURE:  The patient was taken to the operating room where general anesthesia was induced.  She was positioned supine on the Hana fracture table.  A closed reduction maneuver was performed of the fractured proximal femur and this was confirmed on both AP and lateral xray views. A thorough scrub and wash with chlorhexidine and then Betadine scrub and paint was performed.  After sterile drapes and time-out, a long instrument was used to identify the appropriate starting position under C-arm on both AP and lateral images.  A 3 cm incision was made proximal to the greater trochanter.  The curved  cannulated awl was inserted just medial to the tip of the lateral trochanter and then the starting guidewire advanced into the proximal femur.  This was checked on AP and lateral views.  The starting reamer was engaged with the soft tissue protected by a sleeve.  The curved ball-tipped guidewire was then inserted, making sure it was just posterior as possible in the distal femur and across the fracture site, which stayed in a reduced position.  The medium diameter left short Biomet Affixus nail was then inserted to the appropriate depth.  The guidewire for the lag screw was then inserted with the appropriate anteversion to make sure it was in a center-center position. The antirotation pin was then enplaced to prevent rotation. The lag screw was placed with excellent purchase and position checked on both views.   Traction was released and compression achieved with the screw.  This was followed by placement of one distal locking screw using the jig.  This was confirmed on AP and lateral images. Wounds were irrigated thoroughly, closed in a standard layered fashion. Sterile gently compressive dressings were applied.  Ainsley Spinner, PA-C, assisted throughout.  The patient was awakened from anesthesia and transported to the PACU in stable condition.  PROGNOSIS:  The patient will be weightbearing as tolerated with physical therapy beginning DVT prophylaxis as soon as deemed stable by the Primary Care Service.  She has no range of motion precautions.  We will continue to follow through at the hospital.  Anticipate follow up in the office in 2 weeks for removal of sutures and further evaluation.  [name]. Marcelino Scot, M.D.

## 2018-04-22 DIAGNOSIS — N184 Chronic kidney disease, stage 4 (severe): Secondary | ICD-10-CM | POA: Diagnosis not present

## 2018-04-22 DIAGNOSIS — D509 Iron deficiency anemia, unspecified: Secondary | ICD-10-CM | POA: Diagnosis not present

## 2018-04-22 DIAGNOSIS — I129 Hypertensive chronic kidney disease with stage 1 through stage 4 chronic kidney disease, or unspecified chronic kidney disease: Secondary | ICD-10-CM | POA: Diagnosis not present

## 2018-04-22 DIAGNOSIS — S72002D Fracture of unspecified part of neck of left femur, subsequent encounter for closed fracture with routine healing: Secondary | ICD-10-CM | POA: Diagnosis not present

## 2018-04-23 DIAGNOSIS — I129 Hypertensive chronic kidney disease with stage 1 through stage 4 chronic kidney disease, or unspecified chronic kidney disease: Secondary | ICD-10-CM | POA: Diagnosis not present

## 2018-04-23 DIAGNOSIS — S72002D Fracture of unspecified part of neck of left femur, subsequent encounter for closed fracture with routine healing: Secondary | ICD-10-CM | POA: Diagnosis not present

## 2018-04-23 DIAGNOSIS — D509 Iron deficiency anemia, unspecified: Secondary | ICD-10-CM | POA: Diagnosis not present

## 2018-04-23 DIAGNOSIS — N184 Chronic kidney disease, stage 4 (severe): Secondary | ICD-10-CM | POA: Diagnosis not present

## 2018-04-27 DIAGNOSIS — I129 Hypertensive chronic kidney disease with stage 1 through stage 4 chronic kidney disease, or unspecified chronic kidney disease: Secondary | ICD-10-CM | POA: Diagnosis not present

## 2018-04-27 DIAGNOSIS — S72002D Fracture of unspecified part of neck of left femur, subsequent encounter for closed fracture with routine healing: Secondary | ICD-10-CM | POA: Diagnosis not present

## 2018-04-27 DIAGNOSIS — N184 Chronic kidney disease, stage 4 (severe): Secondary | ICD-10-CM | POA: Diagnosis not present

## 2018-04-30 DIAGNOSIS — I129 Hypertensive chronic kidney disease with stage 1 through stage 4 chronic kidney disease, or unspecified chronic kidney disease: Secondary | ICD-10-CM | POA: Diagnosis not present

## 2018-04-30 DIAGNOSIS — E039 Hypothyroidism, unspecified: Secondary | ICD-10-CM | POA: Diagnosis not present

## 2018-04-30 DIAGNOSIS — N184 Chronic kidney disease, stage 4 (severe): Secondary | ICD-10-CM | POA: Diagnosis not present

## 2018-04-30 DIAGNOSIS — S72002D Fracture of unspecified part of neck of left femur, subsequent encounter for closed fracture with routine healing: Secondary | ICD-10-CM | POA: Diagnosis not present

## 2018-05-04 DIAGNOSIS — S72042D Displaced fracture of base of neck of left femur, subsequent encounter for closed fracture with routine healing: Secondary | ICD-10-CM | POA: Diagnosis not present

## 2018-05-05 ENCOUNTER — Other Ambulatory Visit: Payer: Self-pay | Admitting: *Deleted

## 2018-05-05 NOTE — Patient Outreach (Signed)
[facility]) Care Management  05/05/2018  Sandra Hunter April 04, 1941 [id number]   Transition of Care Referral   Referral Date: 05/05/18 Referral Source:  Date of Admission: 04/16/18  Diagnosis: left hip fx  Date of Discharge: on 05/02/18  Facility: [facility] [place] attempt #1 successful to home number  Patient is able to verify HIPAA Reviewed and addressed Transitional of care referral with patient Sandra Hunter is scheduled to be seen by home health and pending call  She informs CM she "can do all that myself" when CM reviewed [facility] RN CM role and transition of care assessment  Questions She reports she can call the home health agency herself to follow up and disconnected the call    Transition of care services also noted to be completed by primary care MD office staff- Dr Willey Blade office [facility])   Social: Sandra Hunter lives with her spouse and denies issues with performing her care nor transportation issues   Conditions:mitral regurgitation HTN, pulmonary HTN, hypothyroidism, femora neck fx , left hip fx, ckd, arf, constipation, malnutrition, cardiac murmurs, cellulitis of arm and forearm  Medications: denies concerns with taking medications as prescribed, affording medications, side effects of medications and questions about medications   Appointments: States she has an appointment with Dr Willey Blade  Advance directives:denies concerns with taking medications as prescribed, affording medications, side effects of medications and questions about medications   Consent: [facility] RN CM reviewed [facility] services with patient. Patient gave verbal consent for services. Advised patient that other post discharge calls may occur to assess how the patient is doing following the recent hospitalization. Patient voiced understanding and was appreciative of f/u call.  Plan: [facility] RN CM will close case at this  time as patient has been assessed and no needs identified.   [facility] RN CM sent a successful outreach letter as discussed with [facility] brochure enclosed for review  Pt encouraged to return a call to [facility] RN CM prn  Rionna Feltes L. Lavina Hamman, RN, BSN, [place] Management Care Coordinator Direct Number [phone] Mobile number [phone]  Main [facility] number [phone] Fax number [phone]

## 2018-05-09 DIAGNOSIS — N184 Chronic kidney disease, stage 4 (severe): Secondary | ICD-10-CM | POA: Diagnosis not present

## 2018-05-09 DIAGNOSIS — I129 Hypertensive chronic kidney disease with stage 1 through stage 4 chronic kidney disease, or unspecified chronic kidney disease: Secondary | ICD-10-CM | POA: Diagnosis not present

## 2018-05-09 DIAGNOSIS — E440 Moderate protein-calorie malnutrition: Secondary | ICD-10-CM | POA: Diagnosis not present

## 2018-05-09 DIAGNOSIS — M80852D Other osteoporosis with current pathological fracture, left femur, subsequent encounter for fracture with routine healing: Secondary | ICD-10-CM | POA: Diagnosis not present

## 2018-05-09 DIAGNOSIS — E039 Hypothyroidism, unspecified: Secondary | ICD-10-CM | POA: Diagnosis not present

## 2018-05-09 DIAGNOSIS — D631 Anemia in chronic kidney disease: Secondary | ICD-10-CM | POA: Diagnosis not present

## 2018-05-09 DIAGNOSIS — Z9181 History of falling: Secondary | ICD-10-CM | POA: Diagnosis not present

## 2018-05-09 DIAGNOSIS — K5900 Constipation, unspecified: Secondary | ICD-10-CM | POA: Diagnosis not present

## 2018-05-15 DIAGNOSIS — D631 Anemia in chronic kidney disease: Secondary | ICD-10-CM | POA: Diagnosis not present

## 2018-05-15 DIAGNOSIS — N184 Chronic kidney disease, stage 4 (severe): Secondary | ICD-10-CM | POA: Diagnosis not present

## 2018-05-15 DIAGNOSIS — M80852D Other osteoporosis with current pathological fracture, left femur, subsequent encounter for fracture with routine healing: Secondary | ICD-10-CM | POA: Diagnosis not present

## 2018-05-15 DIAGNOSIS — E039 Hypothyroidism, unspecified: Secondary | ICD-10-CM | POA: Diagnosis not present

## 2018-05-15 DIAGNOSIS — K5900 Constipation, unspecified: Secondary | ICD-10-CM | POA: Diagnosis not present

## 2018-05-15 DIAGNOSIS — Z9181 History of falling: Secondary | ICD-10-CM | POA: Diagnosis not present

## 2018-05-15 DIAGNOSIS — I129 Hypertensive chronic kidney disease with stage 1 through stage 4 chronic kidney disease, or unspecified chronic kidney disease: Secondary | ICD-10-CM | POA: Diagnosis not present

## 2018-05-15 DIAGNOSIS — E440 Moderate protein-calorie malnutrition: Secondary | ICD-10-CM | POA: Diagnosis not present

## 2018-05-26 DIAGNOSIS — E440 Moderate protein-calorie malnutrition: Secondary | ICD-10-CM | POA: Diagnosis not present

## 2018-05-26 DIAGNOSIS — K5900 Constipation, unspecified: Secondary | ICD-10-CM | POA: Diagnosis not present

## 2018-05-26 DIAGNOSIS — N184 Chronic kidney disease, stage 4 (severe): Secondary | ICD-10-CM | POA: Diagnosis not present

## 2018-05-26 DIAGNOSIS — Z9181 History of falling: Secondary | ICD-10-CM | POA: Diagnosis not present

## 2018-05-26 DIAGNOSIS — D631 Anemia in chronic kidney disease: Secondary | ICD-10-CM | POA: Diagnosis not present

## 2018-05-26 DIAGNOSIS — M80852D Other osteoporosis with current pathological fracture, left femur, subsequent encounter for fracture with routine healing: Secondary | ICD-10-CM | POA: Diagnosis not present

## 2018-05-26 DIAGNOSIS — E039 Hypothyroidism, unspecified: Secondary | ICD-10-CM | POA: Diagnosis not present

## 2018-05-26 DIAGNOSIS — I129 Hypertensive chronic kidney disease with stage 1 through stage 4 chronic kidney disease, or unspecified chronic kidney disease: Secondary | ICD-10-CM | POA: Diagnosis not present

## 2018-05-29 DIAGNOSIS — D631 Anemia in chronic kidney disease: Secondary | ICD-10-CM | POA: Diagnosis not present

## 2018-05-29 DIAGNOSIS — Z9181 History of falling: Secondary | ICD-10-CM | POA: Diagnosis not present

## 2018-05-29 DIAGNOSIS — I129 Hypertensive chronic kidney disease with stage 1 through stage 4 chronic kidney disease, or unspecified chronic kidney disease: Secondary | ICD-10-CM | POA: Diagnosis not present

## 2018-05-29 DIAGNOSIS — N184 Chronic kidney disease, stage 4 (severe): Secondary | ICD-10-CM | POA: Diagnosis not present

## 2018-05-29 DIAGNOSIS — E440 Moderate protein-calorie malnutrition: Secondary | ICD-10-CM | POA: Diagnosis not present

## 2018-05-29 DIAGNOSIS — M80852D Other osteoporosis with current pathological fracture, left femur, subsequent encounter for fracture with routine healing: Secondary | ICD-10-CM | POA: Diagnosis not present

## 2018-05-29 DIAGNOSIS — K5900 Constipation, unspecified: Secondary | ICD-10-CM | POA: Diagnosis not present

## 2018-05-29 DIAGNOSIS — E039 Hypothyroidism, unspecified: Secondary | ICD-10-CM | POA: Diagnosis not present

## 2018-06-03 DIAGNOSIS — I129 Hypertensive chronic kidney disease with stage 1 through stage 4 chronic kidney disease, or unspecified chronic kidney disease: Secondary | ICD-10-CM | POA: Diagnosis not present

## 2018-06-03 DIAGNOSIS — D631 Anemia in chronic kidney disease: Secondary | ICD-10-CM | POA: Diagnosis not present

## 2018-06-03 DIAGNOSIS — E440 Moderate protein-calorie malnutrition: Secondary | ICD-10-CM | POA: Diagnosis not present

## 2018-06-03 DIAGNOSIS — M80852D Other osteoporosis with current pathological fracture, left femur, subsequent encounter for fracture with routine healing: Secondary | ICD-10-CM | POA: Diagnosis not present

## 2018-06-03 DIAGNOSIS — Z9181 History of falling: Secondary | ICD-10-CM | POA: Diagnosis not present

## 2018-06-03 DIAGNOSIS — K5900 Constipation, unspecified: Secondary | ICD-10-CM | POA: Diagnosis not present

## 2018-06-03 DIAGNOSIS — E039 Hypothyroidism, unspecified: Secondary | ICD-10-CM | POA: Diagnosis not present

## 2018-06-03 DIAGNOSIS — N184 Chronic kidney disease, stage 4 (severe): Secondary | ICD-10-CM | POA: Diagnosis not present

## 2018-06-08 DIAGNOSIS — I129 Hypertensive chronic kidney disease with stage 1 through stage 4 chronic kidney disease, or unspecified chronic kidney disease: Secondary | ICD-10-CM | POA: Diagnosis not present

## 2018-06-08 DIAGNOSIS — N2581 Secondary hyperparathyroidism of renal origin: Secondary | ICD-10-CM | POA: Diagnosis not present

## 2018-06-08 DIAGNOSIS — N189 Chronic kidney disease, unspecified: Secondary | ICD-10-CM | POA: Diagnosis not present

## 2018-06-08 DIAGNOSIS — D631 Anemia in chronic kidney disease: Secondary | ICD-10-CM | POA: Diagnosis not present

## 2018-06-08 DIAGNOSIS — N184 Chronic kidney disease, stage 4 (severe): Secondary | ICD-10-CM | POA: Diagnosis not present

## 2018-06-11 DIAGNOSIS — K5900 Constipation, unspecified: Secondary | ICD-10-CM | POA: Diagnosis not present

## 2018-06-11 DIAGNOSIS — Z9181 History of falling: Secondary | ICD-10-CM | POA: Diagnosis not present

## 2018-06-11 DIAGNOSIS — N184 Chronic kidney disease, stage 4 (severe): Secondary | ICD-10-CM | POA: Diagnosis not present

## 2018-06-11 DIAGNOSIS — I129 Hypertensive chronic kidney disease with stage 1 through stage 4 chronic kidney disease, or unspecified chronic kidney disease: Secondary | ICD-10-CM | POA: Diagnosis not present

## 2018-06-11 DIAGNOSIS — E440 Moderate protein-calorie malnutrition: Secondary | ICD-10-CM | POA: Diagnosis not present

## 2018-06-11 DIAGNOSIS — D631 Anemia in chronic kidney disease: Secondary | ICD-10-CM | POA: Diagnosis not present

## 2018-06-11 DIAGNOSIS — M80852D Other osteoporosis with current pathological fracture, left femur, subsequent encounter for fracture with routine healing: Secondary | ICD-10-CM | POA: Diagnosis not present

## 2018-06-11 DIAGNOSIS — E039 Hypothyroidism, unspecified: Secondary | ICD-10-CM | POA: Diagnosis not present

## 2018-07-01 DIAGNOSIS — S72042D Displaced fracture of base of neck of left femur, subsequent encounter for closed fracture with routine healing: Secondary | ICD-10-CM | POA: Diagnosis not present

## 2018-07-20 DIAGNOSIS — S72142G Displaced intertrochanteric fracture of left femur, subsequent encounter for closed fracture with delayed healing: Secondary | ICD-10-CM | POA: Diagnosis not present

## 2018-07-20 DIAGNOSIS — M25552 Pain in left hip: Secondary | ICD-10-CM | POA: Diagnosis not present

## 2018-09-08 DIAGNOSIS — Z780 Asymptomatic menopausal state: Secondary | ICD-10-CM | POA: Diagnosis not present

## 2018-09-23 DIAGNOSIS — S72142K Displaced intertrochanteric fracture of left femur, subsequent encounter for closed fracture with nonunion: Secondary | ICD-10-CM | POA: Diagnosis not present

## 2018-09-23 DIAGNOSIS — M79659 Pain in unspecified thigh: Secondary | ICD-10-CM | POA: Diagnosis not present

## 2018-09-28 DIAGNOSIS — N189 Chronic kidney disease, unspecified: Secondary | ICD-10-CM | POA: Diagnosis not present

## 2018-09-28 DIAGNOSIS — I129 Hypertensive chronic kidney disease with stage 1 through stage 4 chronic kidney disease, or unspecified chronic kidney disease: Secondary | ICD-10-CM | POA: Diagnosis not present

## 2018-09-28 DIAGNOSIS — E875 Hyperkalemia: Secondary | ICD-10-CM | POA: Diagnosis not present

## 2018-09-28 DIAGNOSIS — N184 Chronic kidney disease, stage 4 (severe): Secondary | ICD-10-CM | POA: Diagnosis not present

## 2018-09-28 DIAGNOSIS — N2581 Secondary hyperparathyroidism of renal origin: Secondary | ICD-10-CM | POA: Diagnosis not present

## 2018-09-28 DIAGNOSIS — D631 Anemia in chronic kidney disease: Secondary | ICD-10-CM | POA: Diagnosis not present

## 2018-09-29 DIAGNOSIS — N390 Urinary tract infection, site not specified: Secondary | ICD-10-CM | POA: Diagnosis not present

## 2018-10-06 DIAGNOSIS — E039 Hypothyroidism, unspecified: Secondary | ICD-10-CM | POA: Diagnosis not present

## 2018-10-06 DIAGNOSIS — N184 Chronic kidney disease, stage 4 (severe): Secondary | ICD-10-CM | POA: Diagnosis not present

## 2018-10-06 DIAGNOSIS — M25552 Pain in left hip: Secondary | ICD-10-CM | POA: Diagnosis not present

## 2018-10-07 DIAGNOSIS — E039 Hypothyroidism, unspecified: Secondary | ICD-10-CM | POA: Diagnosis not present

## 2018-10-07 NOTE — Progress Notes (Signed)
Please place orders in Epic as patient is being scheduled for a pre-op appointment! Patient DOB-[id number] and surgery date-10/22/2018. Thank you!

## 2018-10-12 NOTE — Progress Notes (Signed)
EKG 04-06-18 EPIC

## 2018-10-12 NOTE — Patient Instructions (Addendum)
Sandra Hunter    Your procedure is scheduled on: 10-22-2018    Report to [facility] Main  Entrance Report to admitting at [facility] 19 TEST ON 10-19-2018 @_______ , THIS TEST MUST BE DONE BEFORE SURGERY, COME TO [place].    Call this number if you have problems the morning of surgery [phone]    Remember:  [place], NO CHEWING GUM CANDY OR MINTS.   NO SOLID FOOD AFTER MIDNIGHT THE NIGHT PRIOR TO SURGERY. NOTHING BY MOUTH EXCEPT CLEAR LIQUIDS UNTIL  430 AM. PLEASE FINISH ENSURE DRINK PER SURGEON ORDER  WHICH NEEDS TO BE COMPLETED AT 430 AM.     CLEAR LIQUID DIET   Foods Allowed                                                                     Foods Excluded  Coffee and tea, regular and decaf                             liquids that you cannot  Plain Jell-O in any flavor                                             see through such as: Fruit ices (not with fruit pulp)                                     milk, soups, orange juice  Iced Popsicles                                    All solid food Carbonated beverages, regular and diet                                    Cranberry, grape and apple juices Sports drinks like Gatorade Lightly seasoned clear broth or consume(fat free) Sugar, honey syrup  Sample Menu Breakfast                                Lunch                                     Supper Cranberry juice                    Beef broth                            Chicken broth Jell-O  Grape juice                           Apple juice Coffee or tea                        Jell-O                                      Popsicle                                                Coffee or tea                        Coffee or  tea  _____________________________________________________________________    Take these medicines the morning of surgery with A SIP OF WATER: AMLODIPIEN (NORVASC), CALCITROL, LEVOTHYROXINE (SYNTHROID)                               You may not have any metal on your body including hair pins and              piercings  Do not wear jewelry, make-up, lotions, powders or perfumes, deodorant             Do not wear nail polish.  Do not shave  48 hours prior to surgery.  .   Do not bring valuables to the hospital. [place].  Contacts, dentures or bridgework may not be worn into surgery. .      _____________________________________________________________________             [facility] - Preparing for Surgery  Before surgery, you can play an important role.   Because skin is not sterile, your skin needs to be as free of germs as possible.   You can reduce the number of germs on your skin by washing with CHG (chlorahexidine gluconate) soap before surgery.  CHG is an antiseptic cleaner which kills germs and bonds with the skin to continue killing germs even after washing. Please DO NOT use if you have an allergy to CHG or antibacterial soaps.  If your skin becomes reddened/irritated stop using the CHG and inform your nurse when you arrive at Short Stay. Do not shave (including legs and underarms) for at least 48 hours prior to the first CHG shower.  You may shave your face/neck.  Please follow these instructions carefully:  1.  Shower with CHG Soap the night before surgery and the  morning of Surgery.  2.  If you choose to wash your hair, wash your hair first as usual with your  normal  shampoo.  3.  After you shampoo, rinse your hair and body thoroughly to remove the  shampoo.                                        4..  Use CHG as you would any other liquid soap.  You can apply  chg directly  to the skin and wash                        Gently with a scrungie or clean washcloth.  5.  Apply the CHG Soap to your body ONLY FROM THE NECK DOWN.   Do not use on face/ open                           Wound or open sores. Avoid contact with eyes, ears mouth and genitals (private parts).                       Wash face,  Genitals (private parts) with your normal soap.             6.  Wash thoroughly, paying special attention to the area where your surgery  will be performed.  7.  Thoroughly rinse your body with warm water from the neck down.  8.  DO NOT shower/wash with your normal soap after using and rinsing off  the CHG Soap.             9.  Pat yourself dry with a clean towel.            10.  Wear clean pajamas.            11.  Place clean sheets on your bed the night of your first shower and do not  sleep with pets.  Day of Surgery : Do not apply any lotions/deodorants the morning of surgery.  Please wear clean clothes to the hospital/surgery center.  FAILURE TO FOLLOW THESE INSTRUCTIONS MAY RESULT IN THE CANCELLATION OF YOUR SURGERY PATIENT SIGNATURE_________________________________  NURSE SIGNATURE__________________________________  ________________________________________________________________________   Sandra Hunter  An incentive spirometer is a tool that can help keep your lungs clear and active. This tool measures how well you are filling your lungs with each breath. Taking long deep breaths may help reverse or decrease the chance of developing breathing (pulmonary) problems (especially infection) following:  A long period of time when you are unable to move or be active. BEFORE THE PROCEDURE   If the spirometer includes an indicator to show your best effort, your nurse or respiratory therapist will set it to a desired goal.  If possible, sit up straight or lean slightly forward. Try not to slouch.  Hold the incentive spirometer in an upright position. INSTRUCTIONS FOR USE  1. Sit on the edge of your bed  if possible, or sit up as far as you can in bed or on a chair. 2. Hold the incentive spirometer in an upright position. 3. Breathe out normally. 4. Place the mouthpiece in your mouth and seal your lips tightly around it. 5. Breathe in slowly and as deeply as possible, raising the piston or the ball toward the top of the column. 6. Hold your breath for 3-5 seconds or for as long as possible. Allow the piston or ball to fall to the bottom of the column. 7. Remove the mouthpiece from your mouth and breathe out normally. 8. Rest for a few seconds and repeat Steps 1 through 7 at least 10 times every 1-2 hours when you are awake. Take your time and take a few normal breaths between deep breaths. 9. The spirometer may include an indicator to show your best effort. Use the indicator as a goal to work toward during each repetition. 10.  After each set of 10 deep breaths, practice coughing to be sure your lungs are clear. If you have an incision (the cut made at the time of surgery), support your incision when coughing by placing a pillow or rolled up towels firmly against it. Once you are able to get out of bed, walk around indoors and cough well. You may stop using the incentive spirometer when instructed by your caregiver.  RISKS AND COMPLICATIONS  Take your time so you do not get dizzy or light-headed.  If you are in pain, you may need to take or ask for pain medication before doing incentive spirometry. It is harder to take a deep breath if you are having pain. AFTER USE  Rest and breathe slowly and easily.  It can be helpful to keep track of a log of your progress. Your caregiver can provide you with a simple table to help with this. If you are using the spirometer at home, follow these instructions: [place] IF:   You are having difficultly using the spirometer.  You have trouble using the spirometer as often as instructed.  Your pain medication is not giving enough relief while  using the spirometer.  You develop fever of 100.5 F (38.1 C) or higher. SEEK IMMEDIATE MEDICAL CARE IF:   You cough up bloody sputum that had not been present before.  You develop fever of 102 F (38.9 C) or greater.  You develop worsening pain at or near the incision site. MAKE SURE YOU:   Understand these instructions.  Will watch your condition.  Will get help right away if you are not doing well or get worse. Document Released: 09/09/2006 Document Revised: 07/22/2011 Document Reviewed: 11/10/2006 ExitCare Patient Information 2014 [employer], [place].   ________________________________________________________________________  WHAT IS A BLOOD TRANSFUSION? Blood Transfusion Information  A transfusion is the replacement of blood or some of its parts. Blood is made up of multiple cells which provide different functions.  Red blood cells carry oxygen and are used for blood loss replacement.  White blood cells fight against infection.  Platelets control bleeding.  Plasma helps clot blood.  Other blood products are available for specialized needs, such as hemophilia or other clotting disorders. BEFORE THE TRANSFUSION  Who gives blood for transfusions?   Healthy volunteers who are fully evaluated to make sure their blood is safe. This is blood bank blood. Transfusion therapy is the safest it has ever been in the practice of medicine. Before blood is taken from a donor, a complete history is taken to make sure that person has no history of diseases nor engages in risky social behavior (examples are intravenous drug use or sexual activity with multiple partners). The donor's travel history is screened to minimize risk of transmitting infections, such as malaria. The donated blood is tested for signs of infectious diseases, such as HIV and hepatitis. The blood is then tested to be sure it is compatible with you in order to minimize the chance of a transfusion reaction. If you or a  relative donates blood, this is often done in anticipation of surgery and is not appropriate for emergency situations. It takes many days to process the donated blood. RISKS AND COMPLICATIONS Although transfusion therapy is very safe and saves many lives, the main dangers of transfusion include:   Getting an infectious disease.  Developing a transfusion reaction. This is an allergic reaction to something in the blood you were given. Every precaution is taken to prevent this. The decision  to have a blood transfusion has been considered carefully by your caregiver before blood is given. Blood is not given unless the benefits outweigh the risks. AFTER THE TRANSFUSION  Right after receiving a blood transfusion, you will usually feel much better and more energetic. This is especially true if your red blood cells have gotten low (anemic). The transfusion raises the level of the red blood cells which carry oxygen, and this usually causes an energy increase.  The nurse administering the transfusion will monitor you carefully for complications. HOME CARE INSTRUCTIONS  No special instructions are needed after a transfusion. You may find your energy is better. Speak with your caregiver about any limitations on activity for underlying diseases you may have. SEEK MEDICAL CARE IF:   Your condition is not improving after your transfusion.  You develop redness or irritation at the intravenous (IV) site. SEEK IMMEDIATE MEDICAL CARE IF:  Any of the following symptoms occur over the next 12 hours:  Shaking chills.  You have a temperature by mouth above 102 F (38.9 C), not controlled by medicine.  Chest, back, or muscle pain.  People around you feel you are not acting correctly or are confused.  Shortness of breath or difficulty breathing.  Dizziness and fainting.  You get a rash or develop hives.  You have a decrease in urine output.  Your urine turns a dark color or changes to pink, red, or  brown. Any of the following symptoms occur over the next 10 days:  You have a temperature by mouth above 102 F (38.9 C), not controlled by medicine.  Shortness of breath.  Weakness after normal activity.  The white part of the eye turns yellow (jaundice).  You have a decrease in the amount of urine or are urinating less often.  Your urine turns a dark color or changes to pink, red, or brown. Document Released: 04/26/2000 Document Revised: 07/22/2011 Document Reviewed: 12/14/2007 [facility] Patient Information 2014 [place], [place].  _______________________________________________________________________

## 2018-10-13 ENCOUNTER — Other Ambulatory Visit: Payer: Self-pay

## 2018-10-13 ENCOUNTER — Encounter (HOSPITAL_COMMUNITY): Payer: Self-pay

## 2018-10-13 ENCOUNTER — Encounter (HOSPITAL_COMMUNITY)
Admission: RE | Admit: 2018-10-13 | Discharge: 2018-10-13 | Disposition: A | Discharge status: Home / Self Care | Payer: Medicare HMO | Source: Ambulatory Visit | Attending: Orthopedic Surgery | Admitting: Orthopedic Surgery

## 2018-10-13 DIAGNOSIS — Z01812 Encounter for preprocedural laboratory examination: Secondary | ICD-10-CM | POA: Insufficient documentation

## 2018-10-13 HISTORY — DX: Unspecified osteoarthritis, unspecified site: M19.90

## 2018-10-13 LAB — BASIC METABOLIC PANEL
Anion gap: 12 (ref 5–15)
BUN: 69 mg/dL — ABNORMAL HIGH (ref 8–23)
CO2: 21 mmol/L — ABNORMAL LOW (ref 22–32)
Calcium: 9.1 mg/dL (ref 8.9–10.3)
Chloride: 107 mmol/L (ref 98–111)
Creatinine, Ser: 2.13 mg/dL — ABNORMAL HIGH (ref 0.44–1.00)
GFR calc Af Amer: 25 mL/min — ABNORMAL LOW (ref >60)
GFR calc non Af Amer: 22 mL/min — ABNORMAL LOW (ref >60)
Glucose, Bld: 89 mg/dL (ref 70–99)
Potassium: 4.5 mmol/L (ref 3.5–5.1)
Sodium: 140 mmol/L (ref 135–145)

## 2018-10-13 LAB — CBC
HCT: 36.2 % (ref 36.0–46.0)
Hemoglobin: 11.1 g/dL — ABNORMAL LOW (ref 12.0–15.0)
MCH: 30.8 pg (ref 26.0–34.0)
MCHC: 30.7 g/dL (ref 30.0–36.0)
MCV: 100.6 fL — ABNORMAL HIGH (ref 80.0–100.0)
Platelets: 507 10*3/uL — ABNORMAL HIGH (ref 150–400)
RBC: 3.6 MIL/uL — ABNORMAL LOW (ref 3.87–5.11)
RDW: 13.4 % (ref 11.5–15.5)
WBC: 8.3 10*3/uL (ref 4.0–10.5)
nRBC: 0 % (ref 0.0–0.2)

## 2018-10-14 LAB — SURGICAL PCR SCREEN
MRSA, PCR: POSITIVE — AB
Staphylococcus aureus: POSITIVE — AB

## 2018-10-14 NOTE — H&P (Signed)
TOTAL HIP CONVERSION ADMISSION H&P  Patient is admitted for conversion of previous ORIF surgery to left total hip arthroplasty.  Subjective:  Chief Complaint: Left hip pain s/p ORIF  HPI: Sandra Hunter, 78 y.o. female, has a history of pain and functional disability in the left hip due to trauma and arthritis and patient has failed non-surgical conservative treatments for greater than 12 weeks to include NSAID's and/or analgesics, use of assistive devices and activity modification. The indications for the revision total hip arthroplasty are failure of previous ORIF.  Onset of symptoms was abrupt starting < 1 year ago with rapidlly worsening course since that time.  Prior procedures on the left hip include ORIF.  Patient currently rates pain in the left hip at 6 out of 10 with activity.  There is worsening of pain with activity and weight bearing, trendelenberg gait, pain that interfers with activities of daily living and pain with passive range of motion. Patient has evidence of previous ORIF hardware by imaging studies.  This condition presents safety issues increasing the risk of falls.    There is no current active infection.  Risks, benefits and expectations were discussed with the patient.  Risks including but not limited to the risk of anesthesia, blood clots, nerve damage, blood vessel damage, failure of the prosthesis, infection and up to and including death.  Patient understand the risks, benefits and expectations and wishes to proceed with surgery.   PCP: [name], MD  D/C Plans:       Home   Post-op Meds:       No Rx given  Tranexamic Acid:      To be given - IV   Decadron:      Is to be given  FYI:      ASA  Norco   No Celebrex (CKD)  DME:   Pt already has equipment  PT:    No PT   Pharmacy:  [facility]   Patient Active Problem List   Diagnosis Date Noted  . Constipation 04/14/2018  . ARF (acute renal failure) ([place])   . Closed fracture of left hip  ([place])   . Malnutrition of moderate degree 04/07/2018  . Femoral neck fracture ([place]) 04/06/2018  . CKD (chronic kidney disease) stage 4, GFR 15-29 ml/min (HCC) 04/06/2018  . CELLULITIS AND ABSCESS OF UPPER ARM AND FOREARM 12/04/2009  . Hypothyroidism 03/22/2009  . MITRAL REGURGITATION 03/22/2009  . Essential hypertension 03/22/2009  . 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 UNDIAGNOSED CARDIAC MURMURS 03/22/2009   Past Medical History:  Diagnosis Date  . Arthritis    fingers  . CKD (chronic kidney disease)   . CKD (chronic kidney disease) stage 4, GFR 15-29 ml/min (HCC) 04/06/2018  . Hypertension    [name]  . Hypothyroidism   . Mitral regurgitation     Past Surgical History:  Procedure Laterality Date  . INTRAMEDULLARY (IM) NAIL INTERTROCHANTERIC Left 04/07/2018   Procedure: INTRAMEDULLARY (IM) NAIL INTERTROCHANTRIC;  Surgeon: Altamese Minkler, MD;  Location: [place];  Service: Orthopedics;  Laterality: Left;  . SEPTOPLASTY  1986  . TOTAL ABDOMINAL HYSTERECTOMY W/ BILATERAL SALPINGOOPHORECTOMY  1990    No current facility-administered medications for this encounter.    Current Outpatient Medications  Medication Sig Dispense Refill Last Dose  . amLODipine (NORVASC) 10 MG tablet Take 10 mg by mouth daily.  5 Past Week at Unknown time  . calcitRIOL (ROCALTROL) 0.25 MCG capsule Take 0.25 mcg by mouth daily.     Marland Kitchen  furosemide (LASIX) 20 MG tablet Take 20 mg by mouth daily.     Marland Kitchen levothyroxine (SYNTHROID) 50 MCG tablet Take 50 mcg by mouth daily before breakfast.      No Known Allergies  Social History   Tobacco Use  . Smoking status: Never Smoker  . Smokeless tobacco: Never Used  Substance Use Topics  . Alcohol use: Yes    Alcohol/week: 21.0 standard drinks    Types: 21 Glasses of wine per week    Comment: 3 glasses of wine daily    Family History  Problem Relation Age of Onset  . Stroke Mother       Review of Systems  Constitutional: Negative.   HENT: Negative.    Eyes: Negative.   Respiratory: Negative.   Cardiovascular: Negative.   Gastrointestinal: Negative.   Genitourinary: Negative.   Musculoskeletal: Positive for joint pain.  Skin: Negative.   Neurological: Negative.   Endo/Heme/Allergies: Negative.   Psychiatric/Behavioral: Negative.     Objective:  Physical Exam  Constitutional: She is oriented to person, place, and time. She appears well-developed.  HENT:  Head: Normocephalic.  Eyes: Pupils are equal, round, and reactive to light.  Neck: Neck supple. No JVD present. No tracheal deviation present. No thyromegaly present.  Cardiovascular: Normal rate, regular rhythm and intact distal pulses.  Respiratory: Effort normal and breath sounds normal. No respiratory distress. She has no wheezes.  GI: Soft. There is no abdominal tenderness. There is no guarding.  Musculoskeletal:     Left hip: She exhibits decreased range of motion, decreased strength, tenderness and bony tenderness. She exhibits no deformity and no laceration (healed previous incision).  Lymphadenopathy:    She has no cervical adenopathy.  Neurological: She is alert and oriented to person, place, and time. A sensory deficit (numbness occ in fingers) is present.  Skin: Skin is warm and dry.  Psychiatric: She has a normal mood and affect.      Labs:  Estimated body mass index is 22.67 kg/m as calculated from the following:   Height as of 10/13/18: 5\' 3"  (1.6 m).   Weight as of 10/13/18: 58.1 kg.  Imaging Review:  Plain radiographs demonstrate moderate degenerative joint disease of the left hip. There is evidence of  failure of the previous ORIF.The bone quality appears to be good for age and reported activity level.    Assessment/Plan:  Left hip with failed previous surgery.  The patient history, physical examination, clinical judgement of the provider and imaging studies are consistent with failure of the left hip(s), previous ORIF. Conversion to total hip  arthroplasty is deemed medically necessary. The treatment options including medical management, injection therapy, arthroscopy and arthroplasty were discussed at length. The risks and benefits of total hip arthroplasty were presented and reviewed. The risks due to aseptic loosening, infection, stiffness, dislocation/subluxation,  thromboembolic complications and other imponderables were discussed.  The patient acknowledged the explanation, agreed to proceed with the plan and consent was signed. Patient is being admitted for inpatient treatment for surgery, pain control, PT, OT, prophylactic antibiotics, VTE prophylaxis, progressive ambulation and ADL's and discharge planning. The patient is planning to be discharged home.    [place] Raja Caputi   PA-C  10/14/2018, 9:13 PM

## 2018-10-15 NOTE — Progress Notes (Signed)
Anesthesia Chart Review   Case:  [id number] Date/Time:  10/22/18 0845   Procedure:  CONVERSION TO TOTAL HIP-anterior approach (Left ) - 120 mins   Anesthesia type:  Spinal   Pre-op diagnosis:  Failed left hip surgery   Location:  [facility]   Surgeon:  Paralee Cancel, MD      DISCUSSION: 78 yo never smoker with h/o hypothyroidism, CKD Stage IV, HTN, mitral regurgitation, failed left hip surgery scheduled for above procedure 10/22/2018 with Dr. Paralee Cancel.   Pt admitted 11/25-12/09/2017 due to left hip fracture due to mechanical fall, IM nail 04/07/18.  During admission developed acute renal failure with fluctuating creatinine.  Pt discharged to SNF. Pt followed up with Dr. [name].   Per PCP, Dr. [name], "Ok to proceed with surgery.  Renal function is stable.  Discussed with Dr. Marval Regal."  Anticipate pt can proceed with planned procedure barring acute status change.  VS: BP (!) 170/93   Pulse 95   Temp 36.4 C (Oral)   Resp 16   Ht 5\' 3"  (1.6 m)   Wt 58.1 kg   SpO2 100%   BMI 22.67 kg/m   PROVIDERS: [name], MD is PCP    LABS: Labs reviewed: Acceptable for surgery. (all labs ordered are listed, but only abnormal results are displayed)  Labs Reviewed  SURGICAL PCR SCREEN - Abnormal; Notable for the following components:      Result Value   MRSA, PCR POSITIVE (*)    Staphylococcus aureus POSITIVE (*)    All other components within normal limits  BASIC METABOLIC PANEL - Abnormal; Notable for the following components:   CO2 21 (*)    BUN 69 (*)    Creatinine, Ser 2.13 (*)    GFR calc non Af Amer 22 (*)    GFR calc Af Amer 25 (*)    All other components within normal limits  CBC - Abnormal; Notable for the following components:   RBC 3.60 (*)    Hemoglobin 11.1 (*)    MCV 100.6 (*)    Platelets 507 (*)    All other components within normal limits  TYPE AND SCREEN     IMAGES:   EKG: 04/07/18 Rate 91 bpm Sinus rhythm  Abnormal R-wave  progression, early transition LVH with secondary repolarization abnormality Baseline wander in lead (s) V1  CV: Echo 03/28/2009 Study Conclusions  1. Left ventricle: The cavity size was normal. Wall thickness was  increased in a pattern of mild LVH. Systolic function was normal.  The estimated ejection fraction was in the range of 55% to 60%.  Wall motion was normal; there were no regional wall motion  abnormalities. Doppler parameters are consistent with abnormal  left ventricular relaxation (grade 1 diastolic dysfunction). 2. Mitral valve: Mildly thickened leaflets . Mild regurgitation. 3. Atrial septum: There was an apparentpatent foramen ovale noted  with left to right shunting by color Doppler. 4. Tricuspid valve: Trivial regurgitation. 5. Pericardium, extracardiac: There was no pericardial effusion. Transthoracic echocardiography. M-mode, complete 2D, spectral Doppler, and color Doppler. Patient status: Outpatient. Location: Echo laboratory. Past Medical History:  Diagnosis Date  . Arthritis    fingers  . CKD (chronic kidney disease)   . CKD (chronic kidney disease) stage 4, GFR 15-29 ml/min (HCC) 04/06/2018  . Hypertension    [name]  . Hypothyroidism   . Mitral regurgitation     Past Surgical History:  Procedure Laterality Date  . INTRAMEDULLARY (IM) NAIL  INTERTROCHANTERIC Left 04/07/2018   Procedure: INTRAMEDULLARY (IM) NAIL INTERTROCHANTRIC;  Surgeon: Altamese [place], MD;  Location: [facility];  Service: Orthopedics;  Laterality: Left;  . SEPTOPLASTY  1986  . TOTAL ABDOMINAL HYSTERECTOMY W/ BILATERAL SALPINGOOPHORECTOMY  1990    MEDICATIONS: . amLODipine (NORVASC) 10 MG tablet  . calcitRIOL (ROCALTROL) 0.25 MCG capsule  . furosemide (LASIX) 20 MG tablet  . levothyroxine (SYNTHROID) 50 MCG tablet   No current facility-administered medications for this encounter.      Maia [facility] Pre-Surgical Testing [phone] 10/20/18 3:13 PM

## 2018-10-19 ENCOUNTER — Other Ambulatory Visit: Payer: Self-pay

## 2018-10-19 ENCOUNTER — Other Ambulatory Visit (HOSPITAL_COMMUNITY)
Admission: RE | Admit: 2018-10-19 | Discharge: 2018-10-19 | Disposition: A | Discharge status: Home / Self Care | Payer: Medicare HMO | Source: Ambulatory Visit | Attending: Orthopedic Surgery | Admitting: Orthopedic Surgery

## 2018-10-19 DIAGNOSIS — Z1159 Encounter for screening for other viral diseases: Secondary | ICD-10-CM | POA: Diagnosis not present

## 2018-10-20 LAB — BPAM RBC
Blood Product Expiration Date: 202006182359
Unit Type and Rh: 6200

## 2018-10-20 LAB — TYPE AND SCREEN
ABO/RH(D): A POS
Antibody Screen: POSITIVE
PT AG Type: NEGATIVE
Unit division: 0

## 2018-10-20 LAB — NOVEL CORONAVIRUS, NAA (HOSP ORDER, SEND-OUT TO REF LAB; TAT 18-24 HRS): SARS-CoV-2, NAA: NOT DETECTED

## 2018-10-20 NOTE — Anesthesia Preprocedure Evaluation (Addendum)
Anesthesia Evaluation  Patient identified by MRN, date of birth, ID band Patient awake    Reviewed: Allergy & Precautions, NPO status , Patient's Chart, lab work & pertinent test results  History of Anesthesia Complications Negative for: history of anesthetic complications  Airway Mallampati: III  TM Distance: >3 FB Neck ROM: Full    Dental  (+) Teeth Intact, Dental Advisory Given, Chipped,    Pulmonary neg pulmonary ROS,    Pulmonary exam normal breath sounds clear to auscultation       Cardiovascular hypertension, Pt. on medications Normal cardiovascular exam+ Valvular Problems/Murmurs MR  Rhythm:Regular Rate:Normal  Echo 03/28/2009 Study Conclusions  1. Left ventricle: The cavity size was normal. Wall thickness wasincreased in a pattern of mild LVH. Systolic function was normal.The estimated ejection fraction was in the range of 55% to 60%.Wall motion was normal; there were no regional wall motionabnormalities. Doppler parameters are consistent with abnormal  left ventricular relaxation (grade 1 diastolic dysfunction). 2. Mitral valve: Mildly thickened leaflets . Mild regurgitation. 3. Atrial septum: There was an apparentpatent foramen ovale notedwith left to right shunting by color Doppler. 4. Tricuspid valve: Trivial regurgitation. 5. Pericardium, extracardiac: There was no pericardial effusion.   Neuro/Psych negative neurological ROS  negative psych ROS   GI/Hepatic negative GI ROS, Neg liver ROS,   Endo/Other  Hypothyroidism   Renal/GU Renal InsufficiencyRenal diseasenegative Renal ROS     Musculoskeletal  (+) Arthritis ,   Abdominal   Peds  Hematology  (+) Blood dyscrasia, anemia , Plt 507k   Anesthesia Other Findings Day of surgery medications reviewed with the patient.  Reproductive/Obstetrics                           Anesthesia Physical Anesthesia Plan  ASA:  III  Anesthesia Plan: Spinal   Post-op Pain Management:    Induction: Intravenous  PONV Risk Score and Plan: 2 and Propofol infusion  Airway Management Planned: Natural Airway and Nasal Cannula  Additional Equipment:   Intra-op Plan:   Post-operative Plan:   Informed Consent: I have reviewed the patients History and Physical, chart, labs and discussed the procedure including the risks, benefits and alternatives for the proposed anesthesia with the patient or authorized representative who has indicated his/her understanding and acceptance.     Dental advisory given  Plan Discussed with: CRNA  Anesthesia Plan Comments:       Anesthesia Quick Evaluation

## 2018-10-21 NOTE — Progress Notes (Signed)
SPOKE W/  _ Laurieanne Vales    SCREENING SYMPTOMS OF COVID 19:   COUGH-- no  RUNNY NOSE--- no   SORE THROAT---no  NASAL CONGESTION----no  SNEEZING---- no SHORTNESS OF BREATH---no  DIFFICULTY BREATHING---no  TEMP >100.0 -----no  UNEXPLAINED BODY ACHES------no  CHILLS -------- no  HEADACHES ---------no  LOSS OF SMELL/ TASTE --------no    HAVE YOU OR ANY FAMILY MEMBER TRAVELLED PAST 14 DAYS OUT OF THE   COUNTY--- no STATE---- no COUNTRY---- no  HAVE YOU OR ANY FAMILY MEMBER BEEN EXPOSED TO ANYONE WITH COVID 19? no

## 2018-10-22 ENCOUNTER — Inpatient Hospital Stay (HOSPITAL_COMMUNITY): Payer: Medicare HMO

## 2018-10-22 ENCOUNTER — Other Ambulatory Visit: Payer: Self-pay

## 2018-10-22 ENCOUNTER — Inpatient Hospital Stay (HOSPITAL_COMMUNITY)
Admission: RE | Admit: 2018-10-22 | Discharge: 2018-10-24 | DRG: 470 | Disposition: A | Discharge status: Home / Self Care | Payer: Medicare HMO | Attending: Orthopedic Surgery | Admitting: Orthopedic Surgery

## 2018-10-22 ENCOUNTER — Encounter (HOSPITAL_COMMUNITY): Payer: Self-pay | Admitting: Certified Registered Nurse Anesthetist

## 2018-10-22 ENCOUNTER — Inpatient Hospital Stay (HOSPITAL_COMMUNITY): Payer: Medicare HMO | Admitting: Physician Assistant

## 2018-10-22 ENCOUNTER — Encounter (HOSPITAL_COMMUNITY)
Admission: RE | Disposition: A | Discharge status: Home / Self Care | Payer: Self-pay | Source: Home / Self Care | Attending: Orthopedic Surgery

## 2018-10-22 ENCOUNTER — Inpatient Hospital Stay (HOSPITAL_COMMUNITY): Payer: Medicare HMO | Admitting: Anesthesiology

## 2018-10-22 DIAGNOSIS — R42 Dizziness and giddiness: Secondary | ICD-10-CM | POA: Diagnosis not present

## 2018-10-22 DIAGNOSIS — T84115A Breakdown (mechanical) of internal fixation device of left femur, initial encounter: Secondary | ICD-10-CM | POA: Diagnosis not present

## 2018-10-22 DIAGNOSIS — Z471 Aftercare following joint replacement surgery: Secondary | ICD-10-CM | POA: Diagnosis not present

## 2018-10-22 DIAGNOSIS — Z96649 Presence of unspecified artificial hip joint: Secondary | ICD-10-CM

## 2018-10-22 DIAGNOSIS — M19049 Primary osteoarthritis, unspecified hand: Secondary | ICD-10-CM | POA: Diagnosis present

## 2018-10-22 DIAGNOSIS — Y793 Surgical instruments, materials and orthopedic devices (including sutures) associated with adverse incidents: Secondary | ICD-10-CM | POA: Diagnosis present

## 2018-10-22 DIAGNOSIS — D62 Acute posthemorrhagic anemia: Secondary | ICD-10-CM | POA: Diagnosis not present

## 2018-10-22 DIAGNOSIS — Z823 Family history of stroke: Secondary | ICD-10-CM | POA: Diagnosis not present

## 2018-10-22 DIAGNOSIS — Z90710 Acquired absence of both cervix and uterus: Secondary | ICD-10-CM

## 2018-10-22 DIAGNOSIS — I340 Nonrheumatic mitral (valve) insufficiency: Secondary | ICD-10-CM | POA: Diagnosis not present

## 2018-10-22 DIAGNOSIS — Z96642 Presence of left artificial hip joint: Secondary | ICD-10-CM | POA: Diagnosis not present

## 2018-10-22 DIAGNOSIS — Z79899 Other long term (current) drug therapy: Secondary | ICD-10-CM

## 2018-10-22 DIAGNOSIS — T8484XA Pain due to internal orthopedic prosthetic devices, implants and grafts, initial encounter: Principal | ICD-10-CM | POA: Diagnosis present

## 2018-10-22 DIAGNOSIS — N184 Chronic kidney disease, stage 4 (severe): Secondary | ICD-10-CM | POA: Diagnosis present

## 2018-10-22 DIAGNOSIS — E039 Hypothyroidism, unspecified: Secondary | ICD-10-CM | POA: Diagnosis not present

## 2018-10-22 DIAGNOSIS — M1612 Unilateral primary osteoarthritis, left hip: Secondary | ICD-10-CM | POA: Diagnosis present

## 2018-10-22 DIAGNOSIS — Z419 Encounter for procedure for purposes other than remedying health state, unspecified: Secondary | ICD-10-CM

## 2018-10-22 DIAGNOSIS — Z79890 Hormone replacement therapy (postmenopausal): Secondary | ICD-10-CM

## 2018-10-22 DIAGNOSIS — I129 Hypertensive chronic kidney disease with stage 1 through stage 4 chronic kidney disease, or unspecified chronic kidney disease: Secondary | ICD-10-CM | POA: Diagnosis not present

## 2018-10-22 DIAGNOSIS — Z90722 Acquired absence of ovaries, bilateral: Secondary | ICD-10-CM | POA: Diagnosis not present

## 2018-10-22 DIAGNOSIS — T84011A Broken internal left hip prosthesis, initial encounter: Secondary | ICD-10-CM | POA: Diagnosis not present

## 2018-10-22 HISTORY — PX: CONVERSION TO TOTAL HIP: SHX5784

## 2018-10-22 SURGERY — CONVERSION, PREVIOUS HIP SURGERY, TO TOTAL HIP ARTHROPLASTY
Anesthesia: Spinal | Site: Hip | Laterality: Left

## 2018-10-22 MED ORDER — HYDROMORPHONE HCL 1 MG/ML IJ SOLN
0.2500 mg | INTRAMUSCULAR | Status: DC | PRN
  Administered 2018-10-22 (×2): 0.5 mg via INTRAVENOUS

## 2018-10-22 MED ORDER — PROPOFOL 10 MG/ML IV BOLUS
INTRAVENOUS | Status: DC | PRN
  Administered 2018-10-22 (×2): 30 mg via INTRAVENOUS
  Administered 2018-10-22 (×2): 40 mg via INTRAVENOUS

## 2018-10-22 MED ORDER — ACETAMINOPHEN 500 MG PO TABS
1000.0000 mg | ORAL_TABLET | Freq: Once | ORAL | Status: AC
  Administered 2018-10-22: 07:00:00 1000 mg via ORAL

## 2018-10-22 MED ORDER — HYDROMORPHONE HCL 1 MG/ML IJ SOLN: 0.5000 mg | INTRAMUSCULAR | Status: DC | PRN

## 2018-10-22 MED ORDER — PHENYLEPHRINE HCL (PRESSORS) 10 MG/ML IV SOLN
INTRAVENOUS | Status: AC
  Filled 2018-10-22: qty 1

## 2018-10-22 MED ORDER — DEXAMETHASONE SODIUM PHOSPHATE 10 MG/ML IJ SOLN
10.0000 mg | Freq: Once | INTRAMUSCULAR | Status: AC
  Administered 2018-10-23: 10 mg via INTRAVENOUS
  Filled 2018-10-22: qty 1

## 2018-10-22 MED ORDER — ASPIRIN 81 MG PO CHEW
81.0000 mg | CHEWABLE_TABLET | Freq: Two times a day (BID) | ORAL | Status: DC
  Administered 2018-10-22 – 2018-10-24 (×4): 81 mg via ORAL
  Filled 2018-10-22 (×4): qty 1

## 2018-10-22 MED ORDER — METOCLOPRAMIDE HCL 5 MG PO TABS: 5.0000 mg | ORAL_TABLET | Freq: Three times a day (TID) | ORAL | Status: DC | PRN

## 2018-10-22 MED ORDER — FENTANYL CITRATE (PF) 100 MCG/2ML IJ SOLN
INTRAMUSCULAR | Status: AC
  Filled 2018-10-22: qty 2

## 2018-10-22 MED ORDER — ONDANSETRON HCL 4 MG/2ML IJ SOLN
INTRAMUSCULAR | Status: AC
  Filled 2018-10-22: qty 2

## 2018-10-22 MED ORDER — FUROSEMIDE 20 MG PO TABS
20.0000 mg | ORAL_TABLET | Freq: Every day | ORAL | Status: DC
  Administered 2018-10-22 – 2018-10-24 (×3): 20 mg via ORAL
  Filled 2018-10-22 (×3): qty 1

## 2018-10-22 MED ORDER — HYDROCODONE-ACETAMINOPHEN 7.5-325 MG PO TABS
1.0000 | ORAL_TABLET | ORAL | Status: DC | PRN
  Administered 2018-10-22: 17:00:00 1 via ORAL
  Filled 2018-10-22: qty 1

## 2018-10-22 MED ORDER — CALCITRIOL 0.25 MCG PO CAPS
0.2500 ug | ORAL_CAPSULE | Freq: Every day | ORAL | Status: DC
  Administered 2018-10-23 – 2018-10-24 (×2): 0.25 ug via ORAL
  Filled 2018-10-22 (×2): qty 1

## 2018-10-22 MED ORDER — CEFAZOLIN SODIUM-DEXTROSE 2-4 GM/100ML-% IV SOLN: 2.0000 g | Freq: Four times a day (QID) | INTRAVENOUS | Status: DC

## 2018-10-22 MED ORDER — DEXAMETHASONE SODIUM PHOSPHATE 10 MG/ML IJ SOLN
10.0000 mg | Freq: Once | INTRAMUSCULAR | Status: AC
  Administered 2018-10-22: 09:00:00 10 mg via INTRAVENOUS

## 2018-10-22 MED ORDER — FENTANYL CITRATE (PF) 100 MCG/2ML IJ SOLN
INTRAMUSCULAR | Status: DC | PRN
  Administered 2018-10-22 (×2): 50 ug via INTRAVENOUS

## 2018-10-22 MED ORDER — HYDROCODONE-ACETAMINOPHEN 5-325 MG PO TABS
1.0000 | ORAL_TABLET | ORAL | Status: DC | PRN
  Administered 2018-10-22 – 2018-10-24 (×9): 2 via ORAL
  Filled 2018-10-22 (×10): qty 2

## 2018-10-22 MED ORDER — PROPOFOL 10 MG/ML IV BOLUS
INTRAVENOUS | Status: AC
  Filled 2018-10-22: qty 60

## 2018-10-22 MED ORDER — VANCOMYCIN HCL IN DEXTROSE 1-5 GM/200ML-% IV SOLN
1000.0000 mg | Freq: Once | INTRAVENOUS | Status: AC
  Administered 2018-10-22: 1000 mg via INTRAVENOUS
  Filled 2018-10-22: qty 200

## 2018-10-22 MED ORDER — METHOCARBAMOL 500 MG IVPB - SIMPLE MED
INTRAVENOUS | Status: AC
  Filled 2018-10-22: qty 50

## 2018-10-22 MED ORDER — ACETAMINOPHEN 500 MG PO TABS
ORAL_TABLET | ORAL | Status: AC
  Filled 2018-10-22: qty 2

## 2018-10-22 MED ORDER — BISACODYL 10 MG RE SUPP: 10.0000 mg | Freq: Every day | RECTAL | Status: DC | PRN

## 2018-10-22 MED ORDER — KETOROLAC TROMETHAMINE 30 MG/ML IJ SOLN
30.0000 mg | Freq: Once | INTRAMUSCULAR | Status: AC
  Administered 2018-10-22: 14:00:00 30 mg via INTRAVENOUS

## 2018-10-22 MED ORDER — DOCUSATE SODIUM 100 MG PO CAPS
100.0000 mg | ORAL_CAPSULE | Freq: Two times a day (BID) | ORAL | Status: DC
  Administered 2018-10-22 – 2018-10-24 (×4): 100 mg via ORAL
  Filled 2018-10-22 (×4): qty 1

## 2018-10-22 MED ORDER — ALUM & MAG HYDROXIDE-SIMETH 200-200-20 MG/5ML PO SUSP: 15.0000 mL | ORAL | Status: DC | PRN

## 2018-10-22 MED ORDER — ALBUMIN HUMAN 5 % IV SOLN
INTRAVENOUS | Status: DC | PRN
  Administered 2018-10-22: 10:00:00 via INTRAVENOUS

## 2018-10-22 MED ORDER — SODIUM CHLORIDE 0.9 % IV SOLN
INTRAVENOUS | Status: DC | PRN
  Administered 2018-10-22: 50 ug/min via INTRAVENOUS

## 2018-10-22 MED ORDER — HYDROMORPHONE HCL 1 MG/ML IJ SOLN
INTRAMUSCULAR | Status: AC
  Filled 2018-10-22: qty 1

## 2018-10-22 MED ORDER — ONDANSETRON HCL 4 MG/2ML IJ SOLN: 4.0000 mg | Freq: Four times a day (QID) | INTRAMUSCULAR | Status: DC | PRN

## 2018-10-22 MED ORDER — METOCLOPRAMIDE HCL 5 MG/ML IJ SOLN: 5.0000 mg | Freq: Three times a day (TID) | INTRAMUSCULAR | Status: DC | PRN

## 2018-10-22 MED ORDER — PROPOFOL 10 MG/ML IV BOLUS
INTRAVENOUS | Status: AC
  Filled 2018-10-22: qty 20

## 2018-10-22 MED ORDER — ONDANSETRON HCL 4 MG PO TABS: 4.0000 mg | ORAL_TABLET | Freq: Four times a day (QID) | ORAL | Status: DC | PRN

## 2018-10-22 MED ORDER — ACETAMINOPHEN 10 MG/ML IV SOLN
INTRAVENOUS | Status: AC
  Filled 2018-10-22: qty 100

## 2018-10-22 MED ORDER — CHLORHEXIDINE GLUCONATE 4 % EX LIQD: 60.0000 mL | Freq: Once | CUTANEOUS | Status: DC

## 2018-10-22 MED ORDER — ACETAMINOPHEN 10 MG/ML IV SOLN
1000.0000 mg | Freq: Once | INTRAVENOUS | Status: AC
  Administered 2018-10-22: 14:00:00 1000 mg via INTRAVENOUS

## 2018-10-22 MED ORDER — BUPIVACAINE IN DEXTROSE 0.75-8.25 % IT SOLN
INTRATHECAL | Status: DC | PRN
  Administered 2018-10-22: 1.8 mL via INTRATHECAL

## 2018-10-22 MED ORDER — PHENYLEPHRINE 40 MCG/ML (10ML) SYRINGE FOR IV PUSH (FOR BLOOD PRESSURE SUPPORT)
PREFILLED_SYRINGE | INTRAVENOUS | Status: AC
  Filled 2018-10-22: qty 10

## 2018-10-22 MED ORDER — DEXAMETHASONE SODIUM PHOSPHATE 10 MG/ML IJ SOLN
INTRAMUSCULAR | Status: AC
  Filled 2018-10-22: qty 1

## 2018-10-22 MED ORDER — MAGNESIUM CITRATE PO SOLN: 1.0000 | Freq: Once | ORAL | Status: DC | PRN

## 2018-10-22 MED ORDER — LEVOTHYROXINE SODIUM 50 MCG PO TABS
50.0000 ug | ORAL_TABLET | Freq: Every day | ORAL | Status: DC
  Administered 2018-10-23 – 2018-10-24 (×2): 50 ug via ORAL
  Filled 2018-10-22 (×2): qty 1

## 2018-10-22 MED ORDER — HYDROMORPHONE HCL 1 MG/ML IJ SOLN
0.2500 mg | INTRAMUSCULAR | Status: AC | PRN
  Administered 2018-10-22 (×2): 0.5 mg via INTRAVENOUS

## 2018-10-22 MED ORDER — CEFAZOLIN SODIUM-DEXTROSE 1-4 GM/50ML-% IV SOLN
1.0000 g | Freq: Two times a day (BID) | INTRAVENOUS | Status: AC
  Administered 2018-10-22: 1 g via INTRAVENOUS
  Filled 2018-10-22: qty 50

## 2018-10-22 MED ORDER — TRANEXAMIC ACID-NACL 1000-0.7 MG/100ML-% IV SOLN
1000.0000 mg | Freq: Once | INTRAVENOUS | Status: AC
  Administered 2018-10-22: 1000 mg via INTRAVENOUS
  Filled 2018-10-22: qty 100

## 2018-10-22 MED ORDER — 0.9 % SODIUM CHLORIDE (POUR BTL) OPTIME
TOPICAL | Status: DC | PRN
  Administered 2018-10-22: 1000 mL

## 2018-10-22 MED ORDER — LACTATED RINGERS IV SOLN
INTRAVENOUS | Status: DC
  Administered 2018-10-22 (×2): via INTRAVENOUS

## 2018-10-22 MED ORDER — FERROUS SULFATE 325 (65 FE) MG PO TABS
325.0000 mg | ORAL_TABLET | Freq: Three times a day (TID) | ORAL | Status: DC
  Administered 2018-10-22 – 2018-10-24 (×7): 325 mg via ORAL
  Filled 2018-10-22 (×7): qty 1

## 2018-10-22 MED ORDER — FENTANYL CITRATE (PF) 100 MCG/2ML IJ SOLN
25.0000 ug | INTRAMUSCULAR | Status: DC | PRN
  Administered 2018-10-22 (×3): 50 ug via INTRAVENOUS

## 2018-10-22 MED ORDER — POLYETHYLENE GLYCOL 3350 17 G PO PACK
17.0000 g | PACK | Freq: Two times a day (BID) | ORAL | Status: DC
  Administered 2018-10-23 – 2018-10-24 (×2): 17 g via ORAL
  Filled 2018-10-22 (×3): qty 1

## 2018-10-22 MED ORDER — PHENYLEPHRINE 40 MCG/ML (10ML) SYRINGE FOR IV PUSH (FOR BLOOD PRESSURE SUPPORT)
PREFILLED_SYRINGE | INTRAVENOUS | Status: DC | PRN
  Administered 2018-10-22 (×3): 80 ug via INTRAVENOUS

## 2018-10-22 MED ORDER — KETOROLAC TROMETHAMINE 30 MG/ML IJ SOLN
INTRAMUSCULAR | Status: AC
  Filled 2018-10-22: qty 1

## 2018-10-22 MED ORDER — AMLODIPINE BESYLATE 10 MG PO TABS
10.0000 mg | ORAL_TABLET | Freq: Every day | ORAL | Status: DC
  Administered 2018-10-23 – 2018-10-24 (×2): 10 mg via ORAL
  Filled 2018-10-22 (×2): qty 1

## 2018-10-22 MED ORDER — METHOCARBAMOL 500 MG PO TABS
500.0000 mg | ORAL_TABLET | Freq: Four times a day (QID) | ORAL | Status: DC | PRN
  Administered 2018-10-23 – 2018-10-24 (×6): 500 mg via ORAL
  Filled 2018-10-22 (×6): qty 1

## 2018-10-22 MED ORDER — ALBUMIN HUMAN 5 % IV SOLN
INTRAVENOUS | Status: AC
  Filled 2018-10-22: qty 250

## 2018-10-22 MED ORDER — METHOCARBAMOL 500 MG IVPB - SIMPLE MED
500.0000 mg | Freq: Four times a day (QID) | INTRAVENOUS | Status: DC | PRN
  Administered 2018-10-22: 12:00:00 500 mg via INTRAVENOUS
  Filled 2018-10-22: qty 50

## 2018-10-22 MED ORDER — ONDANSETRON HCL 4 MG/2ML IJ SOLN: 4.0000 mg | Freq: Once | INTRAMUSCULAR | Status: DC | PRN

## 2018-10-22 MED ORDER — ACETAMINOPHEN 325 MG PO TABS: 325.0000 mg | ORAL_TABLET | Freq: Four times a day (QID) | ORAL | Status: DC | PRN

## 2018-10-22 MED ORDER — DIPHENHYDRAMINE HCL 12.5 MG/5ML PO ELIX: 12.5000 mg | ORAL_SOLUTION | ORAL | Status: DC | PRN

## 2018-10-22 MED ORDER — PHENOL 1.4 % MT LIQD
1.0000 | OROMUCOSAL | Status: DC | PRN
  Filled 2018-10-22: qty 177

## 2018-10-22 MED ORDER — TRANEXAMIC ACID-NACL 1000-0.7 MG/100ML-% IV SOLN
1000.0000 mg | INTRAVENOUS | Status: AC
  Administered 2018-10-22: 09:00:00 1000 mg via INTRAVENOUS
  Filled 2018-10-22: qty 100

## 2018-10-22 MED ORDER — PROPOFOL 500 MG/50ML IV EMUL
INTRAVENOUS | Status: DC | PRN
  Administered 2018-10-22: 100 ug/kg/min via INTRAVENOUS

## 2018-10-22 MED ORDER — ONDANSETRON HCL 4 MG/2ML IJ SOLN
INTRAMUSCULAR | Status: DC | PRN
  Administered 2018-10-22: 4 mg via INTRAVENOUS

## 2018-10-22 MED ORDER — MENTHOL 3 MG MT LOZG: 1.0000 | LOZENGE | OROMUCOSAL | Status: DC | PRN

## 2018-10-22 MED ORDER — SODIUM CHLORIDE 0.9 % IV SOLN
INTRAVENOUS | Status: DC
  Administered 2018-10-22 – 2018-10-23 (×4): via INTRAVENOUS

## 2018-10-22 MED ORDER — CEFAZOLIN SODIUM-DEXTROSE 2-4 GM/100ML-% IV SOLN
2.0000 g | INTRAVENOUS | Status: AC
  Administered 2018-10-22: 09:00:00 2 g via INTRAVENOUS
  Filled 2018-10-22: qty 100

## 2018-10-22 SURGICAL SUPPLY — 44 items
BAG ZIPLOCK 12X15 (MISCELLANEOUS) ×3 IMPLANT
BLADE SAW SGTL 18X1.27X75 (BLADE) ×2 IMPLANT
BLADE SAW SGTL 18X1.27X75MM (BLADE) ×1
COVER SURGICAL LIGHT HANDLE (MISCELLANEOUS) ×3 IMPLANT
COVER WAND RF STERILE (DRAPES) IMPLANT
CUP ACETBLR 52 OD PINNACLE (Hips) ×3 IMPLANT
DERMABOND ADVANCED (GAUZE/BANDAGES/DRESSINGS) ×4
DERMABOND ADVANCED .7 DNX12 (GAUZE/BANDAGES/DRESSINGS) ×2 IMPLANT
DRAPE U-SHAPE 47X51 STRL (DRAPES) ×6 IMPLANT
DRESSING AQUACEL AG SP 3.5X10 (GAUZE/BANDAGES/DRESSINGS) IMPLANT
DRSG AQUACEL AG ADV 3.5X 6 (GAUZE/BANDAGES/DRESSINGS) ×6 IMPLANT
DRSG AQUACEL AG SP 3.5X10 (GAUZE/BANDAGES/DRESSINGS)
DURAPREP 26ML APPLICATOR (WOUND CARE) ×3 IMPLANT
ELECT REM PT RETURN 15FT ADLT (MISCELLANEOUS) ×3 IMPLANT
ELIMINATOR HOLE APEX DEPUY (Hips) ×3 IMPLANT
GLOVE BIOGEL PI IND STRL 7.5 (GLOVE) ×1 IMPLANT
GLOVE BIOGEL PI IND STRL 8.5 (GLOVE) ×1 IMPLANT
GLOVE BIOGEL PI INDICATOR 7.5 (GLOVE) ×2
GLOVE BIOGEL PI INDICATOR 8.5 (GLOVE) ×2
GLOVE ECLIPSE 8.0 STRL XLNG CF (GLOVE) ×3 IMPLANT
GLOVE ORTHO TXT STRL SZ7.5 (GLOVE) ×6 IMPLANT
GOWN STRL REUS W/TWL 2XL LVL3 (GOWN DISPOSABLE) ×3 IMPLANT
GOWN STRL REUS W/TWL LRG LVL3 (GOWN DISPOSABLE) ×3 IMPLANT
HEAD M SROM 36MM PLUS 1.5 (Hips) ×1 IMPLANT
KIT TURNOVER KIT A (KITS) IMPLANT
LINER NEUTRAL 52X36MM PLUS 4 (Liner) ×3 IMPLANT
MANIFOLD NEPTUNE II (INSTRUMENTS) ×3 IMPLANT
NS IRRIG 1000ML POUR BTL (IV SOLUTION) ×3 IMPLANT
PROTECTOR NERVE ULNAR (MISCELLANEOUS) ×3 IMPLANT
SCREW 6.5MMX30MM (Screw) ×3 IMPLANT
SCREW 6.5MMX35MM (Screw) ×3 IMPLANT
SROM M HEAD 36MM PLUS 1.5 (Hips) ×3 IMPLANT
STEM FEMORAL SZ 6MM STD ACTIS (Stem) ×3 IMPLANT
SUT MNCRL AB 4-0 PS2 18 (SUTURE) ×6 IMPLANT
SUT STRATAFIX 0 PDS 27 VIOLET (SUTURE) ×3
SUT VIC AB 1 CT1 27 (SUTURE) ×2
SUT VIC AB 1 CT1 27XBRD ANTBC (SUTURE) ×1 IMPLANT
SUT VIC AB 1 CT1 36 (SUTURE) ×9 IMPLANT
SUT VIC AB 2-0 CT1 27 (SUTURE) ×8
SUT VIC AB 2-0 CT1 TAPERPNT 27 (SUTURE) ×4 IMPLANT
SUTURE STRATFX 0 PDS 27 VIOLET (SUTURE) ×1 IMPLANT
TRAY FOLEY MTR SLVR 16FR STAT (SET/KITS/TRAYS/PACK) ×3 IMPLANT
WATER STERILE IRR 1000ML POUR (IV SOLUTION) ×6 IMPLANT
YANKAUER SUCT BULB TIP 10FT TU (MISCELLANEOUS) IMPLANT

## 2018-10-22 NOTE — Anesthesia Procedure Notes (Signed)
Spinal  Patient location during procedure: OR Start time: 10/22/2018 8:24 AM End time: 10/22/2018 8:26 AM Staffing Resident/CRNA: [place] Leanne Sisler C, CRNA Performed: resident/CRNA  Preanesthetic Checklist Completed: patient identified, site marked, surgical consent, pre-op evaluation, timeout performed, IV checked, risks and benefits discussed and monitors and equipment checked Spinal Block Patient position: sitting Prep: DuraPrep Patient monitoring: heart rate, cardiac monitor, continuous pulse ox and blood pressure Approach: midline Location: L3-4 Injection technique: single-shot Needle Needle type: Pencan  Needle gauge: 24 G Needle length: 9 cm Assessment Sensory level: T4 Additional Notes IV functioning, monitors applied to pt. Expiration date of kit checked and confirmed to be in date. Sterile prep and drape, hand hygiene and sterile gloved used. Pt was positioned and spine was prepped in sterile fashion. Skin was anesthetized with lidocaine. Free flow of clear CSF obtained prior to injecting local anesthetic into CSF x 1 attempt. Spinal needle aspirated freely following injection. Needle was carefully withdrawn, and pt tolerated procedure well. Loss of motor and sensory on exam post injection.

## 2018-10-22 NOTE — Progress Notes (Signed)
PHARMACY NOTE:  ANTIMICROBIAL RENAL DOSAGE ADJUSTMENT  Current antimicrobial regimen includes a mismatch between antimicrobial dosage and estimated renal function.  As per policy approved by the Pharmacy & Therapeutics and Medical Executive Committees, the antimicrobial dosage will be adjusted accordingly.  Current antimicrobial dosage:  Ancef 2gm q6h x2 post-op  Indication: surgical prophylaxis  Renal Function:  Estimated Creatinine Clearance: 18.6 mL/min (A) (by C-G formula based on SCr of 2.13 mg/dL (H)). []      On intermittent HD, scheduled: []      On CRRT    Antimicrobial dosage has been changed to:  1gm IV q12h x1 dose   Thank you for allowing pharmacy to be a part of this patient's care.  Lynelle Doctor, [facility] 10/22/2018 3:19 PM

## 2018-10-22 NOTE — Plan of Care (Signed)
Reviewed pain medication schedule and use of call bell for needs/safety

## 2018-10-22 NOTE — Discharge Instructions (Addendum)
INSTRUCTIONS AFTER JOINT REPLACEMENT   o Remove items at home which could result in a fall. This includes throw rugs or furniture in walking pathways o ICE to the affected joint every three hours while awake for 30 minutes at a time, for at least the first 3-5 days, and then as needed for pain and swelling.  Continue to use ice for pain and swelling. You may notice swelling that will progress down to the foot and ankle.  This is normal after surgery.  Elevate your leg when you are not up walking on it.   o Continue to use the breathing machine you got in the hospital (incentive spirometer) which will help keep your temperature down.  It is common for your temperature to cycle up and down following surgery, especially at night when you are not up moving around and exerting yourself.  The breathing machine keeps your lungs expanded and your temperature down.   DIET:  As you were doing prior to hospitalization, we recommend a well-balanced diet.  DRESSING / WOUND CARE / SHOWERING  Keep the surgical dressing until follow up.  The dressing is water proof, so you can shower without any extra covering.  IF THE DRESSING FALLS OFF or the wound gets wet inside, change the dressing with sterile gauze.  Please use good hand washing techniques before changing the dressing.  Do not use any lotions or creams on the incision until instructed by your surgeon.    ACTIVITY  o Increase activity slowly as tolerated, but follow the weight bearing instructions below.   o No driving for 6 weeks or until further direction given by your physician.  You cannot drive while taking narcotics.  o No lifting or carrying greater than 10 lbs. until further directed by your surgeon. o Avoid periods of inactivity such as sitting longer than an hour when not asleep. This helps prevent blood clots.  o You may return to work once you are authorized by your doctor.     WEIGHT BEARING   Partial weight bearing with assist device as  directed.  50% right lower extremity   EXERCISES  Results after joint replacement surgery are often greatly improved when you follow the exercise, range of motion and muscle strengthening exercises prescribed by your doctor. Safety measures are also important to protect the joint from further injury. Any time any of these exercises cause you to have increased pain or swelling, decrease what you are doing until you are comfortable again and then slowly increase them. If you have problems or questions, call your caregiver or physical therapist for advice.   Rehabilitation is important following a joint replacement. After just a few days of immobilization, the muscles of the leg can become weakened and shrink (atrophy).  These exercises are designed to build up the tone and strength of the thigh and leg muscles and to improve motion. Often times heat used for twenty to thirty minutes before working out will loosen up your tissues and help with improving the range of motion but do not use heat for the first two weeks following surgery (sometimes heat can increase post-operative swelling).   These exercises can be done on a training (exercise) mat, on the floor, on a table or on a bed. Use whatever works the best and is most comfortable for you.    Use music or television while you are exercising so that the exercises are a pleasant break in your day. This will make your life better  with the exercises acting as a break in your routine that you can look forward to.   Perform all exercises about fifteen times, three times per day or as directed.  You should exercise both the operative leg and the other leg as well.  Exercises include:    Quad Sets - Tighten up the muscle on the front of the thigh (Quad) and hold for 5-10 seconds.    Straight Leg Raises - With your knee straight (if you were given a brace, keep it on), lift the leg to 60 degrees, hold for 3 seconds, and slowly lower the leg.  Perform this  exercise against resistance later as your leg gets stronger.   Leg Slides: Lying on your back, slowly slide your foot toward your buttocks, bending your knee up off the floor (only go as far as is comfortable). Then slowly slide your foot back down until your leg is flat on the floor again.   Angel Wings: Lying on your back spread your legs to the side as far apart as you can without causing discomfort.   Hamstring Strength:  Lying on your back, push your heel against the floor with your leg straight by tightening up the muscles of your buttocks.  Repeat, but this time bend your knee to a comfortable angle, and push your heel against the floor.  You may put a pillow under the heel to make it more comfortable if necessary.   A rehabilitation program following joint replacement surgery can speed recovery and prevent re-injury in the future due to weakened muscles. Contact your doctor or a physical therapist for more information on knee rehabilitation.    CONSTIPATION  Constipation is defined medically as fewer than three stools per week and severe constipation as less than one stool per week.  Even if you have a regular bowel pattern at home, your normal regimen is likely to be disrupted due to multiple reasons following surgery.  Combination of anesthesia, postoperative narcotics, change in appetite and fluid intake all can affect your bowels.   YOU MUST use at least one of the following options; they are listed in order of increasing strength to get the job done.  They are all available over the counter, and you may need to use some, POSSIBLY even all of these options:    Drink plenty of fluids (prune juice may be helpful) and high fiber foods Colace 100 mg by mouth twice a day  Senokot for constipation as directed and as needed Dulcolax (bisacodyl), take with full glass of water  Miralax (polyethylene glycol) once or twice a day as needed.  If you have tried all these things and are unable to  have a bowel movement in the first 3-4 days after surgery call either your surgeon or your primary doctor.    If you experience loose stools or diarrhea, hold the medications until you stool forms back up.  If your symptoms do not get better within 1 week or if they get worse, check with your doctor.  If you experience "the worst abdominal pain ever" or develop nausea or vomiting, please contact the office immediately for further recommendations for treatment.   ITCHING:  If you experience itching with your medications, try taking only a single pain pill, or even half a pain pill at a time.  You can also use Benadryl over the counter for itching or also to help with sleep.   TED HOSE STOCKINGS:  Use stockings on both legs until  for at least 2 weeks or as directed by physician office. They may be removed at night for sleeping.  MEDICATIONS:  See your medication summary on the After Visit Summary that nursing will review with you.  You may have some home medications which will be placed on hold until you complete the course of blood thinner medication.  It is important for you to complete the blood thinner medication as prescribed.  PRECAUTIONS:  If you experience chest pain or shortness of breath - call 911 immediately for transfer to the hospital emergency department.   If you develop a fever greater that 101 F, purulent drainage from wound, increased redness or drainage from wound, foul odor from the wound/dressing, or calf pain - CONTACT YOUR SURGEON.                                                   FOLLOW-UP APPOINTMENTS:  If you do not already have a post-op appointment, please call the office for an appointment to be seen by your surgeon.  Guidelines for how soon to be seen are listed in your After Visit Summary, but are typically between 1-4 weeks after surgery.  OTHER INSTRUCTIONS:   Knee Replacement:  Do not place pillow under knee, focus on keeping the knee straight while resting.    MAKE SURE YOU:   Understand these instructions.   Get help right away if you are not doing well or get worse.    Thank you for letting us be a part of your medical care team.  It is a privilege we respect greatly.  We hope these instructions will help you stay on track for a fast and full recovery!

## 2018-10-22 NOTE — Brief Op Note (Signed)
10/22/2018  8:55 AM  PATIENT:  Sandra Hunter  78 y.o. female  PRE-OPERATIVE DIAGNOSIS:  Failed left hip surgery  POST-OPERATIVE DIAGNOSIS:  Failed left hip surgery prior ORIF left intertrochanteric hip fracture  PROCEDURE:  Procedure(s): CONVERSION TO TOTAL HIP-anterior approach (Left)  SURGEON:  Surgeon(s) and Role:    Paralee Cancel, MD - Primary  PHYSICIAN ASSISTANT: Danae Orleans, PA-C  ANESTHESIA:   spinal  EBL:  500 cc  BLOOD ADMINISTERED:none  DRAINS: none   LOCAL MEDICATIONS USED:  NONE  SPECIMEN:  No Specimen  DISPOSITION OF SPECIMEN:  N/A  COUNTS:  YES  TOURNIQUET:  * No tourniquets in log *  DICTATION: .Other Dictation: Dictation Number [id number]  PLAN OF CARE: Admit to inpatient   PATIENT DISPOSITION:  PACU - hemodynamically stable.   Delay start of Pharmacological VTE agent (>24hrs) due to surgical blood loss or risk of bleeding: no

## 2018-10-22 NOTE — Anesthesia Postprocedure Evaluation (Signed)
Anesthesia Post Note  Patient: Sandra Hunter  Procedure(s) Performed: CONVERSION TO LEFT TOTAL ANTERIOR APPROACH HIP (Left Hip)     Patient location during evaluation: PACU Anesthesia Type: Spinal Level of consciousness: oriented, awake and alert and awake Pain management: pain level controlled Vital Signs Assessment: post-procedure vital signs reviewed and stable Respiratory status: spontaneous breathing, respiratory function stable, patient connected to nasal cannula oxygen and nonlabored ventilation Cardiovascular status: blood pressure returned to baseline and stable Postop Assessment: no headache, no backache, no apparent nausea or vomiting and spinal receding Anesthetic complications: no    Last Vitals:  Vitals:   10/22/18 1215 10/22/18 1230  BP: 117/64 (!) 119/55  Pulse: 67 64  Resp: 10 16  Temp:    SpO2: 100% 100%    Last Pain:  Vitals:   10/22/18 1230  TempSrc:   PainSc: Asleep                 Catalina Gravel

## 2018-10-22 NOTE — Evaluation (Signed)
Physical Therapy Evaluation Patient Details Name: Sandra Hunter MRN: [id number] DOB: [birth year] Today's Date: 10/22/2018   History of Present Illness  78 yo female s/p conversion of L hip ORIF to L DA-THA on 10/22/18. PMH includes ARF, fall leading to femoral neck fracture and L ORIF 03/2018, CKD, HTN.  Clinical Impression   Pt presents with L hip and thigh pain exacerbated with WB, difficulty performing mobility tasks, LLE weakness, and decreased tolerance for activity post-operatively. Pt to benefit from acute PT to address deficits. Pt stood EOB, but unable to progress to ambulation this session due to severe L hip and thigh pain. Pt educated on ankle pumps (20/hour) to perform this afternoon/evening to increase circulation, to pt's tolerance and limited by pain. PT to progress mobility as tolerated, and will continue to follow acutely.        Follow Up Recommendations Follow surgeon's recommendation for DC plan and follow-up therapies;Supervision for mobility/OOB    Equipment Recommendations  None recommended by PT    Recommendations for Other Services       Precautions / Restrictions Precautions Precautions: Fall Restrictions Weight Bearing Restrictions: Yes LLE Weight Bearing: Partial weight bearing LLE Partial Weight Bearing Percentage or Pounds: 50%      Mobility  Bed Mobility Overal bed mobility: Needs Assistance Bed Mobility: Supine to Sit;Sit to Supine     Supine to sit: Min assist;HOB elevated Sit to supine: Min assist;HOB elevated   General bed mobility comments: Min assist for LLE lifting and translation to and from EOB, pt able to use bedrails to come to sitting.  Transfers Overall transfer level: Needs assistance Equipment used: Rolling walker (2 wheeled) Transfers: Sit to/from Stand Sit to Stand: Min assist;From elevated surface         General transfer comment: Min assist for power up, VC for hand placement when rising. Upon WB, pt  complaining of sharp pain in femur. Pt became tearful and states "I can't do it" and sat down. Pt requesting return to supine.  Ambulation/Gait Ambulation/Gait assistance: (NT - pt in too much pain)              Stairs            Wheelchair Mobility    Modified Rankin (Stroke Patients Only)       Balance Overall balance assessment: Mild deficits observed, not formally tested;History of Falls                                           Pertinent Vitals/Pain Pain Assessment: 0-10 Pain Score: 3  Pain Location: L hip Pain Descriptors / Indicators: Aching;Sore Pain Intervention(s): Limited activity within patient's tolerance;Monitored during session;Premedicated before session;Repositioned    Home Living Family/patient expects to be discharged to:: Private residence Living Arrangements: Alone Available Help at Discharge: Family;Available 24 hours/day;Friend(s)(daughter to stay with pt, then another person will be staying with her) Type of Home: House Home Access: Stairs to enter Entrance Stairs-Rails: Right Entrance Stairs-Number of Steps: 3 Home Layout: One level Home Equipment: Walker - 2 wheels;Shower seat - built in;Cane - single point;Bedside commode      Prior Function Level of Independence: Independent with assistive device(s)         Comments: using cane PTA     Hand Dominance   Dominant Hand: Right    Extremity/Trunk Assessment   Upper Extremity Assessment Upper Extremity Assessment:  Overall WFL for tasks assessed    Lower Extremity Assessment Lower Extremity Assessment: Generalized weakness;LLE deficits/detail LLE Deficits / Details: suspected post-surgical weakness; able to perform ankle pumps, quad set, heel slide, SLR with lift assist LLE Sensation: WNL    Cervical / Trunk Assessment Cervical / Trunk Assessment: Normal  Communication   Communication: No difficulties  Cognition Arousal/Alertness:  Awake/alert Behavior During Therapy: WFL for tasks assessed/performed Overall Cognitive Status: Within Functional Limits for tasks assessed                                        General Comments      Exercises     Assessment/Plan    PT Assessment Patient needs continued PT services  PT Problem List Decreased strength;Decreased mobility;Decreased range of motion;Decreased activity tolerance;Decreased balance;Decreased knowledge of use of DME;Pain;Decreased knowledge of precautions       PT Treatment Interventions DME instruction;Therapeutic activities;Gait training;Therapeutic exercise;Patient/family education;Balance training;Stair training;Functional mobility training    PT Goals (Current goals can be found in the Care Plan section)  Acute Rehab PT Goals Patient Stated Goal: decrease pain PT Goal Formulation: With patient Time For Goal Achievement: 10/29/18 Potential to Achieve Goals: Good    Frequency 7X/week   Barriers to discharge        Co-evaluation               AM-PAC PT "6 Clicks" Mobility  Outcome Measure Help needed turning from your back to your side while in a flat bed without using bedrails?: A Little Help needed moving from lying on your back to sitting on the side of a flat bed without using bedrails?: A Little Help needed moving to and from a bed to a chair (including a wheelchair)?: A Little Help needed standing up from a chair using your arms (e.g., wheelchair or bedside chair)?: A Little Help needed to walk in hospital room?: A Lot Help needed climbing 3-5 steps with a railing? : A Lot 6 Click Score: 16    End of Session Equipment Utilized During Treatment: Gait belt Activity Tolerance: Patient limited by pain Patient left: in bed;with bed alarm set;with SCD's reapplied;with call bell/phone within reach Nurse Communication: Mobility status PT Visit Diagnosis: Other abnormalities of gait and mobility (R26.89);Difficulty in  walking, not elsewhere classified (R26.2)    Time: 6378-5885 PT Time Calculation (min) (ACUTE ONLY): 16 min   Charges:   PT Evaluation $PT Eval Low Complexity: 1 Low         Julien Girt, PT Acute Rehabilitation Services Pager [phone]  Office [phone]   Fiora Weill D Elonda Husky 10/22/2018, 7:03 PM

## 2018-10-22 NOTE — Interval H&P Note (Signed)
History and Physical Interval Note:  10/22/2018 7:13 AM  Sandra Hunter  has presented today for surgery, with the diagnosis of Failed left hip surgery.  The various methods of treatment have been discussed with the patient and family. After consideration of risks, benefits and other options for treatment, the patient has consented to  Procedure(s) with comments: CONVERSION TO TOTAL HIP-anterior approach (Left) - 120 mins as a surgical intervention.  The patient's history has been reviewed, patient examined, no change in status, stable for surgery.  I have reviewed the patient's chart and labs.  Questions were answered to the patient's satisfaction.     Mauri Pole

## 2018-10-22 NOTE — Op Note (Signed)
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  POSTOPERATIVE DIAGNOSIS:  Failed left hip surgery from prior intertrochanteric femur fracture with femoral head cutout of the screw.  PROCEDURE:  Conversion of failed left total hip replacement to total hip arthroplasty including removal of hardware and subsequent total hip arthroplasty.  COMPONENTS USED:  A size 52 mm Pinnacle shell, 36+4 neutral Ultrex liner, size 6 standard Actis stem with a 36+1.5 Articul/Eze metal ball.  SURGEON:  Paralee Cancel, MD  ASSISTANT:  Danae Orleans, PA-C.  Note that Mr. Guinevere Scarlet was present for the entirety of the case from preoperative positioning, perioperative management of the operative extremity, general facilitation of the case and primary wound closure.  ANESTHESIA:  Spinal.  ESTIMATED BLOOD LOSS:  500 mL.  COMPLICATIONS:  None.  INDICATIONS FOR PROCEDURE:  The patient is a pleasant 78 year old female with history of a left intertrochanteric femur fracture that was fixed in November 2019.  She was referred for evaluation when there was excessive collapse of the fracture site, and  that resulted in migration of her femoral lag screw to the subchondral bone and then eventually through it.  She had pain that was limiting her functional quality of life.  She wished to proceed with more definitive measures.  We discussed converting  her to a total hip arthroplasty, reviewed the risks of infection, DVT, dislocation, need for future surgeries.  We discussed the procedure and the postoperative course expected postoperatively.  She wished at this point to proceed with this.  Consent was  obtained.  PROCEDURE IN  DETAIL:  The patient was brought to the operative theater.  Once adequate anesthesia and preoperative antibiotics were administered, including Ancef 2 g as well as tranexamic acid and Decadron, she was carefully positioned supine on the Hana  table with all bony prominences padded.  Her prior surgical incisions were identified.  The plan was to remove this nail through her old incisions in the supine position and then proceed with total hip arthroplasty.  Once the left hip was prepped and  draped in sterile fashion, a timeout was performed identifying the patient, the planned procedure and extremity.  The attention was first directed toward nail removal.  I first made a distal-most incision for the distal interlock and identified the  screw.  With the assistance of fluoroscopy, I was able to remove the screw.  Next, I went proximal at the nail insertion site, and we extended this incision slightly for exposure and went through the gluteal fascia.  I found the tip of the trochanter.   Following debridement of this area, the nail was identified and a screwdriver was used to unlock the lag screw so the lag screw could be removed.  Once this was done, I went to the middle-based incision for the lag screw insertion and identified the lag  screw and removed it.  At this point, I decided to go ahead and open up the hip so I could extend and adduct the hip to allow for nail removal more easily.  I made an incision for primary hip replacement for an anterior approach beginning 2 cm lateral to  the anterior, superior iliac spine, exposing the tensor fascia lata fascia.  This fascia was then incised, and the muscle  was swept laterally and retractors were placed over the superior and inferior neck extraarticular.  Pericapsular fat was excised  and circumflex vessel was cauterized.  A capsulotomy was performed identifying the fusion.  No signs of infection.  Once the capsules were elevated, tag sutures were placed and  retractors were placed intraarticular.  She had collapse significantly  through the fracture site and the space at cervical region.  A neck osteotomy was made from the trochanteric fossa towards the area of the lesser trochanter.  The femoral head was removed.  Identification of the lag screw penetrating through the chondral  plate was identified.  Once the head was removed, traction was let off.  I was then able to extend and adduct the leg.  We then returned to removing the nail.  With this exposure, I was able to place the conical extraction tool into the base of the top  part of the nail, and the nail was removed without further consequence.  I packed these wounds off as we attended to the total hip arthroplasty procedure.  From a hip replacement standpoint, attention was first directed to the acetabulum.  Retractors were placed anterior, posterior and inferior.  Labral tissue and soft tissue within the fovea were debrided.  I began reaming with a 47 reamer, reamed up to 51  mm.  Under fluoroscopic imaging, a final 52 mm cup had been selected and was impacted.  It appeared to be in an anatomic position, both on inspection intraoperatively as well as fluoroscopically with regard to abduction, forward flexion.  I placed 2  cancellous screws due to the osteoporotic nature of the acetabulum.  The final 36+4 neutral AltrX liner was then impacted with good visualized rim fit.  Attention was now directed to the femur.  A lateral hook was placed underneath the vastus lateralis,  and the femur was rolled to 100 degrees.  The proximal femur was exposed by elevating the posterior capsule off the medial trochanter.  I then used a box osteotome and set orientation, and we carefully began broaching with a starting broach and then the  0 broach to make certain that there was no penetration from her screw hole or otherwise.  I broached up to a 3 initially and then did a radiographic assessment to make certain that we  were happy with the orientation of the component, and we were.   Following this, I repositioned the hip and further broached up to a size 6, which we had a very tight fit.  I did a trial reduction with a 6 standard neck and a 36+1.5 ball.  With this, the stem on placement of the broaches felt very tight.  There  appeared to be some room, but I did not feel I could upsize without causing complication.  The leg lengths appeared to be comparable now and restoring her leg lengths back to normal based on the lesser trochanter evaluation through the ischium.  Given  all these findings, I removed the trial components.  The final 6 standard Actis stem was opened.  It was then impacted.  I did repeat a trial and did feel best with a +1.5 ball in terms of restoring length and providing offset.  The final 36+1.5  Articul/Eze metal ball was impacted on a clean and dried trunnion and the hip was reduced.  We had irrigated all the wounds throughout the case and again at this point.  The capsule was reapproximated using #1 Vicryl anteriorly.  The total  hip incision  was reapproximated in layers using #1 Vicryl on the fascia as well as Stratafix.  This wound was then closed with 2-0 Vicryl and a running Monocryl.  The extraction wounds were closed in layers as well and closed with Dermabond and Aquacel dressings.   The patient was then brought to the recovery room in stable condition, tolerating the procedure well.  I attempted to call her family and left a message with her son on the computer.  She will be partial weightbearing based on the nature of her bone  quality to allow for adequate healing and to try to reduce complications.  I will see her back in the office for routine followup following her discharge from the hospital.  LN/NUANCE  D:10/22/2018 T:10/22/2018 JOB:[id number]

## 2018-10-22 NOTE — Transfer of Care (Signed)
Immediate Anesthesia Transfer of Care Note  Patient: Sandra Hunter  Procedure(s) Performed: CONVERSION TO LEFT TOTAL ANTERIOR APPROACH HIP (Left Hip)  Patient Location: PACU  Anesthesia Type:Spinal  Level of Consciousness: sedated  Airway & Oxygen Therapy: Patient Spontanous Breathing and Patient connected to face mask oxygen  Post-op Assessment: Report given to RN and Post -op Vital signs reviewed and stable  Post vital signs: Reviewed and stable  Last Vitals:  Vitals Value Taken Time  BP    Temp    Pulse    Resp    SpO2      Last Pain:  Vitals:   10/22/18 0627  TempSrc: Oral         Complications: No apparent anesthesia complications

## 2018-10-23 ENCOUNTER — Encounter (HOSPITAL_COMMUNITY): Payer: Self-pay | Admitting: Orthopedic Surgery

## 2018-10-23 LAB — BASIC METABOLIC PANEL WITH GFR
Anion gap: 10 (ref 5–15)
BUN: 61 mg/dL — ABNORMAL HIGH (ref 8–23)
CO2: 20 mmol/L — ABNORMAL LOW (ref 22–32)
Calcium: 8 mg/dL — ABNORMAL LOW (ref 8.9–10.3)
Chloride: 109 mmol/L (ref 98–111)
Creatinine, Ser: 2.07 mg/dL — ABNORMAL HIGH (ref 0.44–1.00)
GFR calc Af Amer: 26 mL/min — ABNORMAL LOW
GFR calc non Af Amer: 23 mL/min — ABNORMAL LOW
Glucose, Bld: 176 mg/dL — ABNORMAL HIGH (ref 70–99)
Potassium: 4.9 mmol/L (ref 3.5–5.1)
Sodium: 139 mmol/L (ref 135–145)

## 2018-10-23 LAB — CBC
HCT: 23.8 % — ABNORMAL LOW (ref 36.0–46.0)
Hemoglobin: 7.4 g/dL — ABNORMAL LOW (ref 12.0–15.0)
MCH: 32.3 pg (ref 26.0–34.0)
MCHC: 31.1 g/dL (ref 30.0–36.0)
MCV: 103.9 fL — ABNORMAL HIGH (ref 80.0–100.0)
Platelets: 234 K/uL (ref 150–400)
RBC: 2.29 MIL/uL — ABNORMAL LOW (ref 3.87–5.11)
RDW: 14.2 % (ref 11.5–15.5)
WBC: 10.6 K/uL — ABNORMAL HIGH (ref 4.0–10.5)
nRBC: 0 % (ref 0.0–0.2)

## 2018-10-23 MED ORDER — TRANEXAMIC ACID 650 MG PO TABS (ORTHO)
1950.0000 mg | ORAL_TABLET | Freq: Once | ORAL | Status: AC
  Administered 2018-10-23: 1950 mg via ORAL
  Filled 2018-10-23: qty 3

## 2018-10-23 NOTE — Progress Notes (Signed)
     Subjective: 1 Day Post-Op Procedure(s) (LRB): CONVERSION TO LEFT TOTAL ANTERIOR APPROACH HIP (Left)   Patient reports pain as moderate, controlled with medication.  States that she had a rough day yesterday and wasn't able to work with PT much, due to pain.  Plan for discharge tomorrow due to underlying medical co-morbidities, pain control and need for inpatient therapy to meet goal of being discharged home safely with family/caregiver.   Objective:   VITALS:   Vitals:   10/23/18 0130 10/23/18 0527  BP: 127/67 129/64  Pulse: 81 86  Resp: 18 16  Temp: 98.1 F (36.7 C) 98.9 F (37.2 C)  SpO2: 95% 96%    Dorsiflexion/Plantar flexion intact Incision: dressing C/D/I No cellulitis present Compartment soft  LABS Recent Labs    10/23/18 0443  HGB 7.4*  HCT 23.8*  WBC 10.6*  PLT 234    Recent Labs    10/23/18 0443  NA 139  K 4.9  BUN 61*  CREATININE 2.07*  GLUCOSE 176*     Assessment/Plan: 1 Day Post-Op Procedure(s) (LRB): CONVERSION TO LEFT TOTAL ANTERIOR APPROACH HIP (Left) HGB at 7.4 and will give a dose of Tranexamic acid Foley cath d/c'ed Advance diet Up with therapy D/C IV fluids Discharge home when ready, probably tomorrow      West Pugh. Cameran Ahmed   PAC  10/23/2018, 8:47 AM

## 2018-10-23 NOTE — Progress Notes (Signed)
Physical Therapy Treatment Patient Details Name: Sandra Hunter MRN: [id number] DOB: [birth year] Today's Date: 10/23/2018    History of Present Illness 78 yo female s/p conversion of L hip ORIF to L DA-THA on 10/22/18. PMH includes ARF, fall leading to femoral neck fracture and L ORIF 03/2018, CKD, HTN.    PT Comments    Pt is progressing well with mobility, she ambulated 160' with RW, no loss of balance. Reviewed THA HEP. Will plan to do stair training tomorrow morning, then expect pt will be ready to DC home.   Follow Up Recommendations  Follow surgeon's recommendation for DC plan and follow-up therapies;Supervision for mobility/OOB     Equipment Recommendations  None recommended by PT    Recommendations for Other Services       Precautions / Restrictions Precautions Precautions: Fall Restrictions Weight Bearing Restrictions: Yes LLE Weight Bearing: Partial weight bearing LLE Partial Weight Bearing Percentage or Pounds: 50%    Mobility  Bed Mobility Overal bed mobility: Needs Assistance Bed Mobility: Sit to Supine     Supine to sit: HOB elevated;Supervision Sit to supine: Min assist   General bed mobility comments: min A LLE into bed  Transfers Overall transfer level: Needs assistance Equipment used: Rolling walker (2 wheeled) Transfers: Sit to/from Stand Sit to Stand: From elevated surface;Min guard         General transfer comment: VCs hand placement and reminder for 50% PWB LLE  Ambulation/Gait Ambulation/Gait assistance: Min guard(NT - pt in too much pain) Gait Distance (Feet): 160 Feet Assistive device: Rolling walker (2 wheeled) Gait Pattern/deviations: Step-to pattern;Decreased weight shift to left;Decreased stride length     General Gait Details: VCs sequencing, no loss of balance   Stairs             Wheelchair Mobility    Modified Rankin (Stroke Patients Only)       Balance Overall balance assessment: Modified  Independent                                          Cognition Arousal/Alertness: Awake/alert Behavior During Therapy: WFL for tasks assessed/performed Overall Cognitive Status: Within Functional Limits for tasks assessed                                        Exercises Total Joint Exercises Ankle Circles/Pumps: AROM;Both;10 reps;Supine Quad Sets: AROM;Both;5 reps;Supine Short Arc Quad: AROM;10 reps;Left;Supine Heel Slides: AAROM;Left;10 reps;Supine Hip ABduction/ADduction: AAROM;Left;10 reps;Supine    General Comments        Pertinent Vitals/Pain Pain Score: 4  Pain Location: L hip Pain Descriptors / Indicators: Sore Pain Intervention(s): Monitored during session;Premedicated before session;Limited activity within patient's tolerance(pt declined ice)    Home Living                      Prior Function            PT Goals (current goals can now be found in the care plan section) Acute Rehab PT Goals Patient Stated Goal: to go for long walks PT Goal Formulation: With patient Time For Goal Achievement: 10/30/18 Potential to Achieve Goals: Good Progress towards PT goals: Progressing toward goals    Frequency    7X/week      PT Plan Current plan remains appropriate  Co-evaluation              AM-PAC PT "6 Clicks" Mobility   Outcome Measure  Help needed turning from your back to your side while in a flat bed without using bedrails?: None Help needed moving from lying on your back to sitting on the side of a flat bed without using bedrails?: A Little Help needed moving to and from a bed to a chair (including a wheelchair)?: A Little Help needed standing up from a chair using your arms (e.g., wheelchair or bedside chair)?: A Little Help needed to walk in hospital room?: A Little Help needed climbing 3-5 steps with a railing? : A Little 6 Click Score: 19    End of Session Equipment Utilized During Treatment:  Gait belt Activity Tolerance: Patient tolerated treatment well Patient left: with call bell/phone within reach;in bed;with nursing/sitter in room Nurse Communication: Mobility status PT Visit Diagnosis: Other abnormalities of gait and mobility (R26.89);Difficulty in walking, not elsewhere classified (R26.2);Pain Pain - Right/Left: Left Pain - part of body: Hip     Time: 5784-6962 PT Time Calculation (min) (ACUTE ONLY): 24 min  Charges:  $Gait Training: 8-22 mins $Therapeutic Exercise: 8-22 mins                     Blondell Reveal Kistler PT 10/23/2018  Acute Rehabilitation Services Pager [phone] Office [phone]

## 2018-10-23 NOTE — Plan of Care (Signed)
Continue POC

## 2018-10-23 NOTE — Progress Notes (Signed)
Physical Therapy Treatment Patient Details Name: Sandra Hunter MRN: [id number] DOB: February 14, 1941 Today's Date: 10/23/2018    History of Present Illness 78 yo female s/p conversion of L hip ORIF to L DA-THA on 10/22/18. PMH includes ARF, fall leading to femoral neck fracture and L ORIF 03/2018, CKD, HTN.    PT Comments    Pt ambulated 86' with RW, no loss of balance. Good adherence to 50% PWB status. Initiated THA HEP. Pt puts forth good effort. Good progress expected.   Follow Up Recommendations  Follow surgeon's recommendation for DC plan and follow-up therapies;Supervision for mobility/OOB     Equipment Recommendations  None recommended by PT    Recommendations for Other Services       Precautions / Restrictions Precautions Precautions: Fall Restrictions Weight Bearing Restrictions: Yes LLE Weight Bearing: Partial weight bearing LLE Partial Weight Bearing Percentage or Pounds: 50%    Mobility  Bed Mobility Overal bed mobility: Needs Assistance Bed Mobility: Supine to Sit     Supine to sit: HOB elevated;Supervision     General bed mobility comments: no assist needed  Transfers Overall transfer level: Needs assistance Equipment used: Rolling walker (2 wheeled) Transfers: Sit to/from Stand Sit to Stand: From elevated surface;Min guard         General transfer comment: VCs hand placement and reminder for 50% PWB LLE  Ambulation/Gait Ambulation/Gait assistance: Min guard(NT - pt in too much pain) Gait Distance (Feet): 70 Feet Assistive device: Rolling walker (2 wheeled) Gait Pattern/deviations: Step-to pattern;Decreased weight shift to left;Decreased stride length     General Gait Details: VCs sequencing, no loss of balance   Stairs             Wheelchair Mobility    Modified Rankin (Stroke Patients Only)       Balance Overall balance assessment: Modified Independent                                           Cognition Arousal/Alertness: Awake/alert Behavior During Therapy: WFL for tasks assessed/performed Overall Cognitive Status: Within Functional Limits for tasks assessed                                        Exercises Total Joint Exercises Ankle Circles/Pumps: AROM;Both;10 reps;Supine Heel Slides: AAROM;Left;10 reps;Supine Hip ABduction/ADduction: AAROM;Left;10 reps;Supine    General Comments        Pertinent Vitals/Pain Pain Score: 5  Pain Location: L hip Pain Descriptors / Indicators: Sore Pain Intervention(s): Limited activity within patient's tolerance;Monitored during session;Premedicated before session;Ice applied    Home Living                      Prior Function            PT Goals (current goals can now be found in the care plan section) Acute Rehab PT Goals Patient Stated Goal: to go for long walks PT Goal Formulation: With patient Time For Goal Achievement: 10/30/18 Potential to Achieve Goals: Good Progress towards PT goals: Progressing toward goals    Frequency    7X/week      PT Plan Current plan remains appropriate    Co-evaluation              AM-PAC PT "6 Clicks" Mobility  Outcome Measure  Help needed turning from your back to your side while in a flat bed without using bedrails?: None Help needed moving from lying on your back to sitting on the side of a flat bed without using bedrails?: A Little Help needed moving to and from a bed to a chair (including a wheelchair)?: A Little Help needed standing up from a chair using your arms (e.g., wheelchair or bedside chair)?: A Little Help needed to walk in hospital room?: A Little Help needed climbing 3-5 steps with a railing? : A Little 6 Click Score: 19    End of Session Equipment Utilized During Treatment: Gait belt Activity Tolerance: Patient tolerated treatment well Patient left: with call bell/phone within reach;in chair;with chair alarm set Nurse  Communication: Mobility status PT Visit Diagnosis: Other abnormalities of gait and mobility (R26.89);Difficulty in walking, not elsewhere classified (R26.2)     Time: 3143-8887 PT Time Calculation (min) (ACUTE ONLY): 14 min  Charges:  $Gait Training: 8-22 mins                     Blondell Reveal Kistler PT 10/23/2018  Acute Rehabilitation Services Pager [phone] Office [phone]

## 2018-10-24 LAB — CBC
HCT: 22.2 % — ABNORMAL LOW (ref 36.0–46.0)
Hemoglobin: 6.9 g/dL — CL (ref 12.0–15.0)
MCH: 31.9 pg (ref 26.0–34.0)
MCHC: 31.1 g/dL (ref 30.0–36.0)
MCV: 102.8 fL — ABNORMAL HIGH (ref 80.0–100.0)
Platelets: 232 10*3/uL (ref 150–400)
RBC: 2.16 MIL/uL — ABNORMAL LOW (ref 3.87–5.11)
RDW: 14.6 % (ref 11.5–15.5)
WBC: 10.8 10*3/uL — ABNORMAL HIGH (ref 4.0–10.5)
nRBC: 0 % (ref 0.0–0.2)

## 2018-10-24 LAB — BASIC METABOLIC PANEL
Anion gap: 9 (ref 5–15)
BUN: 69 mg/dL — ABNORMAL HIGH (ref 8–23)
CO2: 20 mmol/L — ABNORMAL LOW (ref 22–32)
Calcium: 8.2 mg/dL — ABNORMAL LOW (ref 8.9–10.3)
Chloride: 110 mmol/L (ref 98–111)
Creatinine, Ser: 2.38 mg/dL — ABNORMAL HIGH (ref 0.44–1.00)
GFR calc Af Amer: 22 mL/min — ABNORMAL LOW (ref >60)
GFR calc non Af Amer: 19 mL/min — ABNORMAL LOW (ref >60)
Glucose, Bld: 153 mg/dL — ABNORMAL HIGH (ref 70–99)
Potassium: 5 mmol/L (ref 3.5–5.1)
Sodium: 139 mmol/L (ref 135–145)

## 2018-10-24 LAB — PREPARE RBC (CROSSMATCH)

## 2018-10-24 MED ORDER — HYDROCODONE-ACETAMINOPHEN 5-325 MG PO TABS: 1.0000 | ORAL_TABLET | ORAL | 0 refills | Status: AC | PRN

## 2018-10-24 MED ORDER — SODIUM CHLORIDE 0.9% IV SOLUTION
Freq: Once | INTRAVENOUS | Status: AC
  Administered 2018-10-24: 10:00:00 via INTRAVENOUS

## 2018-10-24 MED ORDER — POLYETHYLENE GLYCOL 3350 17 G PO PACK: 17.0000 g | PACK | Freq: Two times a day (BID) | ORAL | 0 refills | Status: DC

## 2018-10-24 MED ORDER — DOCUSATE SODIUM 100 MG PO CAPS: 100.0000 mg | ORAL_CAPSULE | Freq: Two times a day (BID) | ORAL | 0 refills | Status: DC

## 2018-10-24 MED ORDER — METHOCARBAMOL 500 MG PO TABS: 500.0000 mg | ORAL_TABLET | Freq: Four times a day (QID) | ORAL | 0 refills | Status: DC | PRN

## 2018-10-24 MED ORDER — FERROUS SULFATE 325 (65 FE) MG PO TABS: 325.0000 mg | ORAL_TABLET | Freq: Three times a day (TID) | ORAL | 3 refills | Status: DC

## 2018-10-24 MED ORDER — ASPIRIN 81 MG PO CHEW: 81.0000 mg | CHEWABLE_TABLET | Freq: Two times a day (BID) | ORAL | 0 refills | Status: AC

## 2018-10-24 NOTE — Plan of Care (Signed)
Patient discharged in stable condition. She is waiting on her ride

## 2018-10-24 NOTE — TOC Initial Note (Signed)
Transition of Care [facility]) - Initial/Assessment Note    Patient Details  Name: Sandra Hunter MRN: [id number] Date of Birth: 01/21/1941  [facility] CM/SW Contact:    Joaquin Courts, RN Phone Number: 10/24/2018, 12:12 PM  Clinical Narrative:   CM spoke with patient at bedside. Patient reports her daughter, Olivia Mackie, will stay with her after discharge. Patient to return home with home exercise program and has rolling walker and 3-in-1 at home.                  Expected Discharge Plan: Home/Self Care Barriers to Discharge: No Barriers Identified   Patient Goals and CMS Choice Patient states their goals for this hospitalization and ongoing recovery are:: to go home      Expected Discharge Plan and Services Expected Discharge Plan: Home/Self Care   Discharge Planning Services: CM Consult   Living arrangements for the past 2 months: Single Family Home Expected Discharge Date: 10/24/18               DME Arranged: N/A DME Agency: NA       HH Arranged: NA [place] Agency: NA        Prior Living Arrangements/Services Living arrangements for the past 2 months: Single Family Home Lives with:: Self Patient language and need for interpreter reviewed:: Yes Do you feel safe going back to the place where you live?: Yes      Need for Family Participation in Patient Care: Yes (Comment) Care giver support system in place?: Yes (comment)   Criminal Activity/Legal Involvement Pertinent to Current Situation/Hospitalization: No - Comment as needed  Activities of Daily Living Home Assistive Devices/Equipment: Cane (specify quad or straight), Walker (specify type), Bedside commode/3-in-1 ADL Screening (condition at time of admission) Patient's cognitive ability adequate to safely complete daily activities?: Yes Is the patient deaf or have difficulty hearing?: No Does the patient have difficulty seeing, even when wearing glasses/contacts?: No Does the patient have  difficulty concentrating, remembering, or making decisions?: No Patient able to express need for assistance with ADLs?: Yes Does the patient have difficulty dressing or bathing?: No Independently performs ADLs?: Yes (appropriate for developmental age) Does the patient have difficulty walking or climbing stairs?: Yes Weakness of Legs: Left Weakness of Arms/Hands: None  Permission Sought/Granted                  Emotional Assessment Appearance:: Appears stated age Attitude/Demeanor/Rapport: Engaged Affect (typically observed): Accepting Orientation: : Oriented to Self, Oriented to Place, Oriented to  Time, Oriented to Situation   Psych Involvement: No (comment)  Admission diagnosis:  Failed left hip surgery Patient Active Problem List   Diagnosis Date Noted  . S/P left TH conversion 10/22/2018  . Constipation 04/14/2018  . ARF (acute renal failure) ([place])   . Closed fracture of left hip ([place])   . Malnutrition of moderate degree 04/07/2018  . Femoral neck fracture ([place]) 04/06/2018  . CKD (chronic kidney disease) stage 4, GFR 15-29 ml/min (HCC) 04/06/2018  . CELLULITIS AND ABSCESS OF UPPER ARM AND FOREARM 12/04/2009  . Hypothyroidism 03/22/2009  . MITRAL REGURGITATION 03/22/2009  . Essential hypertension 03/22/2009  . 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 UNDIAGNOSED CARDIAC MURMURS 03/22/2009   PCP:  [name], MD Pharmacy:   [place], [place] - [place] [address] [place] [address] [place] [address] Phone: [phone] Fax: [phone]     Social Determinants of Health (SDOH) Interventions    Readmission Risk Interventions  No flowsheet data found.

## 2018-10-24 NOTE — Progress Notes (Signed)
Physical Therapy Treatment Patient Details Name: Sandra Hunter MRN: [id number] DOB: Mar 03, 1941 Today's Date: 10/24/2018    History of Present Illness 78 yo female s/p conversion of L hip ORIF to L DA-THA on 10/22/18. PMH includes ARF, fall leading to femoral neck fracture and L ORIF 03/2018, CKD, HTN.    PT Comments    Noted Hgb is 6.9 and transfusion is pending. Reviewed THA HEP and completed stair training. Pt unable to tolerate ambulating more than 16' 2* onset of dizziness, suspect this is due to anemia.   Follow Up Recommendations  Follow surgeon's recommendation for DC plan and follow-up therapies;Supervision for mobility/OOB     Equipment Recommendations  None recommended by PT    Recommendations for Other Services       Precautions / Restrictions Precautions Precautions: Fall Restrictions Weight Bearing Restrictions: Yes LLE Weight Bearing: Partial weight bearing LLE Partial Weight Bearing Percentage or Pounds: 50%    Mobility  Bed Mobility   Bed Mobility: Supine to Sit     Supine to sit: HOB elevated;Modified independent (Device/Increase time)        Transfers Overall transfer level: Needs assistance Equipment used: Rolling walker (2 wheeled) Transfers: Sit to/from Stand Sit to Stand: From elevated surface;Supervision         General transfer comment: VCs hand placement and reminder for 50% PWB LLE  Ambulation/Gait Ambulation/Gait assistance: Supervision(NT - pt in too much pain) Gait Distance (Feet): 18 Feet Assistive device: Rolling walker (2 wheeled) Gait Pattern/deviations: Step-to pattern;Decreased weight shift to left;Decreased stride length Gait velocity: decr   General Gait Details: distance limited by dizziness (Hgb 6.9, transfusion pending)   Stairs Stairs: Yes Stairs assistance: Min guard Stair Management: Two rails;Step to pattern;Forwards Number of Stairs: 3 General stair comments: pt reports she has B rails and can reach  both, VCs for sequencing   Wheelchair Mobility    Modified Rankin (Stroke Patients Only)       Balance Overall balance assessment: Modified Independent                                          Cognition Arousal/Alertness: Awake/alert Behavior During Therapy: WFL for tasks assessed/performed Overall Cognitive Status: Within Functional Limits for tasks assessed                                        Exercises Total Joint Exercises Ankle Circles/Pumps: AROM;Both;10 reps;Supine Quad Sets: AROM;Both;5 reps;Supine Short Arc Quad: AROM;10 reps;Left;Supine Heel Slides: AAROM;Left;10 reps;Supine Hip ABduction/ADduction: AAROM;Left;10 reps;Supine    General Comments        Pertinent Vitals/Pain Pain Score: 4  Pain Location: L hip Pain Descriptors / Indicators: Sore Pain Intervention(s): Limited activity within patient's tolerance;Monitored during session;Premedicated before session;Ice applied    Home Living                      Prior Function            PT Goals (current goals can now be found in the care plan section) Acute Rehab PT Goals Patient Stated Goal: to go for long walks PT Goal Formulation: With patient Time For Goal Achievement: 10/30/18 Potential to Achieve Goals: Good Progress towards PT goals: Progressing toward goals    Frequency    7X/week  PT Plan Current plan remains appropriate    Co-evaluation              AM-PAC PT "6 Clicks" Mobility   Outcome Measure  Help needed turning from your back to your side while in a flat bed without using bedrails?: None Help needed moving from lying on your back to sitting on the side of a flat bed without using bedrails?: A Little Help needed moving to and from a bed to a chair (including a wheelchair)?: A Little Help needed standing up from a chair using your arms (e.g., wheelchair or bedside chair)?: A Little Help needed to walk in hospital room?:  A Little Help needed climbing 3-5 steps with a railing? : A Little 6 Click Score: 19    End of Session Equipment Utilized During Treatment: Gait belt Activity Tolerance: Treatment limited secondary to medical complications (Comment)(dizziness) Patient left: with call bell/phone within reach;in chair;with chair alarm set Nurse Communication: Mobility status PT Visit Diagnosis: Other abnormalities of gait and mobility (R26.89);Difficulty in walking, not elsewhere classified (R26.2);Pain Pain - Right/Left: Left Pain - part of body: Hip     Time: 9628-3662 PT Time Calculation (min) (ACUTE ONLY): 20 min  Charges:  $Gait Training: 8-22 mins                     Blondell Reveal Kistler PT 10/24/2018  Acute Rehabilitation Services Pager [phone] Office [phone]

## 2018-10-24 NOTE — Progress Notes (Signed)
Date and time results received: 10/24/18 0350 (use smartphrase ".now" to insert current time)  Test: Hgb Critical Value: 6.9  Name of Provider Notified: Jenetta Loges PA-C  Orders Received? Or Actions Taken?: will review during AM rounds

## 2018-10-24 NOTE — Progress Notes (Signed)
Sandra Hunter  MRN: [id number] DOB/Age: [birth year] 78 y.o. [facility] Procedure: Procedure(s) (LRB): CONVERSION TO LEFT TOTAL ANTERIOR APPROACH HIP (Left)     Subjective: Does report feeling dizzy when up with therapy yesterday but really wants to go home today. Got up to bathroom through night with nursing  Vital Signs Temp:  [97.6 F (36.4 C)-98.2 F (36.8 C)] 98.1 F (36.7 C) (06/13 0609) Pulse Rate:  [76-83] 83 (06/13 0609) Resp:  [14-16] 14 (06/13 0609) BP: (127-138)/(63-75) 138/70 (06/13 0609) SpO2:  [93 %-98 %] 93 % (06/13 0609)  Lab Results Recent Labs    10/23/18 0443 10/24/18 0254  WBC 10.6* 10.8*  HGB 7.4* 6.9*  HCT 23.8* 22.2*  PLT 234 232   BMET Recent Labs    10/23/18 0443 10/24/18 0254  NA 139 139  K 4.9 5.0  CL 109 110  CO2 20* 20*  GLUCOSE 176* 153*  BUN 61* 69*  CREATININE 2.07* 2.38*  CALCIUM 8.0* 8.2*   INR  Date Value Ref Range Status  04/07/2018 1.08  Final    Comment:    Performed at [facility], [place] [address], [place], [place] [address]     Exam Already dressed for discharge. [place] home After transfuse 1 unit  [facility] PA-C  10/24/2018, 9:46 AM Contact # [phone]

## 2018-10-26 LAB — TYPE AND SCREEN
ABO/RH(D): A POS
Antibody Screen: POSITIVE
Donor AG Type: NEGATIVE
Unit division: 0

## 2018-10-26 LAB — BPAM RBC
Blood Product Expiration Date: 202006182359
ISSUE DATE / TIME: 202006131055
Unit Type and Rh: 6200

## 2018-10-27 NOTE — Discharge Summary (Signed)
Physician Discharge Summary  Patient ID: AHTZIRI JEFFRIES MRN: [id number] DOB/AGE: [birth year] 78 y.o.  Admit date: 10/22/2018 Discharge date: 10/24/2018   Procedures:  Procedure(s) (LRB): CONVERSION TO LEFT TOTAL ANTERIOR APPROACH HIP (Left)  Attending Physician:  Dr. Paralee Cancel   Admission Diagnoses:   Left hip pain s/p ORIF  Discharge Diagnoses:  Principal Problem:   S/P left TH conversion  Past Medical History:  Diagnosis Date   Arthritis    fingers   CKD (chronic kidney disease)    CKD (chronic kidney disease) stage 4, GFR 15-29 ml/min ([place]) 04/06/2018   Hypertension    [name]   Hypothyroidism    Mitral regurgitation     HPI:    Sandra Hunter, 78 y.o. female, has a history of pain and functional disability in the left hip due to trauma and arthritis and patient has failed non-surgical conservative treatments for greater than 12 weeks to include NSAID's and/or analgesics, use of assistive devices and activity modification. The indications for the revision total hip arthroplasty are failure of previous ORIF. Onset of symptoms was abrupt starting < 1 year ago with rapidlly worsening course since that time.  Prior procedures on the left hip include ORIF. Patient currently rates pain in the left hip at 6 out of 10 with activity.  There is worsening of pain with activity and weight bearing, trendelenberg gait, pain that interfers with activities of daily living and pain with passive range of motion. Patient has evidence of previous ORIF hardware by imaging studies.  This condition presents safety issues increasing the risk of falls.   There is no current active infection.  Risks, benefits and expectations were discussed with the patient.  Risks including but not limited to the risk of anesthesia, blood clots, nerve damage, blood vessel damage, failure of the prosthesis, infection and up to and including death.  Patient understand the risks, benefits and  expectations and wishes to proceed with surgery.   PCP: [name], MD   Discharged Condition: good  Hospital Course:  Patient underwent the above stated procedure on 10/22/2018. Patient tolerated the procedure well and brought to the recovery room in good condition and subsequently to the floor.  POD #1 BP: 129/64 ; Pulse: 86 ; Temp: 98.9 F (37.2 C) ; Resp: 16 Patient reports pain as moderate, controlled with medication.  States that she had a rough day yesterday and wasn't able to work with PT much, due to pain.  Plan for discharge tomorrowdue to underlying medical co-morbidities, pain control and need for inpatient therapy to meet goal of being discharged home safely with family/caregiver. Dorsiflexion/plantar flexion intact, incision: dressing C/D/I, no cellulitis present and compartment soft.   LABS  Basename    HGB     7.4  HCT     23.8   POD #2  BP: 138/70 ; Pulse: 83 ; Temp: 98.1 F (36.7 C) ; Resp: 14 Does report feeling dizzy when up with therapy yesterday but really wants to go home today. Got up to bathroom through night with nursing.  Received blood.  Ready to be discharged after the blood with symptoms resolved.  Already dressed for discharge. NVI.  LABS  Basename    HGB     6.9  HCT     22.2    Discharge Exam: General appearance: alert, cooperative and no distress Extremities: Homans sign is negative, no sign of DVT, no edema, redness or tenderness in the calves or thighs and no ulcers,  gangrene or trophic changes  Disposition:  Home with follow up in 2 weeks   Follow-up Information    Paralee Cancel, MD. Schedule an appointment as soon as possible for a visit in 2 weeks.   Specialty: Orthopedic Surgery Contact information: [address] [place] [address] [phone]           Discharge Instructions    Call MD / Call 911   Complete by: As directed    If you experience chest pain or shortness of breath, CALL 911 and be transported  to the hospital emergency room.  If you develope a fever above 101 F, pus (white drainage) or increased drainage or redness at the wound, or calf pain, call your surgeon's office.   Change dressing   Complete by: As directed    Maintain surgical dressing until follow up in the clinic. If the edges start to pull up, may reinforce with tape. If the dressing is no longer working, may remove and cover with gauze and tape, but must keep the area dry and clean.  Call with any questions or concerns.   Constipation Prevention   Complete by: As directed    Drink plenty of fluids.  Prune juice may be helpful.  You may use a stool softener, such as Colace (over the counter) 100 mg twice a day.  Use MiraLax (over the counter) for constipation as needed.   Diet - low sodium heart healthy   Complete by: As directed    Discharge instructions   Complete by: As directed    Maintain surgical dressing until follow up in the clinic. If the edges start to pull up, may reinforce with tape. If the dressing is no longer working, may remove and cover with gauze and tape, but must keep the area dry and clean.  Follow up in 2 weeks at [facility]. Call with any questions or concerns.   Increase activity slowly as tolerated   Complete by: As directed    Partial weight bearing with assist device as directed.  50 % on left leg   TED hose   Complete by: As directed    Use stockings (TED hose) for 2 weeks on both leg(s).  You may remove them at night for sleeping.      Allergies as of 10/24/2018   No Known Allergies     Medication List    TAKE these medications   amLODipine 10 MG tablet Commonly known as: NORVASC Take 10 mg by mouth daily.   aspirin 81 MG chewable tablet Commonly known as: Aspirin Childrens Chew 1 tablet (81 mg total) by mouth 2 (two) times daily for 30 days. Take for 4 weeks, then resume regular dose.   calcitRIOL 0.25 MCG capsule Commonly known as: ROCALTROL Take 0.25 mcg by mouth  daily.   docusate sodium 100 MG capsule Commonly known as: Colace Take 1 capsule (100 mg total) by mouth 2 (two) times daily.   ferrous sulfate 325 (65 FE) MG tablet Commonly known as: FerrouSul Take 1 tablet (325 mg total) by mouth 3 (three) times daily with meals.   furosemide 20 MG tablet Commonly known as: LASIX Take 20 mg by mouth daily.   HYDROcodone-acetaminophen 5-325 MG tablet Commonly known as: NORCO/VICODIN Take 1-2 tablets by mouth every 4 (four) hours as needed for moderate pain.   levothyroxine 50 MCG tablet Commonly known as: SYNTHROID Take 50 mcg by mouth daily before breakfast.   methocarbamol 500 MG tablet Commonly  known as: Robaxin Take 1 tablet (500 mg total) by mouth every 6 (six) hours as needed for muscle spasms.   polyethylene glycol 17 g packet Commonly known as: MIRALAX / GLYCOLAX Take 17 g by mouth 2 (two) times daily.            Discharge Care Instructions  (From admission, onward)         Start     Ordered   10/24/18 0000  Change dressing    Comments: Maintain surgical dressing until follow up in the clinic. If the edges start to pull up, may reinforce with tape. If the dressing is no longer working, may remove and cover with gauze and tape, but must keep the area dry and clean.  Call with any questions or concerns.   10/24/18 9643           Signed: [place]. Sherrilyn Nairn   PA-C  10/27/2018, 8:20 AM

## 2018-12-03 DIAGNOSIS — Z96642 Presence of left artificial hip joint: Secondary | ICD-10-CM | POA: Diagnosis not present

## 2018-12-03 DIAGNOSIS — Z471 Aftercare following joint replacement surgery: Secondary | ICD-10-CM | POA: Diagnosis not present

## 2019-01-20 DIAGNOSIS — N184 Chronic kidney disease, stage 4 (severe): Secondary | ICD-10-CM | POA: Diagnosis not present

## 2019-01-20 DIAGNOSIS — N189 Chronic kidney disease, unspecified: Secondary | ICD-10-CM | POA: Diagnosis not present

## 2019-01-20 DIAGNOSIS — N2581 Secondary hyperparathyroidism of renal origin: Secondary | ICD-10-CM | POA: Diagnosis not present

## 2019-01-20 DIAGNOSIS — M1612 Unilateral primary osteoarthritis, left hip: Secondary | ICD-10-CM | POA: Diagnosis not present

## 2019-01-20 DIAGNOSIS — I129 Hypertensive chronic kidney disease with stage 1 through stage 4 chronic kidney disease, or unspecified chronic kidney disease: Secondary | ICD-10-CM | POA: Diagnosis not present

## 2019-01-20 DIAGNOSIS — D631 Anemia in chronic kidney disease: Secondary | ICD-10-CM | POA: Diagnosis not present

## 2019-01-20 DIAGNOSIS — E875 Hyperkalemia: Secondary | ICD-10-CM | POA: Diagnosis not present

## 2019-03-20 IMAGING — DX DG CHEST 1V
1 series · 1 of 1 positions shown · non-contrast
Comparison: Thoracic radiographs dated 12/29/2009

CLINICAL DATA: Pre operative respiratory exam.  Left hip fracture.

EXAM:
CHEST  1 VIEW

[chest pa]
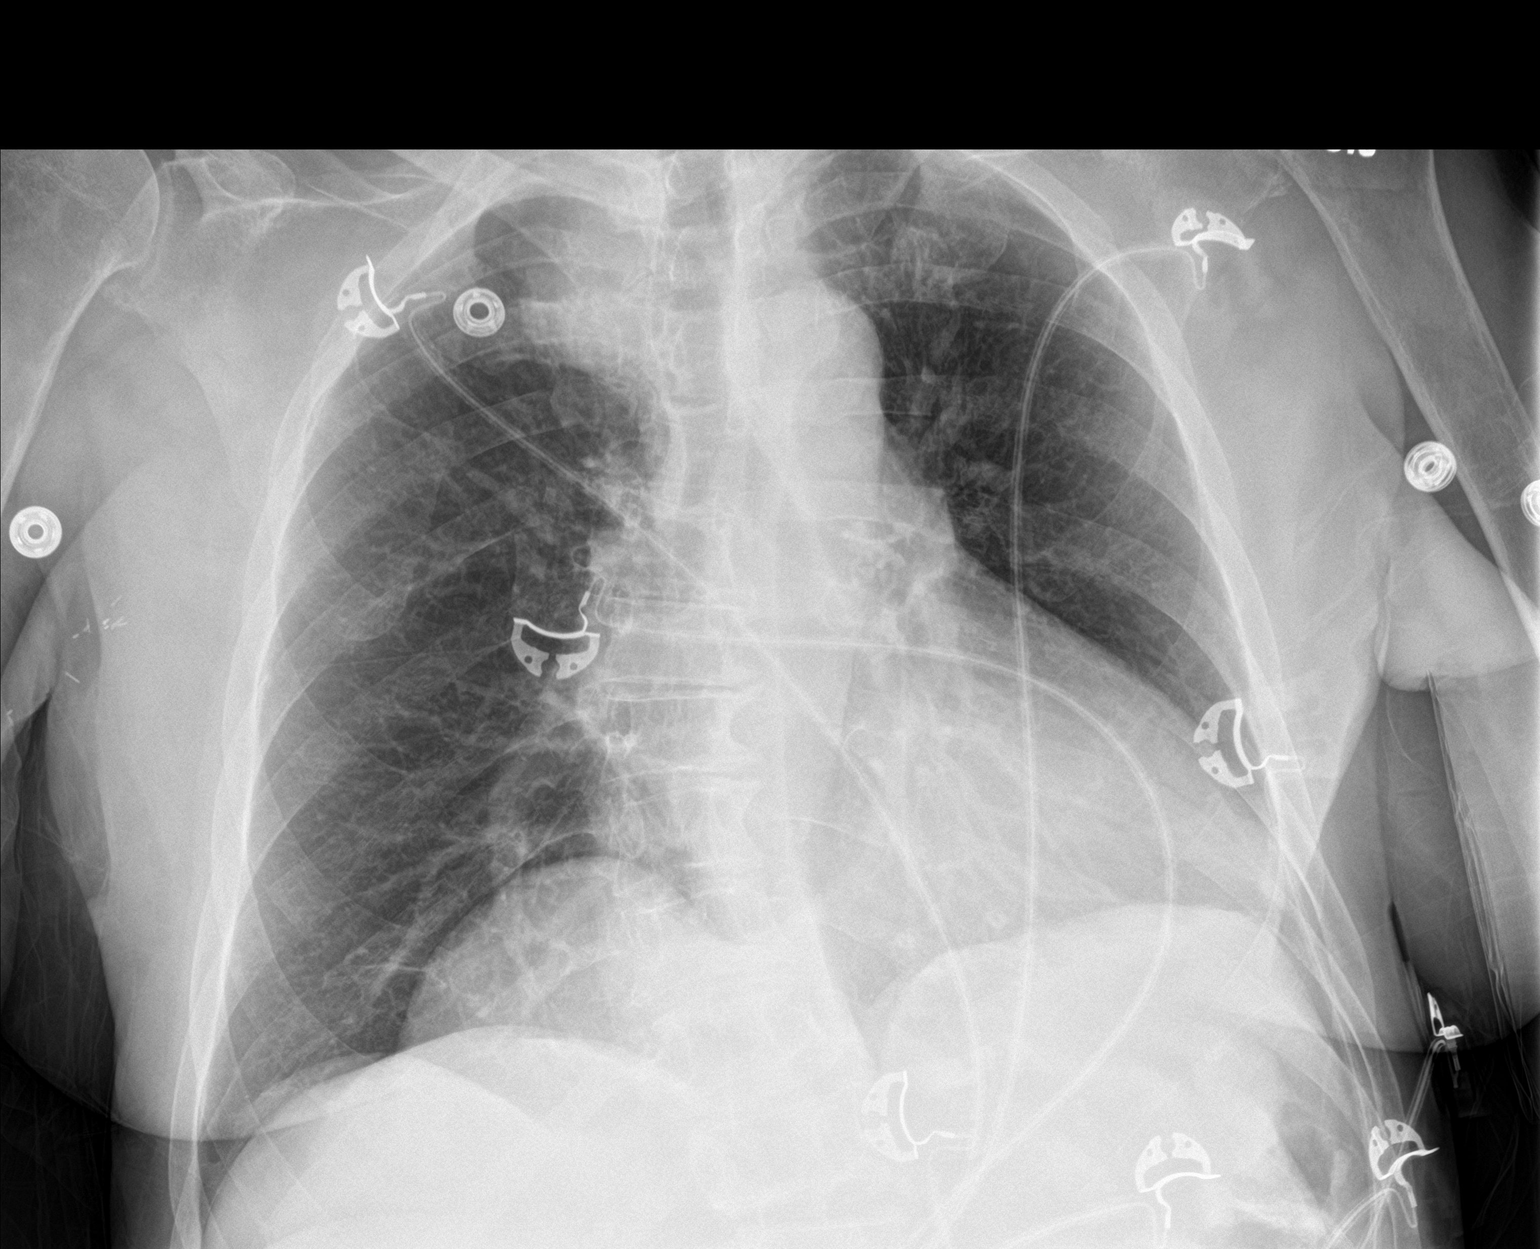

[1 of 1 positions shown; findings below may reference images not displayed]

FINDINGS: The heart size and pulmonary vascularity are normal. Lungs are
clear. Tortuosity of the brachiocephalic vessels. Chronic lobulation
of the right hemidiaphragm. Thoracolumbar scoliosis, unchanged.
IMPRESSION: No acute abnormality of the chest.

## 2019-03-20 IMAGING — DX DG HIP (WITH OR WITHOUT PELVIS) 1V PORT*L*
2 series · 2 of 2 positions shown · non-contrast
Comparison: 04/06/2018

CLINICAL DATA: Hip fracture.

EXAM:
DG HIP (WITH OR WITHOUT PELVIS) 1V PORT LEFT

[pelvis]
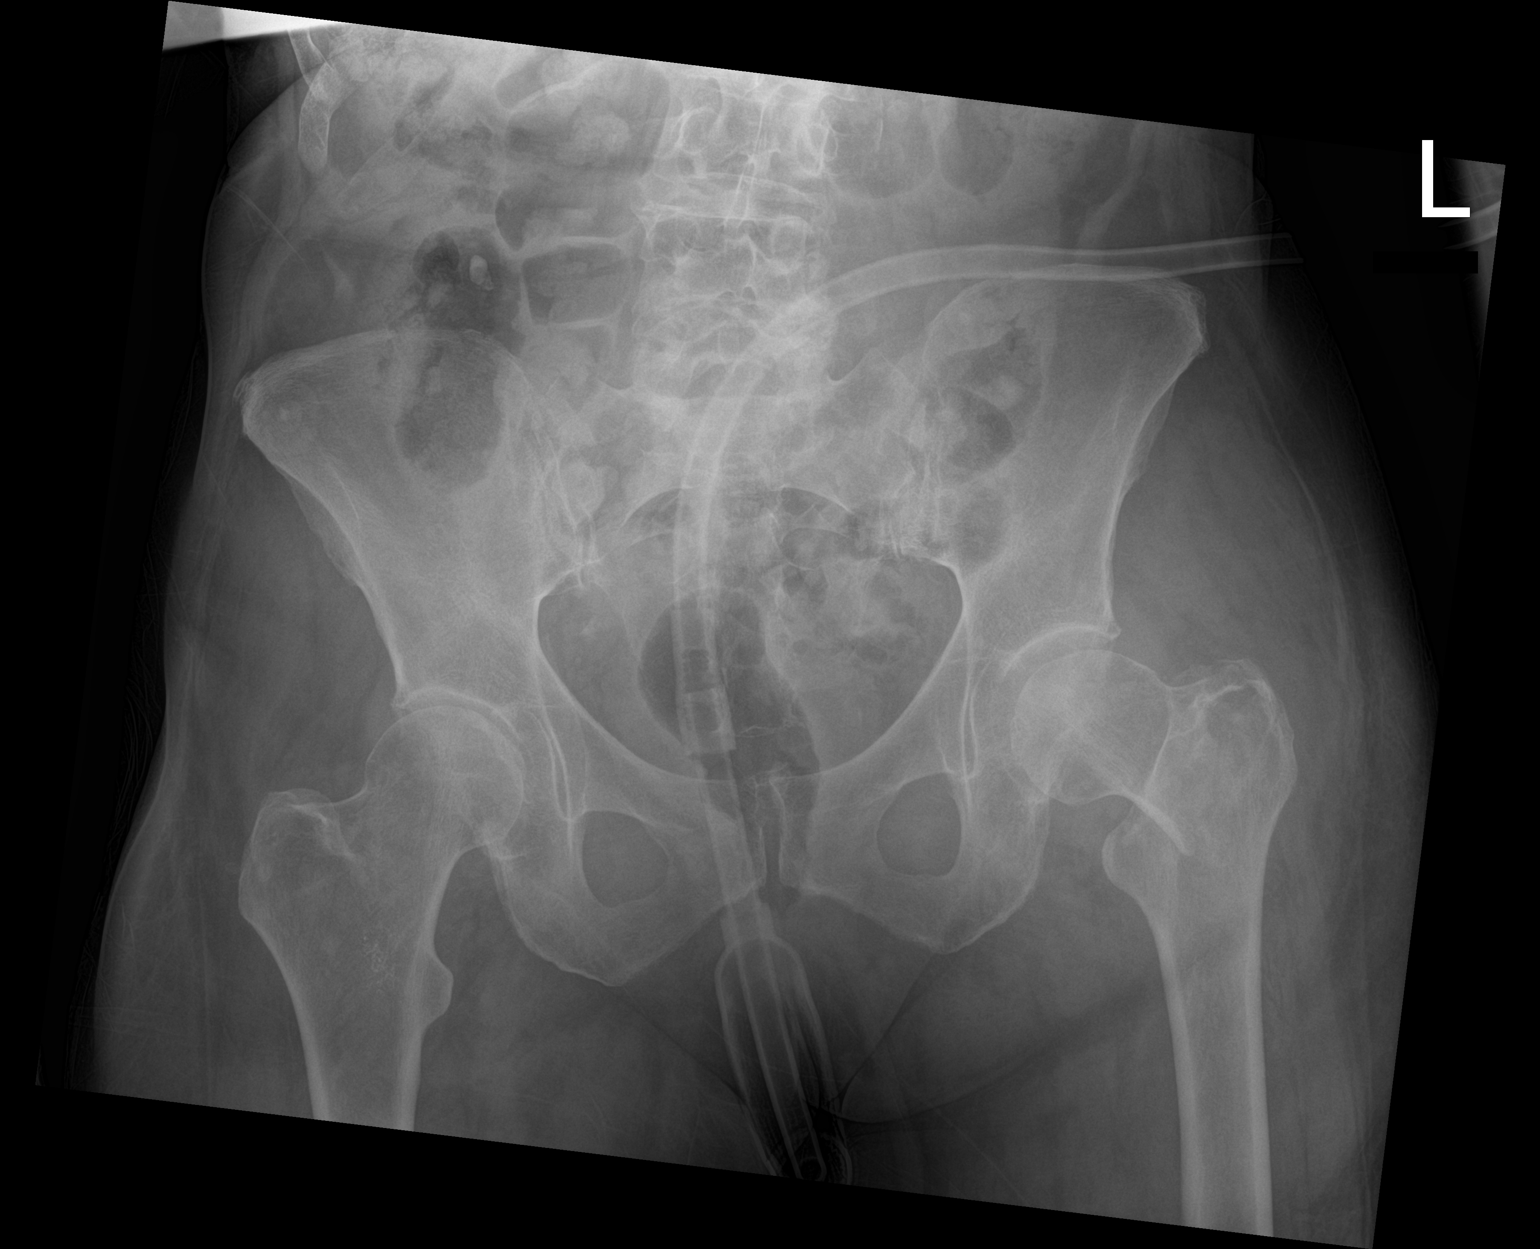

[hip]
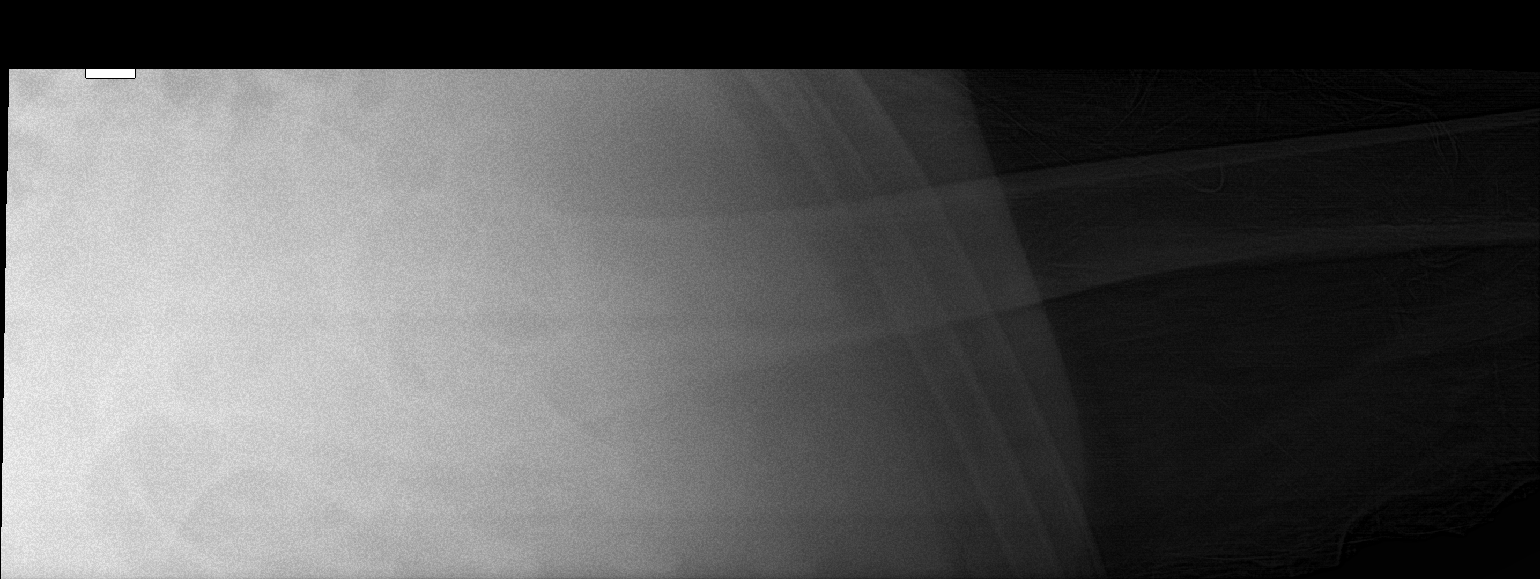

[2 of 2 positions shown; findings below may reference images not displayed]

FINDINGS: No significant change in the appearance of distal left femoral neck
fracture, which involves the greater trochanter. Impaction of the
distal fracture fragment, similar to the prior image.

There is overlying soft tissue swelling.
IMPRESSION: Impacted fracture of the distal left femoral neck, which involves
the greater trochanter.

## 2019-03-21 IMAGING — RF DG FEMUR 2+V*L*
1 series · 8 of 8 positions shown · non-contrast
Comparison: 04/06/2018

CLINICAL DATA: Intramedullary rod

EXAM:
DG C-ARM 61-120 MIN; LEFT FEMUR 2 VIEWS

[Series 1: run · 8 of 8 slices shown]
[im 1/8]
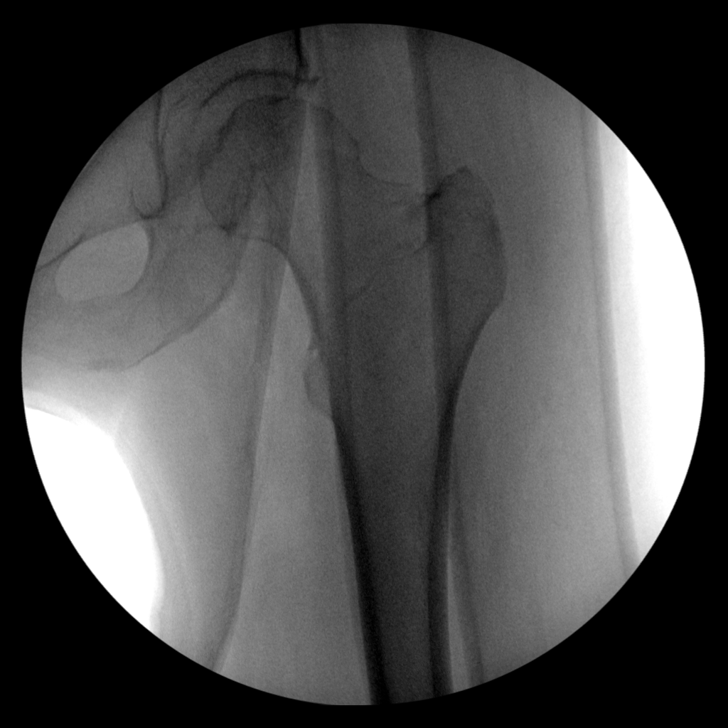
[im 2/8]
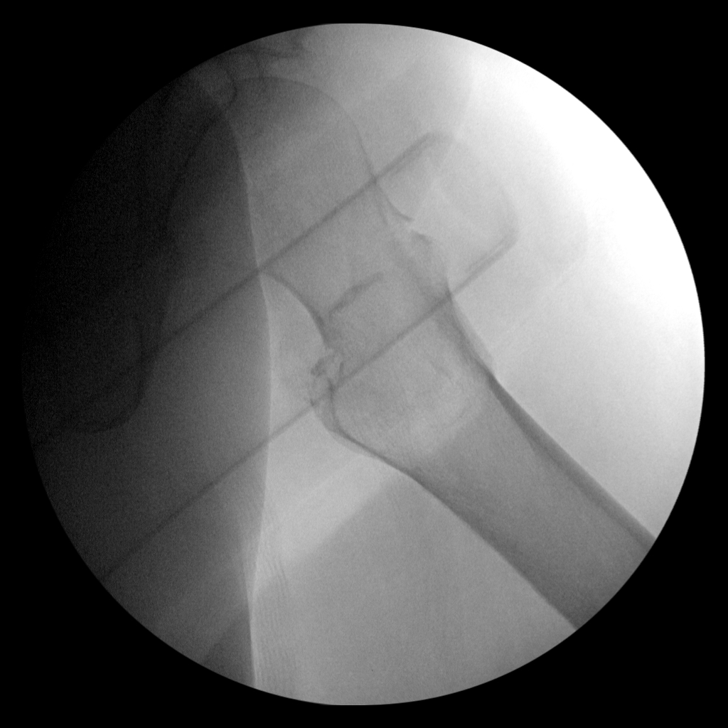
[im 3/8]
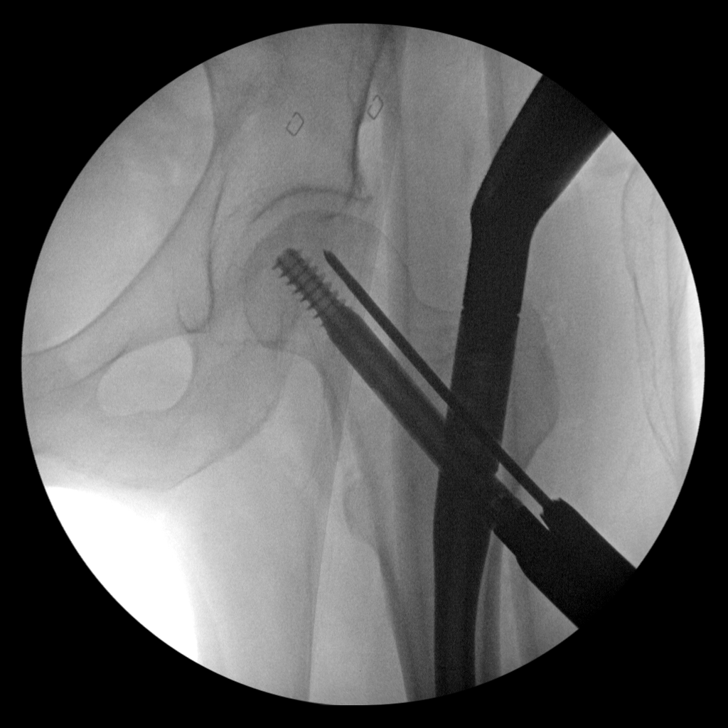
[im 4/8]
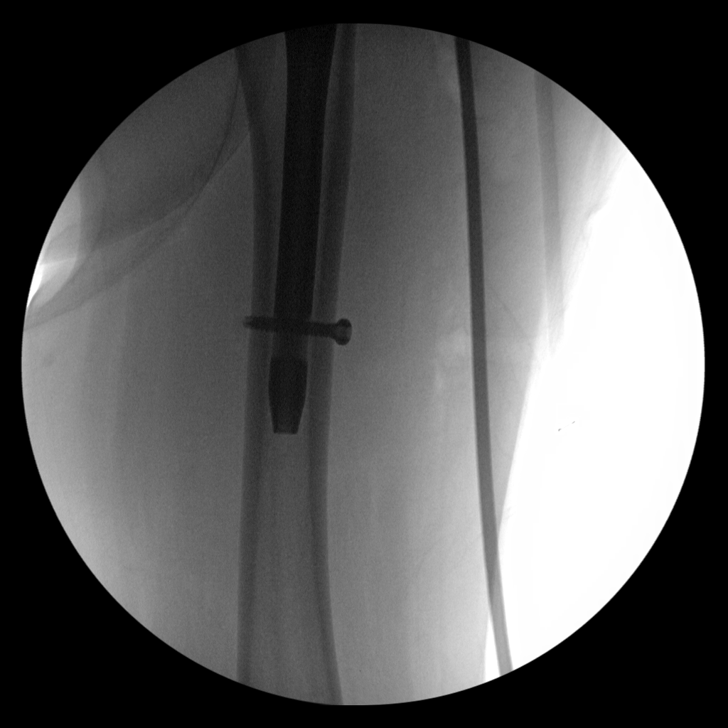
[im 5/8]
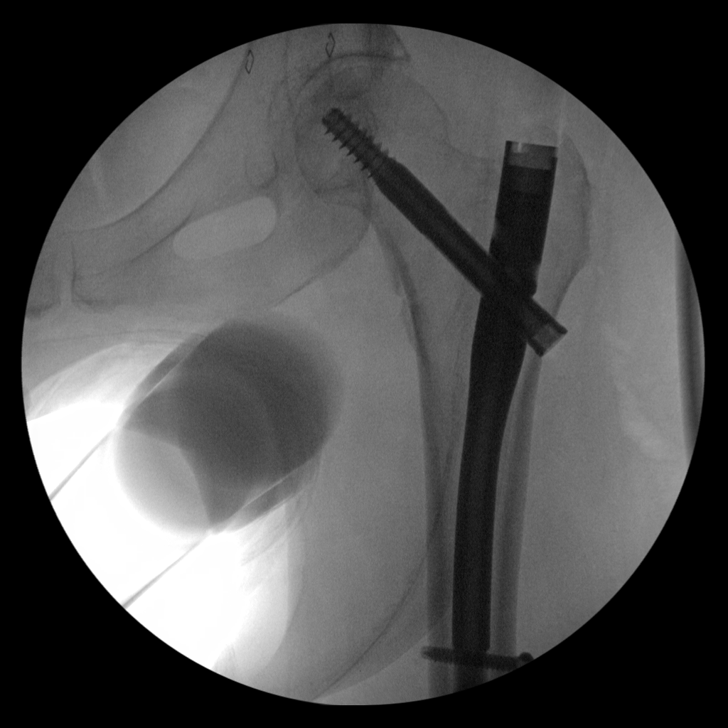
[im 6/8]
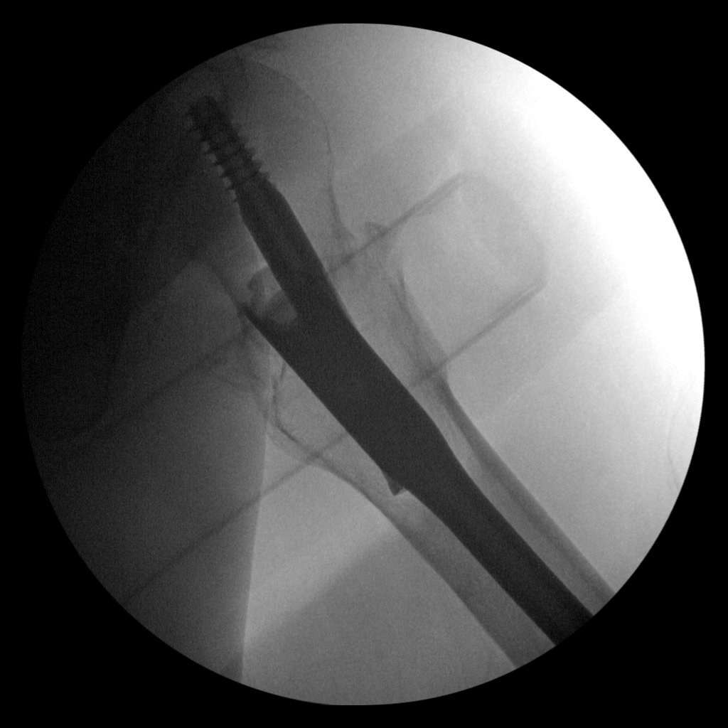
[im 7/8]
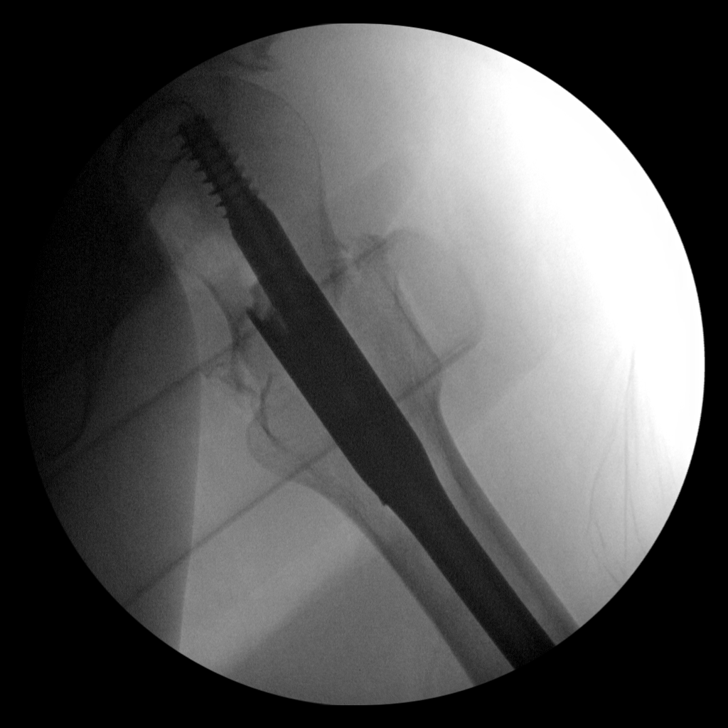
[im 8/8]
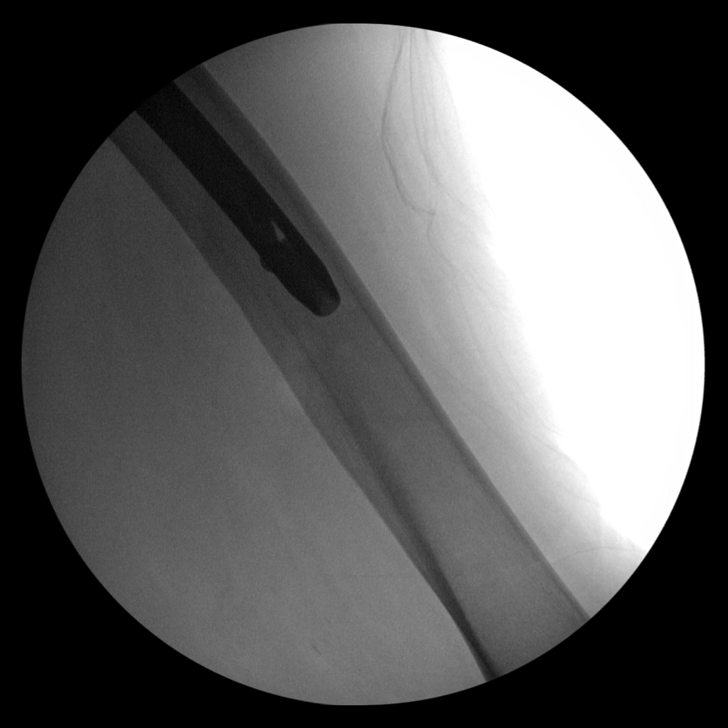

[8 of 8 positions shown; findings below may reference images not displayed]

FINDINGS: Eight low resolution intraoperative spot views of the left femur.
Total fluoroscopy time was 1 minutes 32 seconds.

Initial fluoroscopic images demonstrate proximal left femur
fracture. Subsequent images demonstrate placement of intramedullary
rod and fixating screws with anatomic alignment
IMPRESSION: Intraoperative fluoroscopic assistance provided during surgical
fixation of proximal left femur fracture

## 2019-04-22 DIAGNOSIS — M1612 Unilateral primary osteoarthritis, left hip: Secondary | ICD-10-CM | POA: Diagnosis not present

## 2019-04-22 DIAGNOSIS — E875 Hyperkalemia: Secondary | ICD-10-CM | POA: Diagnosis not present

## 2019-04-22 DIAGNOSIS — I129 Hypertensive chronic kidney disease with stage 1 through stage 4 chronic kidney disease, or unspecified chronic kidney disease: Secondary | ICD-10-CM | POA: Diagnosis not present

## 2019-04-22 DIAGNOSIS — Z9114 Patient's other noncompliance with medication regimen: Secondary | ICD-10-CM | POA: Diagnosis not present

## 2019-04-22 DIAGNOSIS — D631 Anemia in chronic kidney disease: Secondary | ICD-10-CM | POA: Diagnosis not present

## 2019-04-22 DIAGNOSIS — N189 Chronic kidney disease, unspecified: Secondary | ICD-10-CM | POA: Diagnosis not present

## 2019-04-22 DIAGNOSIS — N2581 Secondary hyperparathyroidism of renal origin: Secondary | ICD-10-CM | POA: Diagnosis not present

## 2019-04-22 DIAGNOSIS — N184 Chronic kidney disease, stage 4 (severe): Secondary | ICD-10-CM | POA: Diagnosis not present

## 2019-08-09 DIAGNOSIS — N184 Chronic kidney disease, stage 4 (severe): Secondary | ICD-10-CM | POA: Diagnosis not present

## 2019-08-09 DIAGNOSIS — N390 Urinary tract infection, site not specified: Secondary | ICD-10-CM | POA: Diagnosis not present

## 2019-09-03 DIAGNOSIS — E039 Hypothyroidism, unspecified: Secondary | ICD-10-CM | POA: Diagnosis not present

## 2019-09-03 DIAGNOSIS — R1030 Lower abdominal pain, unspecified: Secondary | ICD-10-CM | POA: Diagnosis not present

## 2019-09-03 DIAGNOSIS — F330 Major depressive disorder, recurrent, mild: Secondary | ICD-10-CM | POA: Diagnosis not present

## 2019-09-03 DIAGNOSIS — Z79899 Other long term (current) drug therapy: Secondary | ICD-10-CM | POA: Diagnosis not present

## 2019-09-08 DIAGNOSIS — Z0100 Encounter for examination of eyes and vision without abnormal findings: Secondary | ICD-10-CM | POA: Diagnosis not present

## 2019-09-08 DIAGNOSIS — H5210 Myopia, unspecified eye: Secondary | ICD-10-CM | POA: Diagnosis not present

## 2019-10-04 DIAGNOSIS — E039 Hypothyroidism, unspecified: Secondary | ICD-10-CM | POA: Diagnosis not present

## 2019-10-04 DIAGNOSIS — Z6826 Body mass index (BMI) 26.0-26.9, adult: Secondary | ICD-10-CM | POA: Diagnosis not present

## 2019-10-04 DIAGNOSIS — F339 Major depressive disorder, recurrent, unspecified: Secondary | ICD-10-CM | POA: Diagnosis not present

## 2019-12-16 DIAGNOSIS — I10 Essential (primary) hypertension: Secondary | ICD-10-CM | POA: Diagnosis not present

## 2019-12-16 DIAGNOSIS — F339 Major depressive disorder, recurrent, unspecified: Secondary | ICD-10-CM | POA: Diagnosis not present

## 2019-12-16 DIAGNOSIS — Z6826 Body mass index (BMI) 26.0-26.9, adult: Secondary | ICD-10-CM | POA: Diagnosis not present

## 2020-03-07 DIAGNOSIS — N2581 Secondary hyperparathyroidism of renal origin: Secondary | ICD-10-CM | POA: Diagnosis not present

## 2020-03-07 DIAGNOSIS — M1612 Unilateral primary osteoarthritis, left hip: Secondary | ICD-10-CM | POA: Diagnosis not present

## 2020-03-07 DIAGNOSIS — I129 Hypertensive chronic kidney disease with stage 1 through stage 4 chronic kidney disease, or unspecified chronic kidney disease: Secondary | ICD-10-CM | POA: Diagnosis not present

## 2020-03-07 DIAGNOSIS — N189 Chronic kidney disease, unspecified: Secondary | ICD-10-CM | POA: Diagnosis not present

## 2020-03-07 DIAGNOSIS — E875 Hyperkalemia: Secondary | ICD-10-CM | POA: Diagnosis not present

## 2020-03-07 DIAGNOSIS — D631 Anemia in chronic kidney disease: Secondary | ICD-10-CM | POA: Diagnosis not present

## 2020-03-07 DIAGNOSIS — Z9114 Patient's other noncompliance with medication regimen: Secondary | ICD-10-CM | POA: Diagnosis not present

## 2020-03-07 DIAGNOSIS — N184 Chronic kidney disease, stage 4 (severe): Secondary | ICD-10-CM | POA: Diagnosis not present

## 2020-03-09 DIAGNOSIS — N390 Urinary tract infection, site not specified: Secondary | ICD-10-CM | POA: Diagnosis not present

## 2020-03-23 DIAGNOSIS — F329 Major depressive disorder, single episode, unspecified: Secondary | ICD-10-CM | POA: Diagnosis not present

## 2020-03-23 DIAGNOSIS — Z23 Encounter for immunization: Secondary | ICD-10-CM | POA: Diagnosis not present

## 2020-03-23 DIAGNOSIS — N184 Chronic kidney disease, stage 4 (severe): Secondary | ICD-10-CM | POA: Diagnosis not present

## 2020-03-23 DIAGNOSIS — I10 Essential (primary) hypertension: Secondary | ICD-10-CM | POA: Diagnosis not present

## 2020-04-14 ENCOUNTER — Other Ambulatory Visit: Payer: Self-pay

## 2020-04-14 DIAGNOSIS — N184 Chronic kidney disease, stage 4 (severe): Secondary | ICD-10-CM

## 2020-04-27 ENCOUNTER — Ambulatory Visit (HOSPITAL_COMMUNITY)
Admission: RE | Admit: 2020-04-27 | Discharge: 2020-04-27 | Disposition: A | Discharge status: Home / Self Care | Payer: Medicare HMO | Source: Ambulatory Visit | Attending: Vascular Surgery | Admitting: Vascular Surgery

## 2020-04-27 ENCOUNTER — Other Ambulatory Visit: Payer: Self-pay

## 2020-04-27 DIAGNOSIS — Z0389 Encounter for observation for other suspected diseases and conditions ruled out: Secondary | ICD-10-CM | POA: Diagnosis not present

## 2020-04-27 DIAGNOSIS — N184 Chronic kidney disease, stage 4 (severe): Secondary | ICD-10-CM | POA: Insufficient documentation

## 2020-05-01 ENCOUNTER — Encounter: Payer: Medicare HMO | Admitting: Vascular Surgery

## 2020-05-22 ENCOUNTER — Encounter: Payer: Medicare HMO | Admitting: Vascular Surgery

## 2020-06-04 IMAGING — US US RENAL
1 series · 14 of 25 positions shown · non-contrast
Comparison: Abdominal ultrasound 12/06/2016.

CLINICAL DATA: Chronic kidney disease.  Acute renal failure.

EXAM:
RENAL / URINARY TRACT ULTRASOUND COMPLETE

[Series 1: us renal · 0.25mm/px · 14 of 33 slices shown]
[im 1/33]
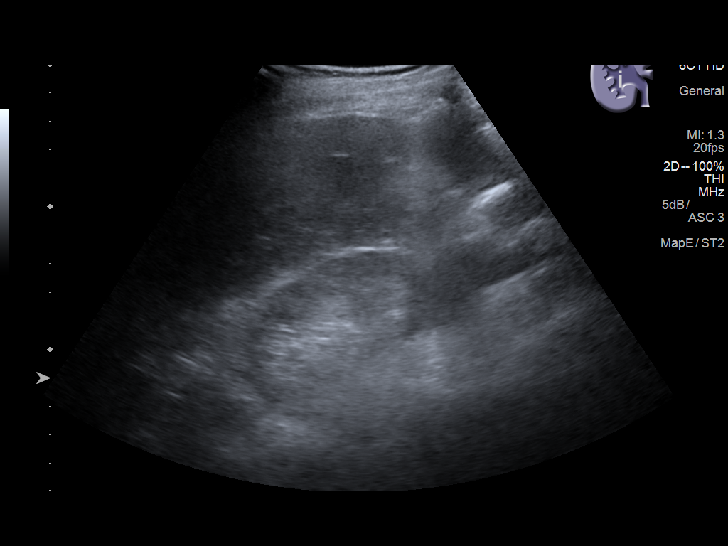
[im 3/33]
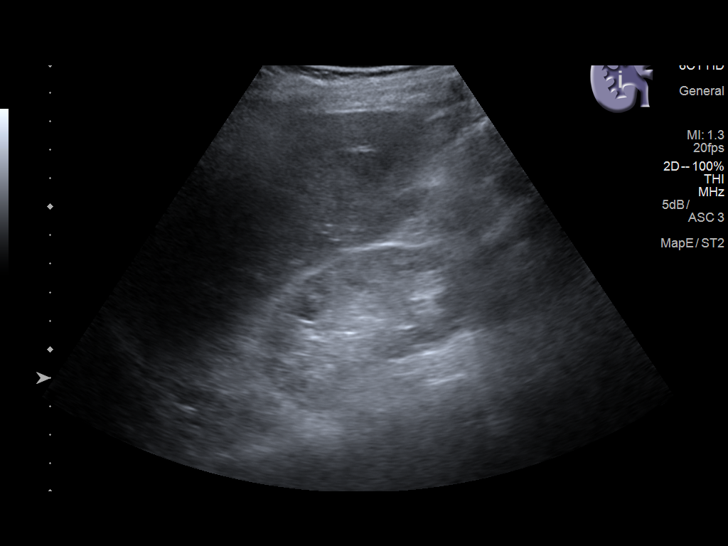
[im 6/33]
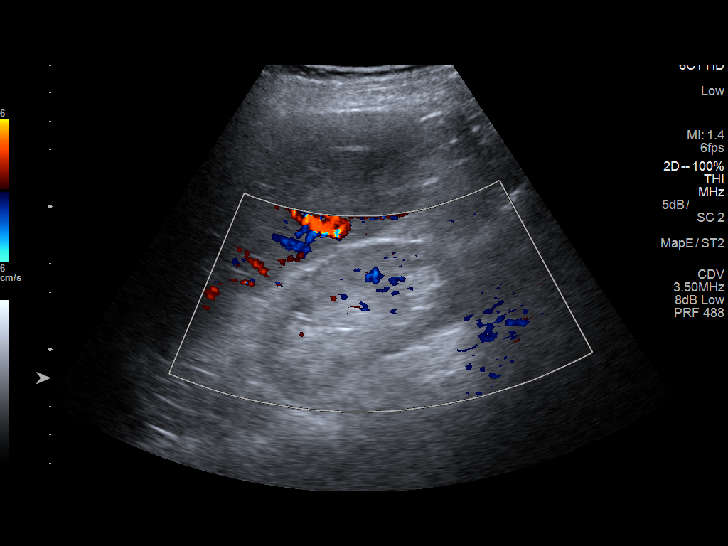
[im 9/33]
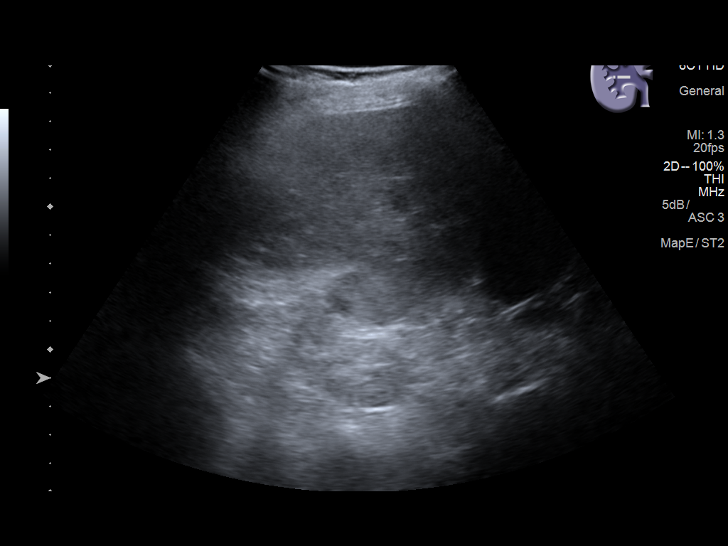
[im 11/33]
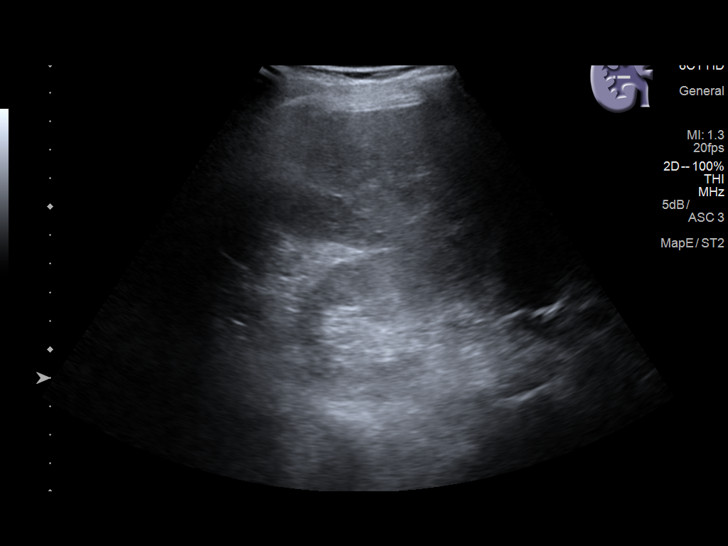
[im 13/33]
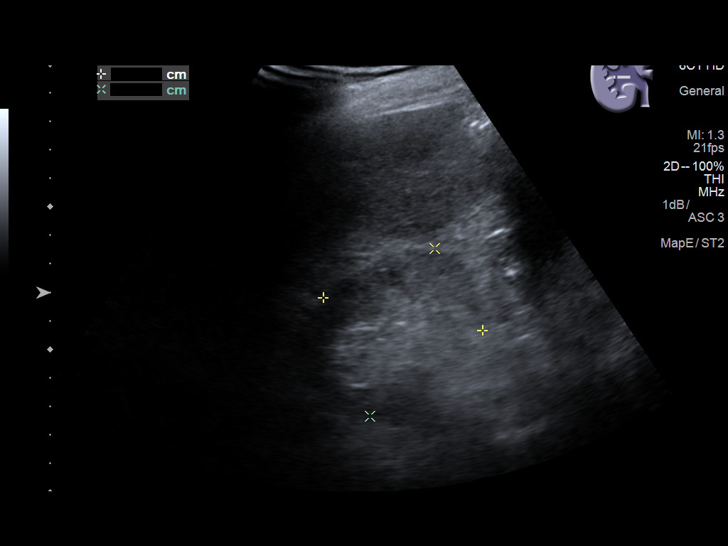
[im 15/33]
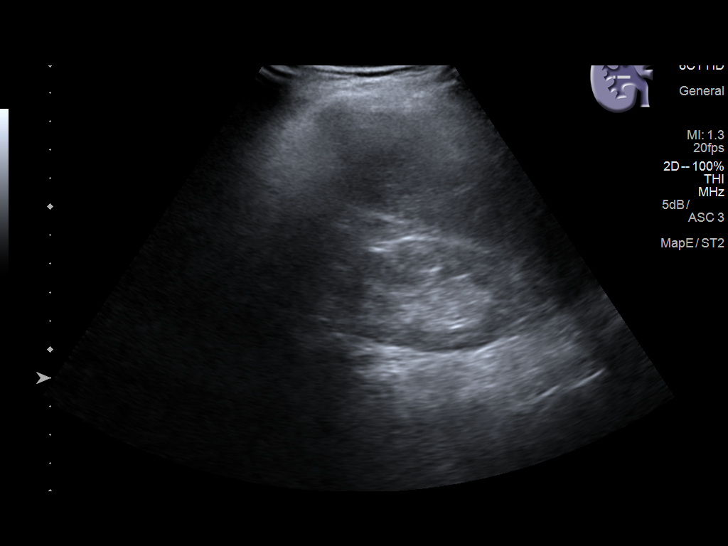
[im 18/33]
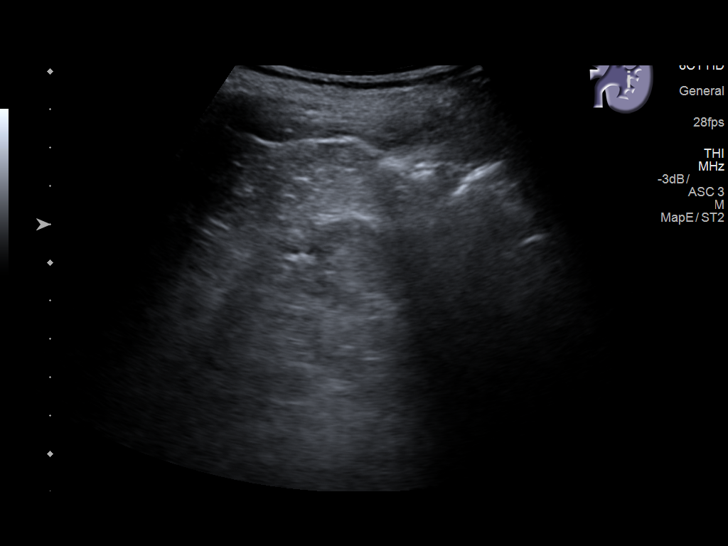
[im 21/33]
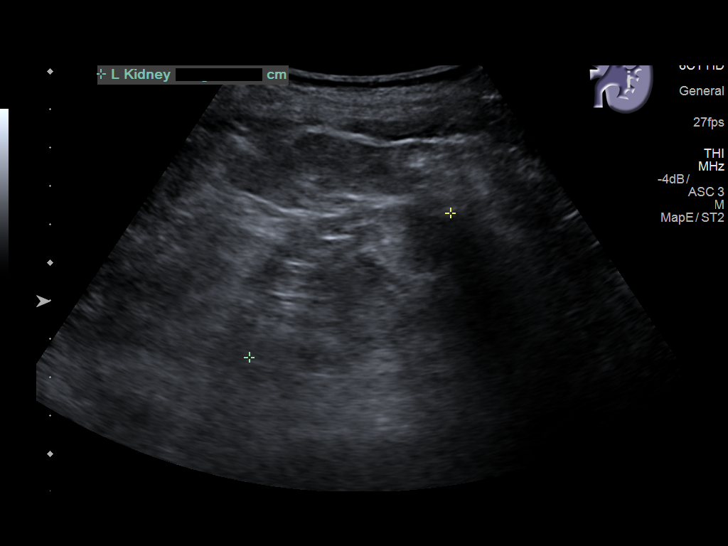
[im 22/33]
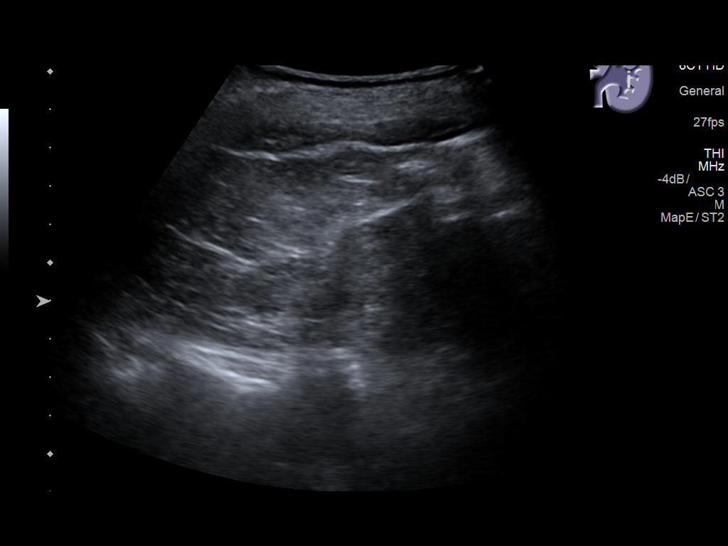
[im 25/33]
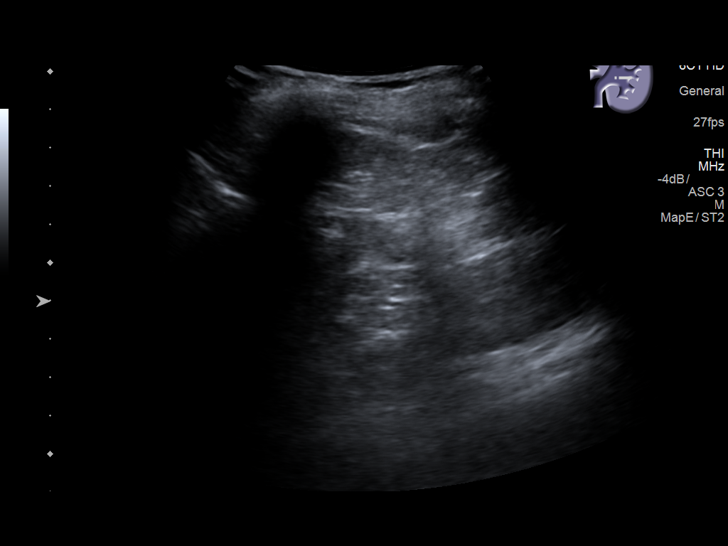
[im 27/33]
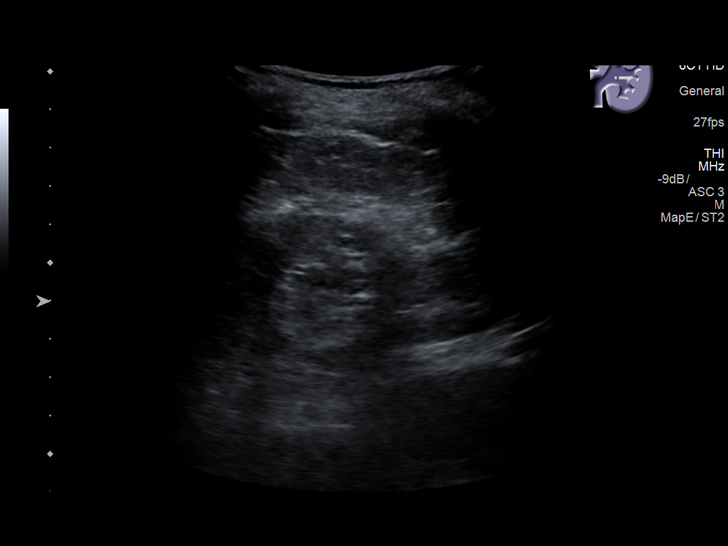
[im 30/33]
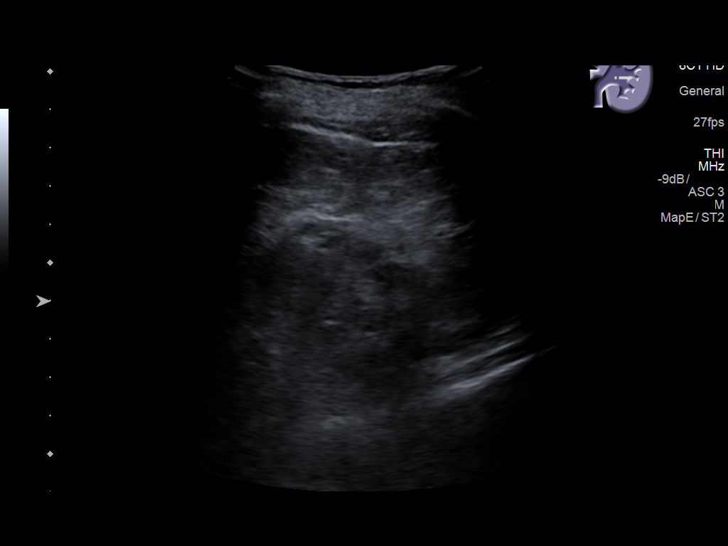
[im 33/33]
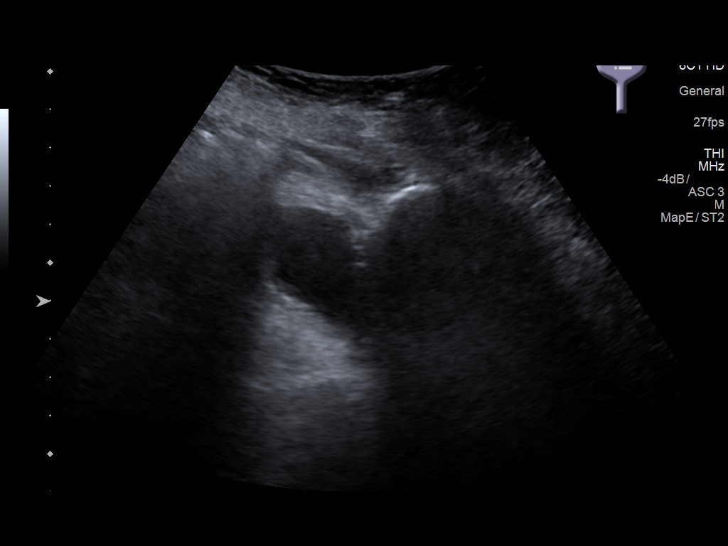

[14 of 25 positions shown; findings below may reference images not displayed]

FINDINGS: Right Kidney:

Renal measurements: 7.3 x 4.0 x 5.7 cm = volume: 166 mL. Renal
parenchyma is thinned. Parenchyma is diffusely hyperechoic. No focal
lesions are present. There is no stone or mass lesion. No
hydronephrosis is present.

Left Kidney:

Renal measurements: 6.5 x 4.0 x 4.1 cm = volume: 107 mL. Renal
parenchyma is thinned. Parenchyma is diffusely hyperechoic. No focal
lesions are present. There is no stone or mass lesion. No
hydronephrosis is present.

Bladder:

Appears normal for degree of bladder distention.
IMPRESSION: Small echogenic kidneys bilaterally. The finding is nonspecific, but
can be seen in the setting of medical renal disease. There is no
significant interval change.

## 2020-06-05 ENCOUNTER — Other Ambulatory Visit: Payer: Self-pay

## 2020-06-05 ENCOUNTER — Encounter: Payer: Self-pay | Admitting: Vascular Surgery

## 2020-06-05 ENCOUNTER — Ambulatory Visit: Payer: Medicare HMO | Admitting: Vascular Surgery

## 2020-06-05 VITALS — BP 144/89 | HR 75 | Temp 97.0°F | Resp 14 | Ht 63.5 in | Wt 136.0 lb

## 2020-06-05 DIAGNOSIS — N184 Chronic kidney disease, stage 4 (severe): Secondary | ICD-10-CM | POA: Diagnosis not present

## 2020-06-05 NOTE — Progress Notes (Signed)
[facility] [place]  Patient name: Sandra Hunter MRN: [id number] DOB: 1940/10/25 Sex: female  REASON FOR CONSULT: Discuss access for hemodialysis  Nephrologist: [place]  HPI: Sandra Hunter is a 80 y.o. female, here today for discussion of hemodialysis access options.  She is not on hemodialysis.  He is listed as stage IV.  She is right-handed.  She has no history of pacemaker placement.  Her renal insufficiency is based on hypertension.  Past Medical History:  Diagnosis Date  . Arthritis    fingers  . CKD (chronic kidney disease)   . CKD (chronic kidney disease) stage 4, GFR 15-29 ml/min (HCC) 04/06/2018  . Hypertension    Sandra Hunter  . Hypothyroidism   . Mitral regurgitation     Family History  Problem Relation Age of Onset  . Stroke Mother     SOCIAL HISTORY: Social History   Socioeconomic History  . Marital status: Married    Spouse name: Not on file  . Number of children: 2  . Years of education: Not on file  . Highest education level: Not on file  Occupational History  . Occupation: Insurance claims handler: RETIRED  Tobacco Use  . Smoking status: Never Smoker  . Smokeless tobacco: Never Used  Vaping Use  . Vaping Use: Never used  Substance and Sexual Activity  . Alcohol use: Yes    Alcohol/week: 21.0 standard drinks    Types: 21 Glasses of wine per week    Comment: 3 glasses of wine daily  . Drug use: No  . Sexual activity: Not on file  Other Topics Concern  . Not on file  Social History Narrative   WAlks for exercise   Associate degree   5 cups coffee daily   Social Determinants of Health   Financial Resource Strain: Not on file  Food Insecurity: Not on file  Transportation Needs: Not on file  Physical Activity: Not on file  Stress: Not on file  Social Connections: Not on file  Intimate Partner Violence: Not on file    No Known Allergies  Current Outpatient Medications  Medication Sig  Dispense Refill  . amLODipine (NORVASC) 10 MG tablet Take 10 mg by mouth daily.  5  . calcitRIOL (ROCALTROL) 0.25 MCG capsule Take 0.25 mcg by mouth daily.    Marland Kitchen escitalopram (LEXAPRO) 10 MG tablet     . furosemide (LASIX) 20 MG tablet Take 20 mg by mouth daily.    Marland Kitchen levothyroxine (SYNTHROID) 50 MCG tablet Take 50 mcg by mouth daily before breakfast.    . metoprolol succinate (TOPROL-XL) 25 MG 24 hr tablet      No current facility-administered medications for this visit.    REVIEW OF SYSTEMS:  '[X]'$  denotes positive finding, '[ ]'$  denotes negative finding Cardiac  Comments:  Chest pain or chest pressure:    Shortness of breath upon exertion: x   Short of breath when lying flat:    Irregular heart rhythm:        Vascular    Pain in calf, thigh, or hip brought on by ambulation: x   Pain in feet at night that wakes you up from your sleep:     Blood clot in your veins:    Leg swelling:         Pulmonary    Oxygen at home:    Productive cough:     Wheezing:  Neurologic    Sudden weakness in arms or legs:     Sudden numbness in arms or legs:     Sudden onset of difficulty speaking or slurred speech:    Temporary loss of vision in one eye:     Problems with dizziness:         Gastrointestinal    Blood in stool:     Vomited blood:         Genitourinary    Burning when urinating:     Blood in urine:        Psychiatric    Major depression:         Hematologic    Bleeding problems:    Problems with blood clotting too easily:        Skin    Rashes or ulcers:        Constitutional    Fever or chills:      PHYSICAL EXAM: Vitals:   06/05/20 1103  BP: (!) 144/89  Pulse: 75  Resp: 14  Temp: (!) 97 F (36.1 C)  TempSrc: Other (Comment)  SpO2: 98%  Weight: 136 lb (61.7 kg)  Height: 5' 3.5" (1.613 m)    GENERAL: The patient is a well-nourished female, in no acute distress. The vital signs are documented above. VASCULAR: 2+ radial pulses bilaterally.  She does  have a visible basilic vein at the left antecubital space.  Otherwise cephalic and basilic veins are not visualized. PULMONARY: There is good air exchange MUSCULOSKELETAL: There are no major deformities or cyanosis. NEUROLOGIC: No focal weakness or paresthesias are detected. SKIN: There are no ulcers or rashes noted. PSYCHIATRIC: The patient has a normal affect.  DATA:   Vein map from [facility] on 04/27/2020 was reviewed.  This reveals relatively small surface veins bilaterally.  Her cephalic vein is an adequate for access bilaterally.  She does have moderate sized left basilic vein  I imaged her basilic vein with SonoSite and this does appear to be adequate for fistula attempt.  MEDICAL ISSUES:  I had a long discussion with the patient regarding access for hemodialysis.  I discussed options to include tunneled hemodialysis catheter for urgent access and also AV graft and AV fistula.  Discussed the benefits and disadvantages of each of these.  I do feel that she is a candidate for for stage left basilic vein fistula.  Explained this would be done as an outpatient.  Also explained that she would then be seen in the office at 4 to 6 weeks and if she had had nice maturation would plan a second stage transposition.  She has an appointment with Dr. Marval Regal tomorrow.  She will discuss this further with him and plan outpatient surgery if all are in agreement   Rosetta Posner, MD [facility] phone [phone]

## 2020-06-06 DIAGNOSIS — Z9114 Patient's other noncompliance with medication regimen: Secondary | ICD-10-CM | POA: Diagnosis not present

## 2020-06-06 DIAGNOSIS — N2581 Secondary hyperparathyroidism of renal origin: Secondary | ICD-10-CM | POA: Diagnosis not present

## 2020-06-06 DIAGNOSIS — M1612 Unilateral primary osteoarthritis, left hip: Secondary | ICD-10-CM | POA: Diagnosis not present

## 2020-06-06 DIAGNOSIS — N184 Chronic kidney disease, stage 4 (severe): Secondary | ICD-10-CM | POA: Diagnosis not present

## 2020-06-06 DIAGNOSIS — I129 Hypertensive chronic kidney disease with stage 1 through stage 4 chronic kidney disease, or unspecified chronic kidney disease: Secondary | ICD-10-CM | POA: Diagnosis not present

## 2020-06-06 DIAGNOSIS — E875 Hyperkalemia: Secondary | ICD-10-CM | POA: Diagnosis not present

## 2020-06-06 DIAGNOSIS — D631 Anemia in chronic kidney disease: Secondary | ICD-10-CM | POA: Diagnosis not present

## 2020-06-06 DIAGNOSIS — N189 Chronic kidney disease, unspecified: Secondary | ICD-10-CM | POA: Diagnosis not present

## 2020-06-08 DIAGNOSIS — N184 Chronic kidney disease, stage 4 (severe): Secondary | ICD-10-CM | POA: Diagnosis not present

## 2020-06-09 ENCOUNTER — Other Ambulatory Visit: Payer: Self-pay

## 2020-06-15 NOTE — Patient Instructions (Signed)
Sandra Hunter  06/15/2020     '@PREFPERIOPPHARMACY'$ @   Your procedure is scheduled on  06/22/2020.   Report to [facility] at  [phone]  A.M.   Call this number if you have problems the morning of surgery:  [phone]   Remember:  Do not eat or drink after midnight.                       Take these medicines the morning of surgery with A SIP OF WATER          Amlodipine, lexapro, levothyroxine, metoprolol.      Do not wear jewelry, make-up or nail polish.  Do not wear lotions, powders, or perfumes, or deodorant.  Do not shave 48 hours prior to surgery.  Men may shave face and neck.  Do not bring valuables to the hospital.  [facility] is not responsible for any belongings or valuables.  Contacts, dentures or bridgework may not be worn into surgery.  Leave your suitcase in the car.  After surgery it may be brought to your room.  For patients admitted to the hospital, discharge time will be determined by your treatment team.  Patients discharged the day of surgery will not be allowed to drive home and must have someone with them for 24 hours.    Shower with CHG the night before and the morning of your procedure with CHG. DO NOT use CHG on your face, hair or genitals.   After your night shower, dry off with a clean towel, put on clean clothes to sleep in and place clean sheets on your bed before you sleep.   After your morning shower, dry off with a clean towel, put on clean, comfortable clothes and brush your teeth before you come to the hospital.    Special instructions:   DO NOT smoke tobacco or vape the morning of your procedure.    Please read over the following fact sheets that you were given. Coughing and Deep Breathing, Surgical Site Infection Prevention, Anesthesia Post-op Instructions and Care and Recovery After Surgery       AV Fistula Placement, Care After The following information offers guidance on how to care for yourself after your  procedure. Your health care provider may also give you more specific instructions. If you have problems or questions, contact your health care provider. What can I expect after the procedure? After the procedure, it is common to have:  Soreness at the fistula site.  Vibration (thrill) over the fistula. Follow these instructions at home: Medicines  Take over-the-counter and prescription medicines only as told by your health care provider.  Ask your health care provider if the medicine prescribed to you can cause constipation. You may need to take these actions to prevent or treat constipation: ? Drink enough fluid to keep your urine pale yellow. ? Take over-the-counter or prescription medicines. ? Eat foods that are high in fiber, such as beans, whole grains, and fresh fruits and vegetables. ? Limit foods that are high in fat and processed sugars, such as fried or sweet foods. Incision care Follow instructions from your health care provider about how to take care of your incision. Make sure you:  Wash your hands with soap and water for at least 20 seconds before and after you change your bandage (dressing). If soap and water are not available, use hand sanitizer.  Change your dressing as told by your health care  provider.  Leave stitches (sutures), skin glue, or adhesive strips in place. These skin closures may need to stay in place for 2 weeks or longer. If adhesive strip edges start to loosen and curl up, you may trim the loose edges. Do not remove adhesive strips completely unless your health care provider tells you to do that.   Fistula care  Check your fistula site every day to make sure the thrill feels the same.  Check your fistula site every day for signs of infection. Check for: ? More redness, swelling, or pain. ? Fluid or blood. ? Warmth. ? Pus or a bad smell.  Raise (elevate) the affected area above the level of your heart while you are sitting or lying down.  Do not  lift anything that is heavier than 10 lb (4.5 kg), or the limit that you are told, until your health care provider says that it is safe.  Do not lie down on your fistula arm.  Do not let anyone draw blood or take a blood pressure reading on your fistula arm. This is important.  Do not wear tight jewelry or clothing over your fistula arm.   Bathing  Do not take baths, swim, or use a hot tub until your health care provider approves. Ask your health care provider if you may take showers. You may only be allowed to take sponge baths.  Keep the area around your incision clean and dry. General instructions  Rest at home for a day or two.  If you were given a sedative during the procedure, it can affect you for several hours. Do not drive or operate machinery until your health care provider says that it is safe.  Return to your normal activities as told. Ask your health care provider what activities are safe for you.  Keep all follow-up visits. This is important. Contact a health care provider if:  You have more redness, swelling, or pain around your fistula site.  Your fistula site feels warm to the touch.  You have pus or a bad smell coming from your fistula site.  You have a fever or chills.  You feel numb or cold in your arm or your fistula site.  You feel a decrease or a change in the thrill over the fistula. Get help right away if:  You have bleeding from your fistula site that will not stop.  You have chest pain.  You have trouble breathing. These symptoms may represent a serious problem that is an emergency. Do not wait to see if the symptoms will go away. Get medical help right away. Call your local emergency services (911 in the [place] Do not drive yourself to the hospital. Summary  Follow instructions from your health care provider about how to take care of your incision.  Do not let anyone draw blood or take a blood pressure reading on your fistula arm. This is  important.  Contact a health care provider if you have a change in the thrill or have any signs of infection at your fistula site.  Keep all follow-up visits. This is important. This information is not intended to replace advice given to you by your health care provider. Make sure you discuss any questions you have with your health care provider. Document Revised: 12/08/2019 Document Reviewed: 12/08/2019 Elsevier Patient Education  [place] Anesthesia, Adult, Care After This sheet gives you information about how to care for yourself after your procedure. Your health care provider may also  give you more specific instructions. If you have problems or questions, contact your health care provider. What can I expect after the procedure? After the procedure, the following side effects are common:  Pain or discomfort at the IV site.  Nausea.  Vomiting.  Sore throat.  Trouble concentrating.  Feeling cold or chills.  Feeling weak or tired.  Sleepiness and fatigue.  Soreness and body aches. These side effects can affect parts of the body that were not involved in surgery. Follow these instructions at home: For the time period you were told by your health care provider:  Rest.  Do not participate in activities where you could fall or become injured.  Do not drive or use machinery.  Do not drink alcohol.  Do not take sleeping pills or medicines that cause drowsiness.  Do not make important decisions or sign legal documents.  Do not take care of children on your own.   Eating and drinking  Follow any instructions from your health care provider about eating or drinking restrictions.  When you feel hungry, start by eating small amounts of foods that are soft and easy to digest (bland), such as toast. Gradually return to your regular diet.  Drink enough fluid to keep your urine pale yellow.  If you vomit, rehydrate by drinking water, juice, or clear  broth. General instructions  If you have sleep apnea, surgery and certain medicines can increase your risk for breathing problems. Follow instructions from your health care provider about wearing your sleep device: ? Anytime you are sleeping, including during daytime naps. ? While taking prescription pain medicines, sleeping medicines, or medicines that make you drowsy.  Have a responsible adult stay with you for the time you are told. It is important to have someone help care for you until you are awake and alert.  Return to your normal activities as told by your health care provider. Ask your health care provider what activities are safe for you.  Take over-the-counter and prescription medicines only as told by your health care provider.  If you smoke, do not smoke without supervision.  Keep all follow-up visits as told by your health care provider. This is important. Contact a health care provider if:  You have nausea or vomiting that does not get better with medicine.  You cannot eat or drink without vomiting.  You have pain that does not get better with medicine.  You are unable to pass urine.  You develop a skin rash.  You have a fever.  You have redness around your IV site that gets worse. Get help right away if:  You have difficulty breathing.  You have chest pain.  You have blood in your urine or stool, or you vomit blood. Summary  After the procedure, it is common to have a sore throat or nausea. It is also common to feel tired.  Have a responsible adult stay with you for the time you are told. It is important to have someone help care for you until you are awake and alert.  When you feel hungry, start by eating small amounts of foods that are soft and easy to digest (bland), such as toast. Gradually return to your regular diet.  Drink enough fluid to keep your urine pale yellow.  Return to your normal activities as told by your health care provider. Ask your  health care provider what activities are safe for you. This information is not intended to replace advice given to you by your  health care provider. Make sure you discuss any questions you have with your health care provider. Document Revised: 01/13/2020 Document Reviewed: 08/12/2019 Elsevier Patient Education  2021 [employer].

## 2020-06-20 ENCOUNTER — Other Ambulatory Visit: Payer: Self-pay

## 2020-06-20 ENCOUNTER — Encounter (HOSPITAL_COMMUNITY): Payer: Self-pay

## 2020-06-20 ENCOUNTER — Encounter (HOSPITAL_COMMUNITY)
Admission: RE | Admit: 2020-06-20 | Discharge: 2020-06-20 | Disposition: A | Discharge status: Home / Self Care | Payer: Medicare HMO | Source: Ambulatory Visit | Attending: Vascular Surgery | Admitting: Vascular Surgery

## 2020-06-20 ENCOUNTER — Other Ambulatory Visit (HOSPITAL_COMMUNITY)
Admission: RE | Admit: 2020-06-20 | Discharge: 2020-06-20 | Disposition: A | Discharge status: Home / Self Care | Payer: Medicare HMO | Source: Ambulatory Visit | Attending: Vascular Surgery | Admitting: Vascular Surgery

## 2020-06-20 DIAGNOSIS — Z20822 Contact with and (suspected) exposure to covid-19: Secondary | ICD-10-CM | POA: Diagnosis not present

## 2020-06-20 DIAGNOSIS — Z01818 Encounter for other preprocedural examination: Secondary | ICD-10-CM | POA: Diagnosis not present

## 2020-06-20 LAB — CBC WITH DIFFERENTIAL/PLATELET
Abs Immature Granulocytes: 0.01 10*3/uL (ref 0.00–0.07)
Basophils Absolute: 0 10*3/uL (ref 0.0–0.1)
Basophils Relative: 1 %
Eosinophils Absolute: 0.2 10*3/uL (ref 0.0–0.5)
Eosinophils Relative: 5 %
HCT: 36.5 % (ref 36.0–46.0)
Hemoglobin: 11.5 g/dL — ABNORMAL LOW (ref 12.0–15.0)
Immature Granulocytes: 0 %
Lymphocytes Relative: 22 %
Lymphs Abs: 1.1 10*3/uL (ref 0.7–4.0)
MCH: 32.9 pg (ref 26.0–34.0)
MCHC: 31.5 g/dL (ref 30.0–36.0)
MCV: 104.3 fL — ABNORMAL HIGH (ref 80.0–100.0)
Monocytes Absolute: 0.5 10*3/uL (ref 0.1–1.0)
Monocytes Relative: 11 %
Neutro Abs: 3.2 10*3/uL (ref 1.7–7.7)
Neutrophils Relative %: 61 %
Platelets: 307 10*3/uL (ref 150–400)
RBC: 3.5 MIL/uL — ABNORMAL LOW (ref 3.87–5.11)
RDW: 14.2 % (ref 11.5–15.5)
WBC: 5.1 10*3/uL (ref 4.0–10.5)
nRBC: 0 % (ref 0.0–0.2)

## 2020-06-20 LAB — BASIC METABOLIC PANEL
Anion gap: 8 (ref 5–15)
BUN: 67 mg/dL — ABNORMAL HIGH (ref 8–23)
CO2: 20 mmol/L — ABNORMAL LOW (ref 22–32)
Calcium: 9.2 mg/dL (ref 8.9–10.3)
Chloride: 107 mmol/L (ref 98–111)
Creatinine, Ser: 2.42 mg/dL — ABNORMAL HIGH (ref 0.44–1.00)
GFR, Estimated: 20 mL/min — ABNORMAL LOW (ref >60)
Glucose, Bld: 100 mg/dL — ABNORMAL HIGH (ref 70–99)
Potassium: 5.3 mmol/L — ABNORMAL HIGH (ref 3.5–5.1)
Sodium: 135 mmol/L (ref 135–145)

## 2020-06-21 LAB — SARS CORONAVIRUS 2 (TAT 6-24 HRS): SARS Coronavirus 2: NEGATIVE

## 2020-06-22 ENCOUNTER — Ambulatory Visit (HOSPITAL_COMMUNITY): Payer: Medicare HMO | Admitting: Certified Registered Nurse Anesthetist

## 2020-06-22 ENCOUNTER — Ambulatory Visit (HOSPITAL_COMMUNITY)
Admission: RE | Admit: 2020-06-22 | Discharge: 2020-06-22 | Disposition: A | Discharge status: Home / Self Care | Payer: Medicare HMO | Attending: Vascular Surgery | Admitting: Vascular Surgery

## 2020-06-22 ENCOUNTER — Encounter (HOSPITAL_COMMUNITY): Payer: Self-pay | Admitting: Vascular Surgery

## 2020-06-22 ENCOUNTER — Encounter (HOSPITAL_COMMUNITY)
Admission: RE | Disposition: A | Discharge status: Home / Self Care | Payer: Self-pay | Source: Home / Self Care | Attending: Vascular Surgery

## 2020-06-22 DIAGNOSIS — I129 Hypertensive chronic kidney disease with stage 1 through stage 4 chronic kidney disease, or unspecified chronic kidney disease: Secondary | ICD-10-CM | POA: Diagnosis not present

## 2020-06-22 DIAGNOSIS — Z79890 Hormone replacement therapy (postmenopausal): Secondary | ICD-10-CM | POA: Diagnosis not present

## 2020-06-22 DIAGNOSIS — N184 Chronic kidney disease, stage 4 (severe): Secondary | ICD-10-CM | POA: Insufficient documentation

## 2020-06-22 DIAGNOSIS — N185 Chronic kidney disease, stage 5: Secondary | ICD-10-CM | POA: Diagnosis not present

## 2020-06-22 DIAGNOSIS — N186 End stage renal disease: Secondary | ICD-10-CM | POA: Diagnosis not present

## 2020-06-22 DIAGNOSIS — I120 Hypertensive chronic kidney disease with stage 5 chronic kidney disease or end stage renal disease: Secondary | ICD-10-CM | POA: Diagnosis not present

## 2020-06-22 DIAGNOSIS — Z79899 Other long term (current) drug therapy: Secondary | ICD-10-CM | POA: Insufficient documentation

## 2020-06-22 HISTORY — PX: BASCILIC VEIN TRANSPOSITION: SHX5742

## 2020-06-22 LAB — POCT I-STAT, CHEM 8
BUN: 57 mg/dL — ABNORMAL HIGH (ref 8–23)
Calcium, Ion: 1.21 mmol/L (ref 1.15–1.40)
Chloride: 110 mmol/L (ref 98–111)
Creatinine, Ser: 2.4 mg/dL — ABNORMAL HIGH (ref 0.44–1.00)
Glucose, Bld: 90 mg/dL (ref 70–99)
HCT: 33 % — ABNORMAL LOW (ref 36.0–46.0)
Hemoglobin: 11.2 g/dL — ABNORMAL LOW (ref 12.0–15.0)
Potassium: 4 mmol/L (ref 3.5–5.1)
Sodium: 141 mmol/L (ref 135–145)
TCO2: 21 mmol/L — ABNORMAL LOW (ref 22–32)

## 2020-06-22 SURGERY — TRANSPOSITION, VEIN, BASILIC
Anesthesia: General | Site: Arm Upper | Laterality: Left

## 2020-06-22 MED ORDER — LIDOCAINE-EPINEPHRINE 0.5 %-1:200000 IJ SOLN
INTRAMUSCULAR | Status: DC | PRN
  Administered 2020-06-22: 5 mL

## 2020-06-22 MED ORDER — FENTANYL CITRATE (PF) 100 MCG/2ML IJ SOLN
INTRAMUSCULAR | Status: DC | PRN
  Administered 2020-06-22 (×2): 50 ug via INTRAVENOUS

## 2020-06-22 MED ORDER — ORAL CARE MOUTH RINSE: 15.0000 mL | Freq: Once | OROMUCOSAL | Status: AC

## 2020-06-22 MED ORDER — ONDANSETRON HCL 4 MG/2ML IJ SOLN
INTRAMUSCULAR | Status: AC
  Filled 2020-06-22: qty 2

## 2020-06-22 MED ORDER — CHLORHEXIDINE GLUCONATE 4 % EX LIQD: 60.0000 mL | Freq: Once | CUTANEOUS | Status: DC

## 2020-06-22 MED ORDER — PROPOFOL 10 MG/ML IV BOLUS
INTRAVENOUS | Status: AC
  Filled 2020-06-22: qty 40

## 2020-06-22 MED ORDER — SODIUM CHLORIDE 0.9 % IV SOLN
INTRAVENOUS | Status: DC
  Administered 2020-06-22: 500 mL via INTRAVENOUS

## 2020-06-22 MED ORDER — PROPOFOL 500 MG/50ML IV EMUL
INTRAVENOUS | Status: DC | PRN
  Administered 2020-06-22: 50 ug/kg/min via INTRAVENOUS

## 2020-06-22 MED ORDER — SODIUM CHLORIDE 0.9 % IV SOLN
INTRAVENOUS | Status: DC | PRN
  Administered 2020-06-22: 08:00:00 20 mL

## 2020-06-22 MED ORDER — LIDOCAINE-EPINEPHRINE 0.5 %-1:200000 IJ SOLN
INTRAMUSCULAR | Status: AC
  Filled 2020-06-22: qty 1

## 2020-06-22 MED ORDER — LIDOCAINE 2% (20 MG/ML) 5 ML SYRINGE
INTRAMUSCULAR | Status: DC | PRN
  Administered 2020-06-22: 50 mg via INTRAVENOUS

## 2020-06-22 MED ORDER — FENTANYL CITRATE (PF) 100 MCG/2ML IJ SOLN
INTRAMUSCULAR | Status: AC
  Filled 2020-06-22: qty 2

## 2020-06-22 MED ORDER — OXYCODONE-ACETAMINOPHEN 5-325 MG PO TABS: 1.0000 | ORAL_TABLET | Freq: Four times a day (QID) | ORAL | 0 refills | Status: DC | PRN

## 2020-06-22 MED ORDER — DEXAMETHASONE SODIUM PHOSPHATE 4 MG/ML IJ SOLN
INTRAMUSCULAR | Status: DC | PRN
  Administered 2020-06-22: 4 mg via INTRAVENOUS

## 2020-06-22 MED ORDER — DIPHENHYDRAMINE HCL 50 MG/ML IJ SOLN
INTRAMUSCULAR | Status: DC | PRN
  Administered 2020-06-22: 12.5 mg via INTRAVENOUS

## 2020-06-22 MED ORDER — 0.9 % SODIUM CHLORIDE (POUR BTL) OPTIME
TOPICAL | Status: DC | PRN
  Administered 2020-06-22: 1000 mL

## 2020-06-22 MED ORDER — LIDOCAINE HCL (PF) 2 % IJ SOLN
INTRAMUSCULAR | Status: AC
  Filled 2020-06-22: qty 5

## 2020-06-22 MED ORDER — DEXAMETHASONE SODIUM PHOSPHATE 10 MG/ML IJ SOLN
INTRAMUSCULAR | Status: AC
  Filled 2020-06-22: qty 1

## 2020-06-22 MED ORDER — LACTATED RINGERS IV SOLN
INTRAVENOUS | Status: DC
  Administered 2020-06-22: 500 mL via INTRAVENOUS

## 2020-06-22 MED ORDER — PROPOFOL 10 MG/ML IV BOLUS
INTRAVENOUS | Status: DC | PRN
  Administered 2020-06-22 (×3): 20 mg via INTRAVENOUS

## 2020-06-22 MED ORDER — ONDANSETRON HCL 4 MG/2ML IJ SOLN
INTRAMUSCULAR | Status: DC | PRN
  Administered 2020-06-22: 4 mg via INTRAVENOUS

## 2020-06-22 MED ORDER — HEPARIN SODIUM (PORCINE) 1000 UNIT/ML IJ SOLN
INTRAMUSCULAR | Status: AC
  Filled 2020-06-22: qty 6

## 2020-06-22 MED ORDER — CEFAZOLIN SODIUM-DEXTROSE 2-4 GM/100ML-% IV SOLN
2.0000 g | INTRAVENOUS | Status: AC
  Administered 2020-06-22: 2 g via INTRAVENOUS
  Filled 2020-06-22: qty 100

## 2020-06-22 MED ORDER — PHENYLEPHRINE 40 MCG/ML (10ML) SYRINGE FOR IV PUSH (FOR BLOOD PRESSURE SUPPORT)
PREFILLED_SYRINGE | INTRAVENOUS | Status: AC
  Filled 2020-06-22: qty 10

## 2020-06-22 MED ORDER — DIPHENHYDRAMINE HCL 50 MG/ML IJ SOLN
INTRAMUSCULAR | Status: AC
  Filled 2020-06-22: qty 1

## 2020-06-22 MED ORDER — CHLORHEXIDINE GLUCONATE 0.12 % MT SOLN
15.0000 mL | Freq: Once | OROMUCOSAL | Status: AC
  Administered 2020-06-22: 15 mL via OROMUCOSAL

## 2020-06-22 SURGICAL SUPPLY — 38 items
ADH SKN CLS APL DERMABOND .7 (GAUZE/BANDAGES/DRESSINGS) ×1
ARMBAND PINK RESTRICT EXTREMIT (MISCELLANEOUS) ×3 IMPLANT
BAG HAMPER (MISCELLANEOUS) ×6 IMPLANT
BNDG ELASTIC 4X5.8 VLCR STR LF (GAUZE/BANDAGES/DRESSINGS) ×3 IMPLANT
CANNULA VESSEL 3MM 2 BLNT TIP (CANNULA) ×3 IMPLANT
CLIP LIGATING EXTRA MED SLVR (CLIP) ×3 IMPLANT
CLIP LIGATING EXTRA SM BLUE (MISCELLANEOUS) ×3 IMPLANT
COVER LIGHT HANDLE STERIS (MISCELLANEOUS) ×3 IMPLANT
COVER MAYO STAND XLG (MISCELLANEOUS) ×3 IMPLANT
COVER PROBE W GEL 5X96 (DRAPES) ×3 IMPLANT
COVER WAND RF STERILE (DRAPES) ×3 IMPLANT
DECANTER SPIKE VIAL GLASS SM (MISCELLANEOUS) ×3 IMPLANT
DERMABOND ADVANCED (GAUZE/BANDAGES/DRESSINGS) ×2
DERMABOND ADVANCED .7 DNX12 (GAUZE/BANDAGES/DRESSINGS) ×1 IMPLANT
ELECT REM PT RETURN 9FT ADLT (ELECTROSURGICAL) ×3
ELECTRODE REM PT RTRN 9FT ADLT (ELECTROSURGICAL) ×1 IMPLANT
GAUZE SPONGE 4X4 12PLY STRL (GAUZE/BANDAGES/DRESSINGS) ×3 IMPLANT
GLOVE SS BIOGEL STRL SZ 7.5 (GLOVE) ×1 IMPLANT
GLOVE SUPERSENSE BIOGEL SZ 7.5 (GLOVE) ×2
GLOVE SURG UNDER POLY LF SZ7 (GLOVE) ×9 IMPLANT
GOWN STRL REUS W/TWL LRG LVL3 (GOWN DISPOSABLE) ×9 IMPLANT
KIT BLADEGUARD II DBL (SET/KITS/TRAYS/PACK) ×3 IMPLANT
KIT TURNOVER KIT A (KITS) ×3 IMPLANT
MANIFOLD NEPTUNE II (INSTRUMENTS) ×6 IMPLANT
MARKER SKIN DUAL TIP RULER LAB (MISCELLANEOUS) ×6 IMPLANT
NEEDLE HYPO 18GX1.5 BLUNT FILL (NEEDLE) ×3 IMPLANT
NS IRRIG 1000ML POUR BTL (IV SOLUTION) ×3 IMPLANT
PACK CV ACCESS (CUSTOM PROCEDURE TRAY) ×3 IMPLANT
PAD ARMBOARD 7.5X6 YLW CONV (MISCELLANEOUS) ×3 IMPLANT
SET BASIN LINEN APH (SET/KITS/TRAYS/PACK) ×3 IMPLANT
SOL PREP PROV IODINE SCRUB 4OZ (MISCELLANEOUS) ×3 IMPLANT
SUT PROLENE 6 0 CC (SUTURE) ×3 IMPLANT
SUT SILK 2 0 SH (SUTURE) IMPLANT
SUT VIC AB 3-0 SH 27 (SUTURE) ×3
SUT VIC AB 3-0 SH 27X BRD (SUTURE) ×1 IMPLANT
SYR 20ML LL LF (SYRINGE) ×3 IMPLANT
SYR CONTROL 10ML LL (SYRINGE) ×3 IMPLANT
WATER STERILE IRR 1000ML POUR (IV SOLUTION) ×3 IMPLANT

## 2020-06-22 NOTE — Op Note (Signed)
    OPERATIVE REPORT  DATE OF SURGERY: 06/22/2020  PATIENT: Sandra Hunter, 80 y.o. female MRN: [id number]  DOB: [birth year]  PRE-OPERATIVE DIAGNOSIS: Chronic renal insufficiency  POST-OPERATIVE DIAGNOSIS:  Same  PROCEDURE: Left first stage brachiobasilic fistula  SURGEON:  Curt Jews, M.D.  PHYSICIAN ASSISTANT: Nurse  The assistant was needed for exposure and to expedite the case  ANESTHESIA: Local with sedation  EBL: per anesthesia record  Total I/O In: 100 [IV Piggyback:100] Out: -   BLOOD ADMINISTERED: none  DRAINS: none  SPECIMEN: none  COUNTS CORRECT:  YES  PATIENT DISPOSITION:  PACU - hemodynamically stable  PROCEDURE DETAILS: Patient was taken operating placed to position area of the left arm prepped draped you sterile fashion.  SonoSite ultrasound was used to visualize the basilic vein which was of moderate size.  Patient had extreme tortuosity of the brachial artery.  Using local anesthesia incision was made over the antecubital space and carried down to isolate the brachial artery which made a complete S in the antecubital space.  The basilic vein was also identified and was of adequate size for fistula creation.  Tributary branches were ligated with 4-0 silk ties and divided.  The vein was ligated distally and divided and mobilized to the level of the brachial artery.  The brachial artery was occluded in the upper portion of the incision above the extreme tortuosity.  The artery was occluded proximally distally and was opened with 11 blade and sent longitudinally with Potts scissors.  The vein was cut to the appropriate length and was sewn end-to-side to the artery with a running 6-0 Prolene suture.  Clamps were removed and a good thrill was noted.  The patient did have palpable radial pulse but was diminished with the fistula patent.  The wounds were irrigated with saline.  Hemostasis was obtained with electrocautery.  The wounds were closed with 3-0 Vicryl in  the subcutaneous and subcuticular tissue sterile dressing was applied and the patient was transferred to the recovery room in stable condition   Rosetta Posner, M.D., [facility] 06/22/2020 8:36 AM  Note: Portions of this report may have been transcribed using voice recognition software.  Every effort has been made to ensure accuracy; however, inadvertent computerized transcription errors may still be present.

## 2020-06-22 NOTE — Anesthesia Preprocedure Evaluation (Signed)
Anesthesia Evaluation  Patient identified by MRN, date of birth, ID band Patient awake    Reviewed: Allergy & Precautions, H&P , NPO status , Patient's Chart, lab work & pertinent test results, reviewed documented beta blocker date and time   Airway Mallampati: II  TM Distance: >3 FB Neck ROM: full    Dental no notable dental hx.    Pulmonary neg pulmonary ROS,    Pulmonary exam normal breath sounds clear to auscultation       Cardiovascular Exercise Tolerance: Good hypertension, negative cardio ROS   Rhythm:regular Rate:Normal     Neuro/Psych negative neurological ROS  negative psych ROS   GI/Hepatic negative GI ROS, Neg liver ROS,   Endo/Other  Hypothyroidism   Renal/GU negative Renal ROS  negative genitourinary   Musculoskeletal   Abdominal   Peds  Hematology negative hematology ROS (+)   Anesthesia Other Findings   Reproductive/Obstetrics negative OB ROS                             Anesthesia Physical Anesthesia Plan  ASA: III  Anesthesia Plan: General   Post-op Pain Management:    Induction:   PONV Risk Score and Plan: Propofol infusion  Airway Management Planned:   Additional Equipment:   Intra-op Plan:   Post-operative Plan:   Informed Consent: I have reviewed the patients History and Physical, chart, labs and discussed the procedure including the risks, benefits and alternatives for the proposed anesthesia with the patient or authorized representative who has indicated his/her understanding and acceptance.     Dental Advisory Given  Plan Discussed with: CRNA  Anesthesia Plan Comments:         Anesthesia Quick Evaluation

## 2020-06-22 NOTE — H&P (Signed)
Office Visit  06/05/2020 [facility]   Shauntell Iglesia, Arvilla Meres, MD   Vascular Surgery  CKD (chronic kidney disease) stage 4, GFR 15-29 ml/min [facility])   Dx  New Patient (Initial Visit); Referred by [name], MD   Reason for Visit    Additional Documentation  Vitals:  BP 144/89Important    Pulse 75   Temp 97 F (36.1 C)Important (Other (Comment))   Resp 14   Ht 5' 3.5" (1.613 m)   Wt 61.7 kg   SpO2 98%   BMI 23.71 kg/m   BSA 1.66 m       More Vitals   Flowsheets:  Anthropometrics,   Infectious Disease Screening,   Vital Signs,   NEWS,   MEWS Score,   Method of Visit     Encounter Info:  Billing Info,   History,   Allergies,   Detailed Report      All Notes   Progress Notes by Rosetta Posner, MD at 06/05/2020 11:10 AM  Author: Rosetta Posner, MD Author Type: Physician Filed: 06/05/2020 12:40 PM  Note Status: Signed Cosign: Cosign Not Required Encounter Date: 06/05/2020  Editor: Rosetta Posner, MD (Physician)                   [facility] [place]  Patient name: Sandra Hunter            MRN: [id number]        DOB: 10-08-1940        Sex: female  REASON FOR CONSULT: Discuss access for hemodialysis  Nephrologist: [place]  HPI: Sandra Hunter is a 80 y.o. female, here today for discussion of hemodialysis access options.  She is not on hemodialysis.  He is listed as stage IV.  She is right-handed.  She has no history of pacemaker placement.  Her renal insufficiency is based on hypertension.  Past Medical History:  Diagnosis Date  . Arthritis    fingers  . CKD (chronic kidney disease)   . CKD (chronic kidney disease) stage 4, GFR 15-29 ml/min (HCC) 04/06/2018  . Hypertension    [name]  . Hypothyroidism   . Mitral regurgitation          Family History  Problem Relation Age of Onset  . Stroke Mother     SOCIAL HISTORY: Social History         Socioeconomic History  . Marital status: Married    Spouse name: Not on file  . Number of children: 2  . Years of education: Not on file  . Highest education level: Not on file  Occupational History  . Occupation: Insurance claims handler: RETIRED  Tobacco Use  . Smoking status: Never Smoker  . Smokeless tobacco: Never Used  Vaping Use  . Vaping Use: Never used  Substance and Sexual Activity  . Alcohol use: Yes    Alcohol/week: 21.0 standard drinks    Types: 21 Glasses of wine per week    Comment: 3 glasses of wine daily  . Drug use: No  . Sexual activity: Not on file  Other Topics Concern  . Not on file  Social History Narrative   WAlks for exercise   Associate degree   5 cups coffee daily   Social Determinants of Health   Financial Resource Strain: Not on file  Food Insecurity: Not on file  Transportation Needs: Not on file  Physical Activity: Not on file  Stress:  Not on file  Social Connections: Not on file  Intimate Partner Violence: Not on file    No Known Allergies        Current Outpatient Medications  Medication Sig Dispense Refill  . amLODipine (NORVASC) 10 MG tablet Take 10 mg by mouth daily.  5  . calcitRIOL (ROCALTROL) 0.25 MCG capsule Take 0.25 mcg by mouth daily.    Marland Kitchen escitalopram (LEXAPRO) 10 MG tablet     . furosemide (LASIX) 20 MG tablet Take 20 mg by mouth daily.    Marland Kitchen levothyroxine (SYNTHROID) 50 MCG tablet Take 50 mcg by mouth daily before breakfast.    . metoprolol succinate (TOPROL-XL) 25 MG 24 hr tablet      No current facility-administered medications for this visit.    REVIEW OF SYSTEMS:  '[X]'$  denotes positive finding, '[ ]'$  denotes negative finding Cardiac  Comments:  Chest pain or chest pressure:    Shortness of breath upon exertion: x   Short of breath when lying flat:    Irregular heart rhythm:        Vascular    Pain in calf, thigh, or hip brought on by ambulation: x    Pain in feet at night that wakes you up from your sleep:     Blood clot in your veins:    Leg swelling:         Pulmonary    Oxygen at home:    Productive cough:     Wheezing:         Neurologic    Sudden weakness in arms or legs:     Sudden numbness in arms or legs:     Sudden onset of difficulty speaking or slurred speech:    Temporary loss of vision in one eye:     Problems with dizziness:         Gastrointestinal    Blood in stool:     Vomited blood:         Genitourinary    Burning when urinating:     Blood in urine:        Psychiatric    Major depression:         Hematologic    Bleeding problems:    Problems with blood clotting too easily:        Skin    Rashes or ulcers:        Constitutional    Fever or chills:      PHYSICAL EXAM:    Vitals:   06/05/20 1103  BP: (!) 144/89  Pulse: 75  Resp: 14  Temp: (!) 97 F (36.1 C)  TempSrc: Other (Comment)  SpO2: 98%  Weight: 136 lb (61.7 kg)  Height: 5' 3.5" (1.613 m)    GENERAL: The patient is a well-nourished female, in no acute distress. The vital signs are documented above. VASCULAR: 2+ radial pulses bilaterally.  She does have a visible basilic vein at the left antecubital space.  Otherwise cephalic and basilic veins are not visualized. PULMONARY: There is good air exchange MUSCULOSKELETAL: There are no major deformities or cyanosis. NEUROLOGIC: No focal weakness or paresthesias are detected. SKIN: There are no ulcers or rashes noted. PSYCHIATRIC: The patient has a normal affect.  DATA:   Vein map from [facility] on 04/27/2020 was reviewed.  This reveals relatively small surface veins bilaterally.  Her cephalic vein is an adequate for access bilaterally.  She does have moderate sized left basilic vein  I imaged her basilic  vein with SonoSite and this does appear to be adequate for fistula  attempt.  MEDICAL ISSUES:  I had a long discussion with the patient regarding access for hemodialysis.  I discussed options to include tunneled hemodialysis catheter for urgent access and also AV graft and AV fistula.  Discussed the benefits and disadvantages of each of these.  I do feel that she is a candidate for for stage left basilic vein fistula.  Explained this would be done as an outpatient.  Also explained that she would then be seen in the office at 4 to 6 weeks and if she had had nice maturation would plan a second stage transposition.  She has an appointment with Dr. Marval Regal tomorrow.  She will discuss this further with him and plan outpatient surgery if all are in agreement   Rosetta Posner, MD [facility] [place] Office phone [phone]       Addendum:  The patient has been re-examined and re-evaluated.  The patient's history and physical has been reviewed and is unchanged.    LEKIA ZIEGENHORN is a 80 y.o. female is being admitted with ESRD. All the risks, benefits and other treatment options have been discussed with the patient. The patient has consented to proceed with Procedure(s): LEFT FIRST STAGE [place] as a surgical intervention.  Curt Jews 06/22/2020 7:27 AM Vascular and Vein Surgery

## 2020-06-22 NOTE — Anesthesia Procedure Notes (Signed)
Date/Time: 06/22/2020 7:42 AM Performed by: Orlie Dakin, CRNA Pre-anesthesia Checklist: Patient identified, Emergency Drugs available, Suction available and Patient being monitored Patient Re-evaluated:Patient Re-evaluated prior to induction Oxygen Delivery Method: Nasal cannula Induction Type: IV induction Placement Confirmation: positive ETCO2

## 2020-06-22 NOTE — Transfer of Care (Signed)
Immediate Anesthesia Transfer of Care Note  Patient: Sandra Hunter  Procedure(s) Performed: LEFT FIRST STAGE BASCILIC VEIN TRANSPOSITION (Left Arm Upper)  Patient Location: PACU  Anesthesia Type:General  Level of Consciousness: awake, alert  and oriented  Airway & Oxygen Therapy: Patient Spontanous Breathing  Post-op Assessment: Report given to RN and Post -op Vital signs reviewed and stable  Post vital signs: Reviewed and stable  Last Vitals:  Vitals Value Taken Time  BP 173/86 06/22/20 0838  Temp    Pulse    Resp 16 06/22/20 0840  SpO2    Vitals shown include unvalidated device data.  Last Pain:  Vitals:   06/22/20 0659  TempSrc: Oral  PainSc: 0-No pain      Patients Stated Pain Goal: 5 ([id number])  Complications: No complications documented.

## 2020-06-22 NOTE — Anesthesia Postprocedure Evaluation (Signed)
Anesthesia Post Note  Patient: Sandra Hunter  Procedure(s) Performed: LEFT FIRST STAGE BASCILIC VEIN TRANSPOSITION (Left Arm Upper)  Patient location during evaluation: PACU Anesthesia Type: General Level of consciousness: awake and alert and oriented Pain management: pain level controlled Vital Signs Assessment: post-procedure vital signs reviewed and stable Respiratory status: spontaneous breathing, nonlabored ventilation and respiratory function stable Cardiovascular status: blood pressure returned to baseline and stable Postop Assessment: no apparent nausea or vomiting Anesthetic complications: no   No complications documented.   Last Vitals:  Vitals:   06/22/20 0845 06/22/20 0900  BP: (!) 174/78 (!) 172/84  Pulse: 62 61  Resp: 15 11  Temp:    SpO2: 99% 100%    Last Pain:  Vitals:   06/22/20 0845  TempSrc:   PainSc: 0-No pain                 Orlie Dakin

## 2020-06-22 NOTE — Discharge Instructions (Signed)
[facility] of [facility]  Discharge Instructions  AV Fistula or Graft Surgery for Dialysis Access  Please refer to the following instructions for your post-procedure care. Your surgeon or physician assistant will discuss any changes with you.  Activity  You may drive the day following your surgery, if you are comfortable and no longer taking prescription pain medication. Resume full activity as the soreness in your incision resolves.  Bathing/Showering  You may shower after you go home. Keep your incision dry for 48 hours. Do not soak in a bathtub, hot tub, or swim until the incision heals completely. You may not shower if you have a hemodialysis catheter.  Incision Care  Clean your incision with mild soap and water after 48 hours. Pat the area dry with a clean towel. You do not need a bandage unless otherwise instructed. Do not apply any ointments or creams to your incision. You may have skin glue on your incision. Do not peel it off. It will come off on its own in about one week. Your arm may swell a bit after surgery. To reduce swelling use pillows to elevate your arm so it is above your heart. Your doctor will tell you if you need to lightly wrap your arm with an ACE bandage.  Diet  Resume your normal diet. There are not special food restrictions following this procedure. In order to heal from your surgery, it is CRITICAL to get adequate nutrition. Your body requires vitamins, minerals, and protein. Vegetables are the best source of vitamins and minerals. Vegetables also provide the perfect balance of protein. Processed food has little nutritional value, so try to avoid this.  Medications  Resume taking all of your medications. If your incision is causing pain, you may take over-the counter pain relievers such as acetaminophen (Tylenol). If you were prescribed a stronger pain medication, please be aware these medications can cause nausea and constipation. Prevent  nausea by taking the medication with a snack or meal. Avoid constipation by drinking plenty of fluids and eating foods with high amount of fiber, such as fruits, vegetables, and grains.  Do not take Tylenol if you are taking prescription pain medications.  Follow up Your surgeon may want to see you in the office following your access surgery. If so, this will be arranged at the time of your surgery.  Please call us immediately for any of the following conditions:   Increased pain, redness, drainage (pus) from your incision site  Fever of 101 degrees or higher  Severe or worsening pain at your incision site  Hand pain or numbness.   Reduce your risk of vascular disease:   Stop smoking. If you would like help, call [facility] at 1-800-QUIT-NOW 3377935802) or [place] at [place] your cholesterol  Maintain a desired weight  Control your diabetes  Keep your blood pressure down  Dialysis  It will take several weeks to several months for your new dialysis access to be ready for use. Your surgeon will determine when it is okay to use it. Your nephrologist will continue to direct your dialysis. You can continue to use your Permcath until your new access is ready for use.   06/22/2020 Sandra Hunter [id number] 05-27-1940  Surgeon(s): Early, Arvilla Meres, MD  Procedure(s): LEFT FIRST STAGE BASCILIC VEIN TRANSPOSITION   May stick graft immediately   May stick graft on designated area only:    Do not stick fistula for 12 weeks    If  you have any questions, please call the office at [phone].       General Anesthesia, Adult, Care After This sheet gives you information about how to care for yourself after your procedure. Your health care provider may also give you more specific instructions. If you have problems or questions, contact your health care provider. What can I expect after the procedure? After the procedure, the following side effects  are common:  Pain or discomfort at the IV site.  Nausea.  Vomiting.  Sore throat.  Trouble concentrating.  Feeling cold or chills.  Feeling weak or tired.  Sleepiness and fatigue.  Soreness and body aches. These side effects can affect parts of the body that were not involved in surgery. Follow these instructions at home: For the time period you were told by your health care provider:  Rest.  Do not participate in activities where you could fall or become injured.  Do not drive or use machinery.  Do not drink alcohol.  Do not take sleeping pills or medicines that cause drowsiness.  Do not make important decisions or sign legal documents.  Do not take care of children on your own.   Eating and drinking  Follow any instructions from your health care provider about eating or drinking restrictions.  When you feel hungry, start by eating small amounts of foods that are soft and easy to digest (bland), such as toast. Gradually return to your regular diet.  Drink enough fluid to keep your urine pale yellow.  If you vomit, rehydrate by drinking water, juice, or clear broth. General instructions  If you have sleep apnea, surgery and certain medicines can increase your risk for breathing problems. Follow instructions from your health care provider about wearing your sleep device: ? Anytime you are sleeping, including during daytime naps. ? While taking prescription pain medicines, sleeping medicines, or medicines that make you drowsy.  Have a responsible adult stay with you for the time you are told. It is important to have someone help care for you until you are awake and alert.  Return to your normal activities as told by your health care provider. Ask your health care provider what activities are safe for you.  Take over-the-counter and prescription medicines only as told by your health care provider.  If you smoke, do not smoke without supervision.  Keep all  follow-up visits as told by your health care provider. This is important. Contact a health care provider if:  You have nausea or vomiting that does not get better with medicine.  You cannot eat or drink without vomiting.  You have pain that does not get better with medicine.  You are unable to pass urine.  You develop a skin rash.  You have a fever.  You have redness around your IV site that gets worse. Get help right away if:  You have difficulty breathing.  You have chest pain.  You have blood in your urine or stool, or you vomit blood. Summary  After the procedure, it is common to have a sore throat or nausea. It is also common to feel tired.  Have a responsible adult stay with you for the time you are told. It is important to have someone help care for you until you are awake and alert.  When you feel hungry, start by eating small amounts of foods that are soft and easy to digest (bland), such as toast. Gradually return to your regular diet.  Drink enough fluid to keep  your urine pale yellow.  Return to your normal activities as told by your health care provider. Ask your health care provider what activities are safe for you. This information is not intended to replace advice given to you by your health care provider. Make sure you discuss any questions you have with your health care provider. Document Revised: 01/13/2020 Document Reviewed: 08/12/2019 Elsevier Patient Education  2021 [employer].     Acetaminophen; Oxycodone tablets What is this medicine? ACETAMINOPHEN; OXYCODONE (a set a MEE noe fen; ox i KOE done) is a pain reliever. It is used to treat moderate to severe pain. This medicine may be used for other purposes; ask your health care provider or pharmacist if you have questions. COMMON BRAND NAME(S): Endocet, Magnacet, Nalocet, Narvox, Percocet, Perloxx, Primalev, Primlev, Prolate, Roxicet, Xolox What should I tell my health care provider before I take  this medicine? They need to know if you have any of these conditions:  brain tumor  drug abuse or addiction  head injury  heart disease  if you often drink alcohol   kidney disease   liver disease  low adrenal gland function  lung disease, asthma, or breathing problem  seizures  stomach or intestine problems  taken an MAOI like Marplan, Nardil, or Parnate in the last 14 days  an unusual or allergic reaction to acetaminophen, oxycodone, other medicines, foods, dyes, or preservative  pregnant or trying to get pregnant  breast-feeding How should I use this medicine? Take this medicine by mouth with a full glass of water. Take it as directed on the label. You can take it with or without food. If it upsets your stomach, take it with food. Do not use it more often than directed. There may be unused or extra doses in the bottle after you finish your treatment. Talk to your health care provider if you have questions about your dose. A special MedGuide will be given to you by the pharmacist with each prescription and refill. Be sure to read this information carefully each time. Talk to your health care provider about the use of this medicine in children. Special care may be needed. Patients over 87 years of age may have a stronger reaction and need a smaller dose. Overdosage: If you think you have taken too much of this medicine contact a poison control center or emergency room at once. NOTE: This medicine is only for you. Do not share this medicine with others. What if I miss a dose? This does not apply. This medicine is not for regular use. It should only be used as needed. What may interact with this medicine? This medicine may interact with the following medications:  alcohol  antihistamines for allergy, cough and cold  antiviral medicines for HIV or AIDS  atropine  certain antibiotics like clarithromycin, erythromycin, linezolid, rifampin  certain medicines for  anxiety or sleep  certain medicines for bladder problems like oxybutynin, tolterodine  certain medicines for depression like amitriptyline, fluoxetine, sertraline  certain medicines for fungal infections like ketoconazole, itraconazole, voriconazole  certain medicines for migraine headache like almotriptan, eletriptan, frovatriptan, naratriptan, rizatriptan, sumatriptan, zolmitriptan  certain medicines for nausea or vomiting like dolasetron, ondansetron, palonosetron  certain medicines for Parkinson's disease like benztropine, trihexyphenidyl  certain medicines for seizures like phenobarbital, phenytoin, primidone  certain medicines for stomach problems like dicyclomine, hyoscyamine  certain medicines for travel sickness like scopolamine  diuretics  general anesthetics like halothane, isoflurane, methoxyflurane, propofol  ipratropium  local anesthetics like lidocaine, pramoxine,  tetracaine  MAOIs like Carbex, Eldepryl, Marplan, Nardil, and Parnate  medicines that relax muscles for surgery  methylene blue  nilotinib  other medicines with acetaminophen  other narcotic medicines for pain or cough  phenothiazines like chlorpromazine, mesoridazine, prochlorperazine, thioridazine This list may not describe all possible interactions. Give your health care provider a list of all the medicines, herbs, non-prescription drugs, or dietary supplements you use. Also tell them if you smoke, drink alcohol, or use illegal drugs. Some items may interact with your medicine. What should I watch for while using this medicine? Tell your health care provider if your pain does not go away, if it gets worse, or if you have new or a different type of pain. You may develop tolerance to this drug. Tolerance means that you will need a higher dose of the drug for pain relief. Tolerance is normal and is expected if you take this drug for a long time. There are different types of narcotic drugs (opioids)  for pain. If you take more than one type at the same time, you may have more side effects. Give your health care provider a list of all drugs you use. He or she will tell you how much drug to take. Do not take more drug than directed. Get emergency help right away if you have problems breathing. Do not suddenly stop taking your drug because you may develop a severe reaction. Your body becomes used to the drug. This does NOT mean you are addicted. Addiction is a behavior related to getting and using a drug for a nonmedical reason. If you have pain, you have a medical reason to take pain drug. Your health care provider will tell you how much drug to take. If your health care provider wants you to stop the drug, the dose will be slowly lowered over time to avoid any side effects. Talk to your health care provider about naloxone and how to get it. Naloxone is an emergency drug used for an opioid overdose. An overdose can happen if you take too much opioid. It can also happen if an opioid is taken with some other drugs or substances, like alcohol. Know the symptoms of an overdose, like trouble breathing, unusually tired or sleepy, or not being able to respond or wake up. Make sure to tell caregivers and close contacts where it is stored. Make sure they know how to use it. After naloxone is given, you must get emergency help right away. Naloxone is a temporary treatment. Repeat doses may be needed. Do not take other drugs that contain acetaminophen with this drug. Many non-prescription drugs contain acetaminophen. Always read labels carefully. If you have questions, ask your health care provider. If you take too much acetaminophen, get medical help right away. Too much acetaminophen can be very dangerous and cause liver damage. Even if you do not have symptoms, it is important to get help right away. This drug does not prevent a heart attack or stroke. This drug may increase the chance of a heart attack or stroke.  The chance may increase the longer you use this drug or if you have heart disease. If you take aspirin to prevent a heart attack or stroke, talk to your health care provider about using this drug. You may get drowsy or dizzy. Do not drive, use machinery, or do anything that needs mental alertness until you know how this drug affects you. Do not stand up or sit up quickly, especially if you are an older  patient. This reduces the risk of dizzy or fainting spells. Alcohol may interfere with the effect of this drug. Avoid alcoholic drinks. This drug will cause constipation. If you do not have a bowel movement for 3 days, call your health care provider. Your mouth may get dry. Chewing sugarless gum or sucking hard candy and drinking plenty of water may help. Contact your health care provider if the problem does not go away or is severe. What side effects may I notice from receiving this medicine? Side effects that you should report to your doctor or health care professional as soon as possible:  allergic reactions (skin rash, itching or hives; swelling of the face, lips, or tongue)  confusion  kidney injury (trouble passing urine or change in the amount of urine)  light-colored stool  liver injury (dark yellow or brown urine; general ill feeling or flu-like symptoms; loss of appetite, right upper belly pain; unusually weak or tired, yellowing of the eyes or skin)  low adrenal gland function (nausea; vomiting; loss of appetite; unusually weak or tired; dizziness; low blood pressure)  low blood pressure (dizziness; feeling faint or lightheaded, falls; unusually weak or tired)  redness, blistering, peeling, or loosening of the skin, including inside the mouth  serotonin syndrome (irritable; confusion; diarrhea; fast or irregular heartbeat; muscle twitching; stiff muscles; trouble walking; sweating; high fever; seizures; chills; vomiting)  trouble breathing Side effects that usually do not require  medical attention (report to your doctor or health care professional if they continue or are bothersome):  constipation  dry mouth  nausea, vomiting  tiredness This list may not describe all possible side effects. Call your doctor for medical advice about side effects. You may report side effects to FDA at 1-800-FDA-1088. Where should I keep my medicine? Keep out of the reach of children and pets. This medicine can be abused. Keep it in a safe place to protect it from theft. Do not share it with anyone. It is only for you. Selling or giving away this medicine is dangerous and against the law. Store at room temperature between 20 and 25 degrees C (68 and 77 degrees F). Protect from light. Get rid of any unused medicine after the expiration date. This medicine may cause harm and death if it is taken by other adults, children, or pets. It is important to get rid of the medicine as soon as you no longer need it or it is expired. You can do this in two ways:  Take the medicine to a medicine take-back program. Check with your pharmacy or law enforcement to find a location.  If you cannot return the medicine, flush it down the toilet. NOTE: This sheet is a summary. It may not cover all possible information. If you have questions about this medicine, talk to your doctor, pharmacist, or health care provider.  2021 Elsevier/Gold Standard (2020-01-26 11:12:15)

## 2020-06-22 NOTE — Progress Notes (Signed)
Thrill noted to Lt arm and Bruit heard with stethoscope.

## 2020-06-23 ENCOUNTER — Encounter (HOSPITAL_COMMUNITY): Payer: Self-pay | Admitting: Vascular Surgery

## 2020-06-29 DIAGNOSIS — N184 Chronic kidney disease, stage 4 (severe): Secondary | ICD-10-CM | POA: Diagnosis not present

## 2020-06-29 DIAGNOSIS — E039 Hypothyroidism, unspecified: Secondary | ICD-10-CM | POA: Diagnosis not present

## 2020-07-06 DIAGNOSIS — N184 Chronic kidney disease, stage 4 (severe): Secondary | ICD-10-CM | POA: Diagnosis not present

## 2020-07-06 DIAGNOSIS — I10 Essential (primary) hypertension: Secondary | ICD-10-CM | POA: Diagnosis not present

## 2020-07-06 DIAGNOSIS — E039 Hypothyroidism, unspecified: Secondary | ICD-10-CM | POA: Diagnosis not present

## 2020-07-06 DIAGNOSIS — F325 Major depressive disorder, single episode, in full remission: Secondary | ICD-10-CM | POA: Diagnosis not present

## 2020-07-12 DIAGNOSIS — Z0100 Encounter for examination of eyes and vision without abnormal findings: Secondary | ICD-10-CM | POA: Diagnosis not present

## 2020-07-24 ENCOUNTER — Other Ambulatory Visit: Payer: Self-pay

## 2020-07-24 ENCOUNTER — Encounter: Payer: Self-pay | Admitting: Vascular Surgery

## 2020-07-24 ENCOUNTER — Ambulatory Visit (INDEPENDENT_AMBULATORY_CARE_PROVIDER_SITE_OTHER): Payer: Medicare HMO | Admitting: Vascular Surgery

## 2020-07-24 VITALS — BP 128/76 | HR 72 | Temp 97.5°F | Resp 14 | Ht 63.5 in | Wt 133.0 lb

## 2020-07-24 DIAGNOSIS — N184 Chronic kidney disease, stage 4 (severe): Secondary | ICD-10-CM

## 2020-07-24 NOTE — H&P (View-Only) (Signed)
   [facility]ist of [place]  Patient name: SIARRA ELLINGER MRN: [id number] DOB: 02/26/1941 Sex: female  REASON FOR VISIT: Follow-up after first stage basilic vein fistula left arm  Nephrologist: Coladonato  HPI: MILA BAHLMANN is a 80 y.o. female here today for follow-up.  Underwent for stage brachiobasilic fistula at [facility] on 06/22/2020.  She did well.  Reports minimal discomfort and has no steal symptoms.  Current Outpatient Medications  Medication Sig Dispense Refill  . acetaminophen (TYLENOL) 500 MG tablet Take 1,000 mg by mouth daily.    Marland Kitchen amLODipine (NORVASC) 10 MG tablet Take 10 mg by mouth daily.  5  . calcitRIOL (ROCALTROL) 0.25 MCG capsule Take 0.25 mcg by mouth daily.    Marland Kitchen EPINEPHrine (PRIMATENE MIST) 0.125 MG/ACT AERO Inhale 1 puff into the lungs daily as needed (Wheezing and Asthma).    . escitalopram (LEXAPRO) 10 MG tablet Take 10 mg by mouth daily.    . furosemide (LASIX) 20 MG tablet Take 20 mg by mouth daily.    Marland Kitchen levothyroxine (SYNTHROID) 50 MCG tablet Take 50 mcg by mouth daily before breakfast.    . metoprolol succinate (TOPROL-XL) 25 MG 24 hr tablet Take 25 mg by mouth daily.     No current facility-administered medications for this visit.     PHYSICAL EXAM: Vitals:   07/24/20 1441  BP: 128/76  Pulse: 72  Resp: 14  Temp: (!) 97.5 F (36.4 C)  TempSrc: Other (Comment)  SpO2: 97%  Weight: 133 lb (60.3 kg)  Height: 5' 3.5" (1.613 m)    GENERAL: The patient is a well-nourished female, in no acute distress. The vital signs are documented above. Excellent thrill in left upper arm AV fistula.  Incision completely healed.  I imaged the vein with SonoSite ultrasound she does have a nicely dilated basilic vein.  MEDICAL ISSUES: Stable status post for stage basilic vein fistula.  Discussed need for second stage transposition.  She currently is not on hemodialysis.  We will do this as an  outpatient at [facility].  Would need then approximately 4 to 6 weeks healing before use of her basilic fistula she will check her schedule and plan surgery at her earliest [place]. Dalessandro Baldyga, MD FACS [facility] [place] Office Tel [phone]  Note: Portions of this report may have been transcribed using voice recognition software.  Every effort has been made to ensure accuracy; however, inadvertent computerized transcription errors may still be present.

## 2020-07-24 NOTE — Progress Notes (Signed)
   [facility] of [place]  Patient name: Sandra Hunter MRN: [id number] DOB: [birth year] Sex: female  REASON FOR VISIT: Follow-up after first stage basilic vein fistula left arm  Nephrologist: Coladonato  HPI: Sandra Hunter is a 80 y.o. female here today for follow-up.  Underwent for stage brachiobasilic fistula at [facility] on 06/22/2020.  She did well.  Reports minimal discomfort and has no steal symptoms.  Current Outpatient Medications  Medication Sig Dispense Refill  . acetaminophen (TYLENOL) 500 MG tablet Take 1,000 mg by mouth daily.    Marland Kitchen amLODipine (NORVASC) 10 MG tablet Take 10 mg by mouth daily.  5  . calcitRIOL (ROCALTROL) 0.25 MCG capsule Take 0.25 mcg by mouth daily.    Marland Kitchen EPINEPHrine (PRIMATENE MIST) 0.125 MG/ACT AERO Inhale 1 puff into the lungs daily as needed (Wheezing and Asthma).    . escitalopram (LEXAPRO) 10 MG tablet Take 10 mg by mouth daily.    . furosemide (LASIX) 20 MG tablet Take 20 mg by mouth daily.    Marland Kitchen levothyroxine (SYNTHROID) 50 MCG tablet Take 50 mcg by mouth daily before breakfast.    . metoprolol succinate (TOPROL-XL) 25 MG 24 hr tablet Take 25 mg by mouth daily.     No current facility-administered medications for this visit.     PHYSICAL EXAM: Vitals:   07/24/20 1441  BP: 128/76  Pulse: 72  Resp: 14  Temp: (!) 97.5 F (36.4 C)  TempSrc: Other (Comment)  SpO2: 97%  Weight: 133 lb (60.3 kg)  Height: 5' 3.5" (1.613 m)    GENERAL: The patient is a well-nourished female, in no acute distress. The vital signs are documented above. Excellent thrill in left upper arm AV fistula.  Incision completely healed.  I imaged the vein with SonoSite ultrasound she does have a nicely dilated basilic vein.  MEDICAL ISSUES: Stable status post for stage basilic vein fistula.  Discussed need for second stage transposition.  She currently is not on hemodialysis.  We will do this as an  outpatient at [facility].  Would need then approximately 4 to 6 weeks healing before use of her basilic fistula she will check her schedule and plan surgery at her earliest [place]. Alonte Wulff, MD FACS [facility] [place] Office Tel [phone]  Note: Portions of this report may have been transcribed using voice recognition software.  Every effort has been made to ensure accuracy; however, inadvertent computerized transcription errors may still be present.

## 2020-07-26 ENCOUNTER — Other Ambulatory Visit: Payer: Self-pay

## 2020-08-03 DIAGNOSIS — Z9114 Patient's other noncompliance with medication regimen: Secondary | ICD-10-CM | POA: Diagnosis not present

## 2020-08-03 DIAGNOSIS — E875 Hyperkalemia: Secondary | ICD-10-CM | POA: Diagnosis not present

## 2020-08-03 DIAGNOSIS — N2581 Secondary hyperparathyroidism of renal origin: Secondary | ICD-10-CM | POA: Diagnosis not present

## 2020-08-03 DIAGNOSIS — I770 Arteriovenous fistula, acquired: Secondary | ICD-10-CM | POA: Diagnosis not present

## 2020-08-03 DIAGNOSIS — I129 Hypertensive chronic kidney disease with stage 1 through stage 4 chronic kidney disease, or unspecified chronic kidney disease: Secondary | ICD-10-CM | POA: Diagnosis not present

## 2020-08-03 DIAGNOSIS — M1612 Unilateral primary osteoarthritis, left hip: Secondary | ICD-10-CM | POA: Diagnosis not present

## 2020-08-03 DIAGNOSIS — N189 Chronic kidney disease, unspecified: Secondary | ICD-10-CM | POA: Diagnosis not present

## 2020-08-03 DIAGNOSIS — D631 Anemia in chronic kidney disease: Secondary | ICD-10-CM | POA: Diagnosis not present

## 2020-08-03 DIAGNOSIS — N184 Chronic kidney disease, stage 4 (severe): Secondary | ICD-10-CM | POA: Diagnosis not present

## 2020-08-04 DIAGNOSIS — N184 Chronic kidney disease, stage 4 (severe): Secondary | ICD-10-CM | POA: Diagnosis not present

## 2020-08-11 ENCOUNTER — Encounter (HOSPITAL_COMMUNITY)
Admission: RE | Admit: 2020-08-11 | Discharge: 2020-08-11 | Disposition: A | Discharge status: Home / Self Care | Payer: Medicare HMO | Source: Ambulatory Visit | Attending: Vascular Surgery | Admitting: Vascular Surgery

## 2020-08-11 ENCOUNTER — Other Ambulatory Visit: Payer: Self-pay

## 2020-08-15 ENCOUNTER — Other Ambulatory Visit (HOSPITAL_COMMUNITY)
Admission: RE | Admit: 2020-08-15 | Discharge: 2020-08-15 | Disposition: A | Discharge status: Home / Self Care | Payer: Medicare HMO | Source: Ambulatory Visit | Attending: Vascular Surgery | Admitting: Vascular Surgery

## 2020-08-15 ENCOUNTER — Other Ambulatory Visit: Payer: Self-pay

## 2020-08-15 DIAGNOSIS — Z20822 Contact with and (suspected) exposure to covid-19: Secondary | ICD-10-CM | POA: Insufficient documentation

## 2020-08-15 DIAGNOSIS — Z01812 Encounter for preprocedural laboratory examination: Secondary | ICD-10-CM | POA: Insufficient documentation

## 2020-08-16 LAB — SARS CORONAVIRUS 2 (TAT 6-24 HRS): SARS Coronavirus 2: NEGATIVE

## 2020-08-17 ENCOUNTER — Encounter (HOSPITAL_COMMUNITY)
Admission: RE | Disposition: A | Discharge status: Home / Self Care | Payer: Self-pay | Source: Home / Self Care | Attending: Vascular Surgery

## 2020-08-17 ENCOUNTER — Ambulatory Visit (HOSPITAL_COMMUNITY): Payer: Medicare HMO | Admitting: Certified Registered Nurse Anesthetist

## 2020-08-17 ENCOUNTER — Ambulatory Visit (HOSPITAL_COMMUNITY)
Admission: RE | Admit: 2020-08-17 | Discharge: 2020-08-17 | Disposition: A | Discharge status: Home / Self Care | Payer: Medicare HMO | Attending: Vascular Surgery | Admitting: Vascular Surgery

## 2020-08-17 ENCOUNTER — Encounter (HOSPITAL_COMMUNITY): Payer: Self-pay | Admitting: Vascular Surgery

## 2020-08-17 DIAGNOSIS — Z79890 Hormone replacement therapy (postmenopausal): Secondary | ICD-10-CM | POA: Insufficient documentation

## 2020-08-17 DIAGNOSIS — N186 End stage renal disease: Secondary | ICD-10-CM | POA: Diagnosis not present

## 2020-08-17 DIAGNOSIS — N184 Chronic kidney disease, stage 4 (severe): Secondary | ICD-10-CM

## 2020-08-17 DIAGNOSIS — Z79899 Other long term (current) drug therapy: Secondary | ICD-10-CM | POA: Insufficient documentation

## 2020-08-17 DIAGNOSIS — I120 Hypertensive chronic kidney disease with stage 5 chronic kidney disease or end stage renal disease: Secondary | ICD-10-CM | POA: Diagnosis not present

## 2020-08-17 DIAGNOSIS — N185 Chronic kidney disease, stage 5: Secondary | ICD-10-CM | POA: Diagnosis not present

## 2020-08-17 HISTORY — PX: BASCILIC VEIN TRANSPOSITION: SHX5742

## 2020-08-17 LAB — POCT I-STAT, CHEM 8
BUN: 90 mg/dL — ABNORMAL HIGH (ref 8–23)
Calcium, Ion: 1.11 mmol/L — ABNORMAL LOW (ref 1.15–1.40)
Chloride: 105 mmol/L (ref 98–111)
Creatinine, Ser: 2.9 mg/dL — ABNORMAL HIGH (ref 0.44–1.00)
Glucose, Bld: 88 mg/dL (ref 70–99)
HCT: 34 % — ABNORMAL LOW (ref 36.0–46.0)
Hemoglobin: 11.6 g/dL — ABNORMAL LOW (ref 12.0–15.0)
Potassium: 4.6 mmol/L (ref 3.5–5.1)
Sodium: 138 mmol/L (ref 135–145)
TCO2: 24 mmol/L (ref 22–32)

## 2020-08-17 SURGERY — TRANSPOSITION, VEIN, BASILIC
Anesthesia: General | Site: Arm Upper | Laterality: Left

## 2020-08-17 MED ORDER — SODIUM CHLORIDE 0.9 % IV SOLN
INTRAVENOUS | Status: DC | PRN
  Administered 2020-08-17: 30 mL

## 2020-08-17 MED ORDER — PHENYLEPHRINE HCL (PRESSORS) 10 MG/ML IV SOLN
INTRAVENOUS | Status: DC | PRN
  Administered 2020-08-17 (×3): 80 ug via INTRAVENOUS
  Administered 2020-08-17: 120 ug via INTRAVENOUS
  Administered 2020-08-17: 80 ug via INTRAVENOUS

## 2020-08-17 MED ORDER — 0.9 % SODIUM CHLORIDE (POUR BTL) OPTIME
TOPICAL | Status: DC | PRN
  Administered 2020-08-17: 1000 mL

## 2020-08-17 MED ORDER — CEFAZOLIN SODIUM-DEXTROSE 2-4 GM/100ML-% IV SOLN
2.0000 g | INTRAVENOUS | Status: AC
  Administered 2020-08-17: 2 g via INTRAVENOUS

## 2020-08-17 MED ORDER — HEPARIN SODIUM (PORCINE) 1000 UNIT/ML IJ SOLN
INTRAMUSCULAR | Status: AC
  Filled 2020-08-17: qty 6

## 2020-08-17 MED ORDER — PROPOFOL 10 MG/ML IV BOLUS
INTRAVENOUS | Status: AC
  Filled 2020-08-17: qty 20

## 2020-08-17 MED ORDER — FENTANYL CITRATE (PF) 100 MCG/2ML IJ SOLN: 25.0000 ug | INTRAMUSCULAR | Status: DC | PRN

## 2020-08-17 MED ORDER — FENTANYL CITRATE (PF) 100 MCG/2ML IJ SOLN
INTRAMUSCULAR | Status: AC
  Filled 2020-08-17: qty 2

## 2020-08-17 MED ORDER — CEFAZOLIN SODIUM-DEXTROSE 2-4 GM/100ML-% IV SOLN
INTRAVENOUS | Status: AC
  Filled 2020-08-17: qty 100

## 2020-08-17 MED ORDER — PROPOFOL 10 MG/ML IV BOLUS
INTRAVENOUS | Status: DC | PRN
  Administered 2020-08-17: 30 mg via INTRAVENOUS
  Administered 2020-08-17: 20 mg via INTRAVENOUS
  Administered 2020-08-17: 120 mg via INTRAVENOUS

## 2020-08-17 MED ORDER — LIDOCAINE HCL (CARDIAC) PF 100 MG/5ML IV SOSY
PREFILLED_SYRINGE | INTRAVENOUS | Status: DC | PRN
  Administered 2020-08-17: 60 mg via INTRAVENOUS

## 2020-08-17 MED ORDER — LIDOCAINE-EPINEPHRINE 0.5 %-1:200000 IJ SOLN
INTRAMUSCULAR | Status: AC
  Filled 2020-08-17: qty 1

## 2020-08-17 MED ORDER — OXYCODONE-ACETAMINOPHEN 5-325 MG PO TABS: 1.0000 | ORAL_TABLET | Freq: Four times a day (QID) | ORAL | 0 refills | Status: DC | PRN

## 2020-08-17 MED ORDER — FENTANYL CITRATE (PF) 250 MCG/5ML IJ SOLN
INTRAMUSCULAR | Status: DC | PRN
  Administered 2020-08-17: 25 ug via INTRAVENOUS
  Administered 2020-08-17 (×2): 50 ug via INTRAVENOUS
  Administered 2020-08-17: 25 ug via INTRAVENOUS
  Administered 2020-08-17: 75 ug via INTRAVENOUS
  Administered 2020-08-17: 25 ug via INTRAVENOUS

## 2020-08-17 MED ORDER — EPHEDRINE SULFATE 50 MG/ML IJ SOLN
INTRAMUSCULAR | Status: DC | PRN
  Administered 2020-08-17 (×2): 5 mg via INTRAVENOUS
  Administered 2020-08-17 (×3): 10 mg via INTRAVENOUS
  Administered 2020-08-17 (×2): 5 mg via INTRAVENOUS

## 2020-08-17 MED ORDER — CHLORHEXIDINE GLUCONATE 4 % EX LIQD: 60.0000 mL | Freq: Once | CUTANEOUS | Status: DC

## 2020-08-17 MED ORDER — EPHEDRINE 5 MG/ML INJ
INTRAVENOUS | Status: AC
  Filled 2020-08-17: qty 10

## 2020-08-17 MED ORDER — SODIUM CHLORIDE 0.9 % IV SOLN
INTRAVENOUS | Status: DC
  Administered 2020-08-17: 1000 mL via INTRAVENOUS

## 2020-08-17 MED ORDER — ORAL CARE MOUTH RINSE: 15.0000 mL | Freq: Once | OROMUCOSAL | Status: AC

## 2020-08-17 MED ORDER — GLYCOPYRROLATE 0.2 MG/ML IJ SOLN
INTRAMUSCULAR | Status: DC | PRN
  Administered 2020-08-17 (×2): .1 mg via INTRAVENOUS

## 2020-08-17 MED ORDER — CHLORHEXIDINE GLUCONATE 0.12 % MT SOLN
15.0000 mL | Freq: Once | OROMUCOSAL | Status: AC
  Administered 2020-08-17: 15 mL via OROMUCOSAL

## 2020-08-17 MED ORDER — ONDANSETRON HCL 4 MG/2ML IJ SOLN: 4.0000 mg | Freq: Once | INTRAMUSCULAR | Status: DC | PRN

## 2020-08-17 MED ORDER — ONDANSETRON HCL 4 MG/2ML IJ SOLN
INTRAMUSCULAR | Status: DC | PRN
  Administered 2020-08-17: 4 mg via INTRAVENOUS

## 2020-08-17 SURGICAL SUPPLY — 44 items
ADH SKN CLS APL DERMABOND .7 (GAUZE/BANDAGES/DRESSINGS) ×1
ARMBAND PINK RESTRICT EXTREMIT (MISCELLANEOUS) ×3 IMPLANT
BAG HAMPER (MISCELLANEOUS) ×6 IMPLANT
BNDG ELASTIC 4X5.8 VLCR STR LF (GAUZE/BANDAGES/DRESSINGS) ×6 IMPLANT
BNDG GAUZE ELAST 4 BULKY (GAUZE/BANDAGES/DRESSINGS) ×3 IMPLANT
CANNULA VESSEL 3MM 2 BLNT TIP (CANNULA) ×3 IMPLANT
CLIP LIGATING EXTRA MED SLVR (CLIP) ×3 IMPLANT
CLIP LIGATING EXTRA SM BLUE (MISCELLANEOUS) ×3 IMPLANT
COVER LIGHT HANDLE STERIS (MISCELLANEOUS) ×3 IMPLANT
COVER MAYO STAND XLG (MISCELLANEOUS) ×3 IMPLANT
COVER PROBE W GEL 5X96 (DRAPES) ×3 IMPLANT
COVER WAND RF STERILE (DRAPES) ×3 IMPLANT
DECANTER SPIKE VIAL GLASS SM (MISCELLANEOUS) ×3 IMPLANT
DERMABOND ADVANCED (GAUZE/BANDAGES/DRESSINGS) ×2
DERMABOND ADVANCED .7 DNX12 (GAUZE/BANDAGES/DRESSINGS) ×1 IMPLANT
DRAPE HALF SHEET 40X57 (DRAPES) ×3 IMPLANT
ELECT REM PT RETURN 9FT ADLT (ELECTROSURGICAL) ×3
ELECTRODE REM PT RTRN 9FT ADLT (ELECTROSURGICAL) ×1 IMPLANT
GAUZE SPONGE 4X4 12PLY STRL (GAUZE/BANDAGES/DRESSINGS) ×3 IMPLANT
GLOVE SS BIOGEL STRL SZ 7.5 (GLOVE) ×1 IMPLANT
GLOVE SUPERSENSE BIOGEL SZ 7.5 (GLOVE) ×2
GLOVE SURG UNDER POLY LF SZ7 (GLOVE) ×9 IMPLANT
GOWN STRL REUS W/TWL LRG LVL3 (GOWN DISPOSABLE) ×9 IMPLANT
IV NS 500ML (IV SOLUTION) ×3
IV NS 500ML BAXH (IV SOLUTION) ×1 IMPLANT
KIT BLADEGUARD II DBL (SET/KITS/TRAYS/PACK) ×3 IMPLANT
KIT TURNOVER KIT A (KITS) ×3 IMPLANT
MANIFOLD NEPTUNE II (INSTRUMENTS) ×6 IMPLANT
MARKER SKIN DUAL TIP RULER LAB (MISCELLANEOUS) ×6 IMPLANT
NEEDLE HYPO 18GX1.5 BLUNT FILL (NEEDLE) ×3 IMPLANT
NS IRRIG 1000ML POUR BTL (IV SOLUTION) ×3 IMPLANT
PACK CV ACCESS (CUSTOM PROCEDURE TRAY) ×3 IMPLANT
PAD ARMBOARD 7.5X6 YLW CONV (MISCELLANEOUS) ×3 IMPLANT
SET BASIN LINEN APH (SET/KITS/TRAYS/PACK) ×3 IMPLANT
SOL PREP PROV IODINE SCRUB 4OZ (MISCELLANEOUS) ×3 IMPLANT
SUT PROLENE 6 0 CC (SUTURE) ×6 IMPLANT
SUT SILK 2 0 SH (SUTURE) ×6 IMPLANT
SUT SILK 3 0 (SUTURE) ×3
SUT SILK 3-0 18XBRD TIE 12 (SUTURE) ×1 IMPLANT
SUT VIC AB 3-0 SH 27 (SUTURE) ×6
SUT VIC AB 3-0 SH 27X BRD (SUTURE) ×2 IMPLANT
SYR 20ML LL LF (SYRINGE) ×3 IMPLANT
SYR CONTROL 10ML LL (SYRINGE) ×3 IMPLANT
WATER STERILE IRR 1000ML POUR (IV SOLUTION) ×3 IMPLANT

## 2020-08-17 NOTE — Op Note (Signed)
    OPERATIVE REPORT  DATE OF SURGERY: 08/17/2020  PATIENT: Sandra Hunter, 80 y.o. female MRN: [id number]  DOB: 11/16/1940  PRE-OPERATIVE DIAGNOSIS: Chronic renal insufficiency  POST-OPERATIVE DIAGNOSIS:  Same  PROCEDURE: Left arm second stage basilic vein transposition  SURGEON:  Curt Jews, M.D.  PHYSICIAN ASSISTANT: Fulton Mole, RNFA  The assistant was needed for exposure and to expedite the case  ANESTHESIA: General  EBL: per anesthesia record  Total I/O In: 900 [I.V.:800; IV Piggyback:100] Out: 5 [Blood:5]  BLOOD ADMINISTERED: none  DRAINS: none  SPECIMEN: none  COUNTS CORRECT:  YES  PATIENT DISPOSITION:  PACU - hemodynamically stable  PROCEDURE DETAILS: Patient was taken operating placed supine position where the area of the left arm was prepped draped you sterile fashion.  Incision was made through the prior brachiobasilic fistula.  The artery was very tortuous.  The vein was identified at the arterial anastomosis.  A separate incision was made in the mid upper arm and then near the axilla and the vein was mobilized circumferentially through these incisions.  Skin was bridges were left.  He was of excellent caliber.  Tributary branches were ligated with 3-0 silk ties and divided.  The brachial artery was occluded proximally distally and with the old anastomosis of the old anastomosis was taken down.  The vein was brought out through the tunnel and was marked to reduce risk of twisting.  The vein was gently dilated and was of excellent caliber.  A tunnel was created from the antecubital incision to the axillary incision and the vein was brought through the tunnel.  The vein was cut to the appropriate length and spatulated and sewn end-to-side to the brachial artery with a running 6-0 Prolene suture.  Clamps removed and excellent thrill was noted.  Wounds irrigated with saline.  Hemostasis obtained electrocautery.  Wounds were closed with 3-0 Vicryl in the  subcutaneous and subcuticular tissue.  Sterile dressing was applied the patient was transferred to the recovery room in stable condition   Rosetta Posner, M.D., [facility] 08/17/2020 9:47 AM  Note: Portions of this report may have been transcribed using voice recognition software.  Every effort has been made to ensure accuracy; however, inadvertent computerized transcription errors may still be present.

## 2020-08-17 NOTE — Anesthesia Preprocedure Evaluation (Signed)
Anesthesia Evaluation  Patient identified by MRN, date of birth, ID band Patient awake    Reviewed: Allergy & Precautions, H&P , NPO status , Patient's Chart, lab work & pertinent test results, reviewed documented beta blocker date and time   Airway Mallampati: II  TM Distance: >3 FB Neck ROM: full    Dental no notable dental hx.    Pulmonary neg pulmonary ROS,    Pulmonary exam normal breath sounds clear to auscultation       Cardiovascular Exercise Tolerance: Good hypertension, negative cardio ROS   Rhythm:regular Rate:Normal     Neuro/Psych negative neurological ROS  negative psych ROS   GI/Hepatic negative GI ROS, Neg liver ROS,   Endo/Other  Hypothyroidism   Renal/GU negative Renal ROS  negative genitourinary   Musculoskeletal   Abdominal   Peds  Hematology negative hematology ROS (+)   Anesthesia Other Findings   Reproductive/Obstetrics negative OB ROS                             Anesthesia Physical  Anesthesia Plan  ASA: III  Anesthesia Plan: General   Post-op Pain Management:    Induction:   PONV Risk Score and Plan: Propofol infusion  Airway Management Planned:   Additional Equipment:   Intra-op Plan:   Post-operative Plan:   Informed Consent: I have reviewed the patients History and Physical, chart, labs and discussed the procedure including the risks, benefits and alternatives for the proposed anesthesia with the patient or authorized representative who has indicated his/her understanding and acceptance.     Dental Advisory Given  Plan Discussed with: CRNA  Anesthesia Plan Comments:         Anesthesia Quick Evaluation

## 2020-08-17 NOTE — Anesthesia Procedure Notes (Signed)
Procedure Name: LMA Insertion Date/Time: 08/17/2020 7:33 AM Performed by: Karna Dupes, CRNA Pre-anesthesia Checklist: Patient identified, Emergency Drugs available, Suction available and Patient being monitored Patient Re-evaluated:Patient Re-evaluated prior to induction Oxygen Delivery Method: Circle system utilized Preoxygenation: Pre-oxygenation with 100% oxygen Induction Type: IV induction LMA: LMA inserted LMA Size: 4.0 Number of attempts: 1 Airway Equipment and Method: Patient positioned with wedge pillow Tube secured with: Tape Dental Injury: Teeth and Oropharynx as per pre-operative assessment

## 2020-08-17 NOTE — Discharge Instructions (Signed)
[facility]  Discharge Instructions  AV Fistula or Graft Surgery for Dialysis Access  Please refer to the following instructions for your post-procedure care. Your surgeon or physician assistant will discuss any changes with you.  Activity  You may drive the day following your surgery, if you are comfortable and no longer taking prescription pain medication. Resume full activity as the soreness in your incision resolves.  Bathing/Showering  You may shower after you go home. Keep your incision dry for 48 hours. Do not soak in a bathtub, hot tub, or swim until the incision heals completely. You may not shower if you have a hemodialysis catheter.  Incision Care  Clean your incision with mild soap and water after 48 hours. Pat the area dry with a clean towel. You do not need a bandage unless otherwise instructed. Do not apply any ointments or creams to your incision. You may have skin glue on your incision. Do not peel it off. It will come off on its own in about one week. Your arm may swell a bit after surgery. To reduce swelling use pillows to elevate your arm so it is above your heart. Your doctor will tell you if you need to lightly wrap your arm with an ACE bandage.  Diet  Resume your normal diet. There are not special food restrictions following this procedure. In order to heal from your surgery, it is CRITICAL to get adequate nutrition. Your body requires vitamins, minerals, and protein. Vegetables are the best source of vitamins and minerals. Vegetables also provide the perfect balance of protein. Processed food has little nutritional value, so try to avoid this.  Medications  Resume taking all of your medications. If your incision is causing pain, you may take over-the counter pain relievers such as acetaminophen (Tylenol). If you were prescribed a stronger pain medication, please be aware these medications can cause nausea and constipation. Prevent  nausea by taking the medication with a snack or meal. Avoid constipation by drinking plenty of fluids and eating foods with high amount of fiber, such as fruits, vegetables, and grains.  Do not take Tylenol if you are taking prescription pain medications.  Follow up Your surgeon may want to see you in the office following your access surgery. If so, this will be arranged at the time of your surgery.  Please call us immediately for any of the following conditions:  . Increased pain, redness, drainage (pus) from your incision site . Fever of 101 degrees or higher . Severe or worsening pain at your incision site . Hand pain or numbness. .  Reduce your risk of vascular disease:  . Stop smoking. If you would like help, call [facility] at 1-800-QUIT-NOW 713-014-4401) or [place] at 639-067-1371  . Manage your cholesterol . Maintain a desired weight . Control your diabetes . Keep your blood pressure down  Dialysis  It will take several weeks to several months for your new dialysis access to be ready for use. Your surgeon will determine when it is okay to use it. Your nephrologist will continue to direct your dialysis. You can continue to use your Permcath until your new access is ready for use.   08/17/2020 Sandra Hunter [id number] Jan 03, 1941  Surgeon(s): Early, Arvilla Meres, MD  Procedure(s): LEFT ARM SECOND STAGE [place]             If you have any questions, please call the office at [phone].    Monitored Anesthesia  Care, Care After This sheet gives you information about how to care for yourself after your procedure. Your health care provider may also give you more specific instructions. If you have problems or questions, contact your health care provider. What can I expect after the procedure? After the procedure, it is common to have:  Tiredness.  Forgetfulness about what happened after the procedure.  Impaired judgment for important  decisions.  Nausea or vomiting.  Some difficulty with balance. Follow these instructions at home: For the time period you were told by your health care provider:  Rest as needed.  Do not participate in activities where you could fall or become injured.  Do not drive or use machinery.  Do not drink alcohol.  Do not take sleeping pills or medicines that cause drowsiness.  Do not make important decisions or sign legal documents.  Do not take care of children on your own.      Eating and drinking  Follow the diet that is recommended by your health care provider.  Drink enough fluid to keep your urine pale yellow.  If you vomit: ? Drink water, juice, or soup when you can drink without vomiting. ? Make sure you have little or no nausea before eating solid foods. General instructions  Have a responsible adult stay with you for the time you are told. It is important to have someone help care for you until you are awake and alert.  Take over-the-counter and prescription medicines only as told by your health care provider.  If you have sleep apnea, surgery and certain medicines can increase your risk for breathing problems. Follow instructions from your health care provider about wearing your sleep device: ? Anytime you are sleeping, including during daytime naps. ? While taking prescription pain medicines, sleeping medicines, or medicines that make you drowsy.  Avoid smoking.  Keep all follow-up visits as told by your health care provider. This is important. Contact a health care provider if:  You keep feeling nauseous or you keep vomiting.  You feel light-headed.  You are still sleepy or having trouble with balance after 24 hours.  You develop a rash.  You have a fever.  You have redness or swelling around the IV site. Get help right away if:  You have trouble breathing.  You have new-onset confusion at home. Summary  For several hours after your procedure,  you may feel tired. You may also be forgetful and have poor judgment.  Have a responsible adult stay with you for the time you are told. It is important to have someone help care for you until you are awake and alert.  Rest as told. Do not drive or operate machinery. Do not drink alcohol or take sleeping pills.  Get help right away if you have trouble breathing, or if you suddenly become confused. This information is not intended to replace advice given to you by your health care provider. Make sure you discuss any questions you have with your health care provider. Document Revised: 01/13/2020 Document Reviewed: 04/01/2019 Elsevier Patient Education  2021 [employer].

## 2020-08-17 NOTE — Transfer of Care (Signed)
Immediate Anesthesia Transfer of Care Note  Patient: Sandra Hunter  Procedure(s) Performed: LEFT ARM SECOND STAGE [place] (Left Arm Upper)  Patient Location: PACU  Anesthesia Type:General  Level of Consciousness: awake and alert   Airway & Oxygen Therapy: Patient Spontanous Breathing and Patient connected to nasal cannula oxygen  Post-op Assessment: Report given to RN and Post -op Vital signs reviewed and stable  Post vital signs: Reviewed and stable  Last Vitals:  Vitals Value Taken Time  BP 133/70 08/17/20 0945  Temp 98.2   Pulse 73 08/17/20 0944  Resp 8 08/17/20 0944  SpO2 97 % 08/17/20 0944  Vitals shown include unvalidated device data.  Last Pain:  Vitals:   08/17/20 0657  TempSrc: Oral  PainSc: 6       Patients Stated Pain Goal: 8 ([id number])  Complications: No complications documented.

## 2020-08-17 NOTE — Interval H&P Note (Signed)
History and Physical Interval Note:  08/17/2020 7:21 AM  Sandra Hunter  has presented today for surgery, with the diagnosis of ESRD.  The various methods of treatment have been discussed with the patient and family. After consideration of risks, benefits and other options for treatment, the patient has consented to  Procedure(s): LEFT ARM SECOND STAGE [place] (Left) as a surgical intervention.  The patient's history has been reviewed, patient examined, no change in status, stable for surgery.  I have reviewed the patient's chart and labs.  Questions were answered to the patient's satisfaction.     Curt Jews

## 2020-08-17 NOTE — Anesthesia Postprocedure Evaluation (Signed)
Anesthesia Post Note  Patient: Sandra Hunter  Procedure(s) Performed: LEFT ARM SECOND STAGE BASCILIC VEIN TRANSPOSITION (Left Arm Upper)  Patient location during evaluation: Phase II Anesthesia Type: General Level of consciousness: awake Pain management: pain level controlled Vital Signs Assessment: post-procedure vital signs reviewed and stable Respiratory status: spontaneous breathing and respiratory function stable Cardiovascular status: blood pressure returned to baseline and stable Postop Assessment: no headache and no apparent nausea or vomiting Anesthetic complications: no Comments: Late entry   No complications documented.   Last Vitals:  Vitals:   08/17/20 1030 08/17/20 1041  BP: 136/71 (!) 148/75  Pulse: 70 72  Resp: 18 18  Temp:  36.5 C  SpO2: 96% 98%    Last Pain:  Vitals:   08/17/20 1041  TempSrc: Oral  PainSc: 0-No pain                 Louann Sjogren

## 2020-08-18 ENCOUNTER — Other Ambulatory Visit: Payer: Self-pay | Admitting: Physician Assistant

## 2020-08-18 ENCOUNTER — Telehealth: Payer: Self-pay

## 2020-08-18 ENCOUNTER — Encounter (HOSPITAL_COMMUNITY): Payer: Self-pay | Admitting: Vascular Surgery

## 2020-08-18 MED ORDER — OXYCODONE-ACETAMINOPHEN 5-325 MG PO TABS: 1.0000 | ORAL_TABLET | ORAL | 0 refills | Status: DC | PRN

## 2020-08-18 NOTE — Telephone Encounter (Signed)
Patient called to report more pain with this BVT than the first - she is afraid she will run out of pain medicine over the weekend. PA sent in pain med RX.

## 2020-09-13 DIAGNOSIS — D509 Iron deficiency anemia, unspecified: Secondary | ICD-10-CM | POA: Diagnosis not present

## 2020-09-13 DIAGNOSIS — N184 Chronic kidney disease, stage 4 (severe): Secondary | ICD-10-CM | POA: Diagnosis not present

## 2020-09-13 DIAGNOSIS — D631 Anemia in chronic kidney disease: Secondary | ICD-10-CM | POA: Diagnosis not present

## 2020-09-18 ENCOUNTER — Other Ambulatory Visit: Payer: Self-pay

## 2020-09-18 ENCOUNTER — Ambulatory Visit (INDEPENDENT_AMBULATORY_CARE_PROVIDER_SITE_OTHER): Payer: Medicare HMO | Admitting: Vascular Surgery

## 2020-09-18 ENCOUNTER — Encounter: Payer: Self-pay | Admitting: Vascular Surgery

## 2020-09-18 VITALS — BP 143/88 | HR 68 | Temp 97.3°F | Resp 12 | Ht 63.5 in | Wt 130.0 lb

## 2020-09-18 DIAGNOSIS — N184 Chronic kidney disease, stage 4 (severe): Secondary | ICD-10-CM

## 2020-09-18 NOTE — Progress Notes (Signed)
   [facility] [place]  Patient name: Sandra Hunter MRN: [id number] DOB: 1940/08/10 Sex: female  REASON FOR VISIT: Follow-up second stage basilic vein transposition for 08/17/2020  HPI: Sandra Hunter is a 80 y.o. female here today for follow-up.  She still is not on hemodialysis.  She underwent second stage basilic vein transposition at [facility] on 08/17/2020.  She has had complete healing.  She does have some occasional cyanotic changes in the hands and fingertips on the left but also has this on the right.  She does not have steal symptoms.  Current Outpatient Medications  Medication Sig Dispense Refill  . acetaminophen (TYLENOL) 500 MG tablet Take 1,000 mg by mouth daily.    Marland Kitchen amLODipine (NORVASC) 10 MG tablet Take 10 mg by mouth daily.  5  . calcitRIOL (ROCALTROL) 0.25 MCG capsule Take 0.25 mcg by mouth daily.    Marland Kitchen EPINEPHrine (PRIMATENE MIST) 0.125 MG/ACT AERO Inhale 1 puff into the lungs daily as needed (Wheezing and Asthma).    . escitalopram (LEXAPRO) 10 MG tablet Take 10 mg by mouth daily.    . furosemide (LASIX) 40 MG tablet Take 40 mg by mouth daily.    Marland Kitchen levothyroxine (SYNTHROID) 50 MCG tablet Take 50 mcg by mouth daily before breakfast.    . metoprolol succinate (TOPROL-XL) 25 MG 24 hr tablet Take 25 mg by mouth daily.     No current facility-administered medications for this visit.     PHYSICAL EXAM: Vitals:   09/18/20 1121  BP: (!) 143/88  Pulse: 68  Resp: 12  Temp: (!) 97.3 F (36.3 C)  TempSrc: Other (Comment)  SpO2: 98%  Weight: 130 lb (59 kg)  Height: 5' 3.5" (1.613 m)    GENERAL: The patient is a well-nourished female, in no acute distress. The vital signs are documented above. Excellent healing of her left upper arm second stage transposition fistula.  She has great size maturation and good thrill through the fistula.  I do not palpate a left radial pulse.  This is palpable with  compression of her fistula  MEDICAL ISSUES: Patient continues to do well from her left upper arm AV fistula creation.  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  She is not having any pain or weakness in her left hand.  She is right-handed.  I have asked her to continue to keep an eye on this.  She will follow-up with [place] on an as-needed basis   Rosetta Posner, MD FACS [facility] Tel [phone]  Note: Portions of this report may have been transcribed using voice recognition software.  Every effort has been made to ensure accuracy; however, inadvertent computerized transcription errors may still be present.

## 2020-09-21 DIAGNOSIS — N184 Chronic kidney disease, stage 4 (severe): Secondary | ICD-10-CM | POA: Diagnosis not present

## 2020-09-21 DIAGNOSIS — D509 Iron deficiency anemia, unspecified: Secondary | ICD-10-CM | POA: Diagnosis not present

## 2020-10-03 DIAGNOSIS — D631 Anemia in chronic kidney disease: Secondary | ICD-10-CM | POA: Diagnosis not present

## 2020-10-03 DIAGNOSIS — I770 Arteriovenous fistula, acquired: Secondary | ICD-10-CM | POA: Diagnosis not present

## 2020-10-03 DIAGNOSIS — E875 Hyperkalemia: Secondary | ICD-10-CM | POA: Diagnosis not present

## 2020-10-03 DIAGNOSIS — M1612 Unilateral primary osteoarthritis, left hip: Secondary | ICD-10-CM | POA: Diagnosis not present

## 2020-10-03 DIAGNOSIS — N189 Chronic kidney disease, unspecified: Secondary | ICD-10-CM | POA: Diagnosis not present

## 2020-10-03 DIAGNOSIS — Z9114 Patient's other noncompliance with medication regimen: Secondary | ICD-10-CM | POA: Diagnosis not present

## 2020-10-03 DIAGNOSIS — N184 Chronic kidney disease, stage 4 (severe): Secondary | ICD-10-CM | POA: Diagnosis not present

## 2020-10-03 DIAGNOSIS — I129 Hypertensive chronic kidney disease with stage 1 through stage 4 chronic kidney disease, or unspecified chronic kidney disease: Secondary | ICD-10-CM | POA: Diagnosis not present

## 2020-10-03 DIAGNOSIS — N2581 Secondary hyperparathyroidism of renal origin: Secondary | ICD-10-CM | POA: Diagnosis not present

## 2020-12-06 DIAGNOSIS — N189 Chronic kidney disease, unspecified: Secondary | ICD-10-CM | POA: Diagnosis not present

## 2020-12-06 DIAGNOSIS — D631 Anemia in chronic kidney disease: Secondary | ICD-10-CM | POA: Diagnosis not present

## 2020-12-06 DIAGNOSIS — N184 Chronic kidney disease, stage 4 (severe): Secondary | ICD-10-CM | POA: Diagnosis not present

## 2020-12-06 DIAGNOSIS — I129 Hypertensive chronic kidney disease with stage 1 through stage 4 chronic kidney disease, or unspecified chronic kidney disease: Secondary | ICD-10-CM | POA: Diagnosis not present

## 2020-12-06 DIAGNOSIS — E875 Hyperkalemia: Secondary | ICD-10-CM | POA: Diagnosis not present

## 2020-12-06 DIAGNOSIS — N2581 Secondary hyperparathyroidism of renal origin: Secondary | ICD-10-CM | POA: Diagnosis not present

## 2021-02-06 DIAGNOSIS — I770 Arteriovenous fistula, acquired: Secondary | ICD-10-CM | POA: Diagnosis not present

## 2021-02-06 DIAGNOSIS — D631 Anemia in chronic kidney disease: Secondary | ICD-10-CM | POA: Diagnosis not present

## 2021-02-06 DIAGNOSIS — N189 Chronic kidney disease, unspecified: Secondary | ICD-10-CM | POA: Diagnosis not present

## 2021-02-06 DIAGNOSIS — Z9114 Patient's other noncompliance with medication regimen: Secondary | ICD-10-CM | POA: Diagnosis not present

## 2021-02-06 DIAGNOSIS — E875 Hyperkalemia: Secondary | ICD-10-CM | POA: Diagnosis not present

## 2021-02-06 DIAGNOSIS — N184 Chronic kidney disease, stage 4 (severe): Secondary | ICD-10-CM | POA: Diagnosis not present

## 2021-02-06 DIAGNOSIS — M1612 Unilateral primary osteoarthritis, left hip: Secondary | ICD-10-CM | POA: Diagnosis not present

## 2021-02-06 DIAGNOSIS — N2581 Secondary hyperparathyroidism of renal origin: Secondary | ICD-10-CM | POA: Diagnosis not present

## 2021-02-06 DIAGNOSIS — I129 Hypertensive chronic kidney disease with stage 1 through stage 4 chronic kidney disease, or unspecified chronic kidney disease: Secondary | ICD-10-CM | POA: Diagnosis not present

## 2021-02-12 DIAGNOSIS — N390 Urinary tract infection, site not specified: Secondary | ICD-10-CM | POA: Diagnosis not present

## 2021-02-12 DIAGNOSIS — N184 Chronic kidney disease, stage 4 (severe): Secondary | ICD-10-CM | POA: Diagnosis not present

## 2021-04-11 DIAGNOSIS — N184 Chronic kidney disease, stage 4 (severe): Secondary | ICD-10-CM | POA: Diagnosis not present

## 2021-04-11 DIAGNOSIS — I10 Essential (primary) hypertension: Secondary | ICD-10-CM | POA: Diagnosis not present

## 2021-06-04 DIAGNOSIS — N2581 Secondary hyperparathyroidism of renal origin: Secondary | ICD-10-CM | POA: Diagnosis not present

## 2021-06-04 DIAGNOSIS — M1612 Unilateral primary osteoarthritis, left hip: Secondary | ICD-10-CM | POA: Diagnosis not present

## 2021-06-04 DIAGNOSIS — D631 Anemia in chronic kidney disease: Secondary | ICD-10-CM | POA: Diagnosis not present

## 2021-06-04 DIAGNOSIS — I129 Hypertensive chronic kidney disease with stage 1 through stage 4 chronic kidney disease, or unspecified chronic kidney disease: Secondary | ICD-10-CM | POA: Diagnosis not present

## 2021-06-04 DIAGNOSIS — Z9114 Patient's other noncompliance with medication regimen: Secondary | ICD-10-CM | POA: Diagnosis not present

## 2021-06-04 DIAGNOSIS — I770 Arteriovenous fistula, acquired: Secondary | ICD-10-CM | POA: Diagnosis not present

## 2021-06-04 DIAGNOSIS — E875 Hyperkalemia: Secondary | ICD-10-CM | POA: Diagnosis not present

## 2021-06-04 DIAGNOSIS — N184 Chronic kidney disease, stage 4 (severe): Secondary | ICD-10-CM | POA: Diagnosis not present

## 2021-06-04 DIAGNOSIS — N189 Chronic kidney disease, unspecified: Secondary | ICD-10-CM | POA: Diagnosis not present

## 2021-06-05 DIAGNOSIS — N184 Chronic kidney disease, stage 4 (severe): Secondary | ICD-10-CM | POA: Diagnosis not present

## 2021-07-26 DIAGNOSIS — I129 Hypertensive chronic kidney disease with stage 1 through stage 4 chronic kidney disease, or unspecified chronic kidney disease: Secondary | ICD-10-CM | POA: Diagnosis not present

## 2021-07-26 DIAGNOSIS — N184 Chronic kidney disease, stage 4 (severe): Secondary | ICD-10-CM | POA: Diagnosis not present

## 2021-07-26 DIAGNOSIS — N2581 Secondary hyperparathyroidism of renal origin: Secondary | ICD-10-CM | POA: Diagnosis not present

## 2021-07-26 DIAGNOSIS — I770 Arteriovenous fistula, acquired: Secondary | ICD-10-CM | POA: Diagnosis not present

## 2021-07-26 DIAGNOSIS — D631 Anemia in chronic kidney disease: Secondary | ICD-10-CM | POA: Diagnosis not present

## 2021-08-10 DIAGNOSIS — N184 Chronic kidney disease, stage 4 (severe): Secondary | ICD-10-CM | POA: Diagnosis not present

## 2021-08-10 DIAGNOSIS — I10 Essential (primary) hypertension: Secondary | ICD-10-CM | POA: Diagnosis not present

## 2021-09-27 DIAGNOSIS — D631 Anemia in chronic kidney disease: Secondary | ICD-10-CM | POA: Diagnosis not present

## 2021-09-27 DIAGNOSIS — E875 Hyperkalemia: Secondary | ICD-10-CM | POA: Diagnosis not present

## 2021-09-27 DIAGNOSIS — I770 Arteriovenous fistula, acquired: Secondary | ICD-10-CM | POA: Diagnosis not present

## 2021-09-27 DIAGNOSIS — Z91148 Patient's other noncompliance with medication regimen for other reason: Secondary | ICD-10-CM | POA: Diagnosis not present

## 2021-09-27 DIAGNOSIS — N2581 Secondary hyperparathyroidism of renal origin: Secondary | ICD-10-CM | POA: Diagnosis not present

## 2021-09-27 DIAGNOSIS — I129 Hypertensive chronic kidney disease with stage 1 through stage 4 chronic kidney disease, or unspecified chronic kidney disease: Secondary | ICD-10-CM | POA: Diagnosis not present

## 2021-09-27 DIAGNOSIS — M1612 Unilateral primary osteoarthritis, left hip: Secondary | ICD-10-CM | POA: Diagnosis not present

## 2021-09-27 DIAGNOSIS — N189 Chronic kidney disease, unspecified: Secondary | ICD-10-CM | POA: Diagnosis not present

## 2021-09-27 DIAGNOSIS — N184 Chronic kidney disease, stage 4 (severe): Secondary | ICD-10-CM | POA: Diagnosis not present

## 2021-10-01 DIAGNOSIS — N184 Chronic kidney disease, stage 4 (severe): Secondary | ICD-10-CM | POA: Diagnosis not present

## 2022-01-16 DIAGNOSIS — Z96642 Presence of left artificial hip joint: Secondary | ICD-10-CM | POA: Diagnosis not present

## 2022-01-16 DIAGNOSIS — M25551 Pain in right hip: Secondary | ICD-10-CM | POA: Diagnosis not present

## 2022-01-16 DIAGNOSIS — S72111A Displaced fracture of greater trochanter of right femur, initial encounter for closed fracture: Secondary | ICD-10-CM | POA: Diagnosis not present

## 2022-01-16 DIAGNOSIS — M545 Low back pain, unspecified: Secondary | ICD-10-CM | POA: Diagnosis not present

## 2022-02-09 DIAGNOSIS — N184 Chronic kidney disease, stage 4 (severe): Secondary | ICD-10-CM | POA: Diagnosis not present

## 2022-02-09 DIAGNOSIS — I10 Essential (primary) hypertension: Secondary | ICD-10-CM | POA: Diagnosis not present

## 2022-02-11 DIAGNOSIS — M545 Low back pain, unspecified: Secondary | ICD-10-CM | POA: Diagnosis not present

## 2022-02-20 DIAGNOSIS — M545 Low back pain, unspecified: Secondary | ICD-10-CM | POA: Diagnosis not present

## 2022-02-20 DIAGNOSIS — M48062 Spinal stenosis, lumbar region with neurogenic claudication: Secondary | ICD-10-CM | POA: Diagnosis not present

## 2022-02-20 DIAGNOSIS — M4180 Other forms of scoliosis, site unspecified: Secondary | ICD-10-CM | POA: Diagnosis not present

## 2022-02-28 DIAGNOSIS — M1612 Unilateral primary osteoarthritis, left hip: Secondary | ICD-10-CM | POA: Diagnosis not present

## 2022-02-28 DIAGNOSIS — N189 Chronic kidney disease, unspecified: Secondary | ICD-10-CM | POA: Diagnosis not present

## 2022-02-28 DIAGNOSIS — E875 Hyperkalemia: Secondary | ICD-10-CM | POA: Diagnosis not present

## 2022-02-28 DIAGNOSIS — N184 Chronic kidney disease, stage 4 (severe): Secondary | ICD-10-CM | POA: Diagnosis not present

## 2022-02-28 DIAGNOSIS — Z91148 Patient's other noncompliance with medication regimen for other reason: Secondary | ICD-10-CM | POA: Diagnosis not present

## 2022-02-28 DIAGNOSIS — N2581 Secondary hyperparathyroidism of renal origin: Secondary | ICD-10-CM | POA: Diagnosis not present

## 2022-02-28 DIAGNOSIS — D631 Anemia in chronic kidney disease: Secondary | ICD-10-CM | POA: Diagnosis not present

## 2022-02-28 DIAGNOSIS — I770 Arteriovenous fistula, acquired: Secondary | ICD-10-CM | POA: Diagnosis not present

## 2022-02-28 DIAGNOSIS — I129 Hypertensive chronic kidney disease with stage 1 through stage 4 chronic kidney disease, or unspecified chronic kidney disease: Secondary | ICD-10-CM | POA: Diagnosis not present

## 2022-03-01 DIAGNOSIS — M545 Low back pain, unspecified: Secondary | ICD-10-CM | POA: Diagnosis not present

## 2022-03-01 DIAGNOSIS — M47896 Other spondylosis, lumbar region: Secondary | ICD-10-CM | POA: Diagnosis not present

## 2022-03-01 DIAGNOSIS — M5416 Radiculopathy, lumbar region: Secondary | ICD-10-CM | POA: Diagnosis not present

## 2022-03-04 DIAGNOSIS — N184 Chronic kidney disease, stage 4 (severe): Secondary | ICD-10-CM | POA: Diagnosis not present

## 2022-03-07 ENCOUNTER — Other Ambulatory Visit (HOSPITAL_COMMUNITY): Payer: Self-pay | Admitting: Internal Medicine

## 2022-03-07 DIAGNOSIS — Z780 Asymptomatic menopausal state: Secondary | ICD-10-CM

## 2022-03-14 DIAGNOSIS — M5416 Radiculopathy, lumbar region: Secondary | ICD-10-CM | POA: Diagnosis not present

## 2022-03-27 ENCOUNTER — Other Ambulatory Visit (HOSPITAL_COMMUNITY): Payer: Medicare HMO

## 2022-03-29 DIAGNOSIS — M47816 Spondylosis without myelopathy or radiculopathy, lumbar region: Secondary | ICD-10-CM | POA: Diagnosis not present

## 2022-03-29 DIAGNOSIS — M5416 Radiculopathy, lumbar region: Secondary | ICD-10-CM | POA: Diagnosis not present

## 2022-04-15 DIAGNOSIS — M5416 Radiculopathy, lumbar region: Secondary | ICD-10-CM | POA: Diagnosis not present

## 2022-07-22 DIAGNOSIS — M47816 Spondylosis without myelopathy or radiculopathy, lumbar region: Secondary | ICD-10-CM | POA: Diagnosis not present

## 2022-07-22 DIAGNOSIS — M5416 Radiculopathy, lumbar region: Secondary | ICD-10-CM | POA: Diagnosis not present

## 2022-07-31 DIAGNOSIS — M47816 Spondylosis without myelopathy or radiculopathy, lumbar region: Secondary | ICD-10-CM | POA: Diagnosis not present

## 2022-08-21 DIAGNOSIS — M47816 Spondylosis without myelopathy or radiculopathy, lumbar region: Secondary | ICD-10-CM | POA: Diagnosis not present

## 2022-09-10 DIAGNOSIS — M47817 Spondylosis without myelopathy or radiculopathy, lumbosacral region: Secondary | ICD-10-CM | POA: Diagnosis not present

## 2022-10-25 DIAGNOSIS — M79601 Pain in right arm: Secondary | ICD-10-CM | POA: Diagnosis not present

## 2022-10-25 DIAGNOSIS — M47896 Other spondylosis, lumbar region: Secondary | ICD-10-CM | POA: Diagnosis not present

## 2022-10-25 DIAGNOSIS — M5416 Radiculopathy, lumbar region: Secondary | ICD-10-CM | POA: Diagnosis not present

## 2023-02-18 DIAGNOSIS — I10 Essential (primary) hypertension: Secondary | ICD-10-CM | POA: Diagnosis not present

## 2023-02-18 DIAGNOSIS — Z79899 Other long term (current) drug therapy: Secondary | ICD-10-CM | POA: Diagnosis not present

## 2023-02-18 DIAGNOSIS — E039 Hypothyroidism, unspecified: Secondary | ICD-10-CM | POA: Diagnosis not present

## 2023-02-18 DIAGNOSIS — N184 Chronic kidney disease, stage 4 (severe): Secondary | ICD-10-CM | POA: Diagnosis not present

## 2023-02-18 DIAGNOSIS — F329 Major depressive disorder, single episode, unspecified: Secondary | ICD-10-CM | POA: Diagnosis not present

## 2023-03-13 ENCOUNTER — Ambulatory Visit
Admission: RE | Admit: 2023-03-13 | Discharge: 2023-03-13 | Disposition: A | Discharge status: Home / Self Care | Payer: Medicare HMO | Source: Ambulatory Visit

## 2023-03-13 VITALS — BP 126/68 | HR 85 | Temp 97.7°F | Resp 20

## 2023-03-13 DIAGNOSIS — H6591 Unspecified nonsuppurative otitis media, right ear: Secondary | ICD-10-CM | POA: Diagnosis not present

## 2023-03-13 DIAGNOSIS — K429 Umbilical hernia without obstruction or gangrene: Secondary | ICD-10-CM

## 2023-03-13 MED ORDER — CETIRIZINE HCL 10 MG PO TABS: 10.0000 mg | ORAL_TABLET | Freq: Every day | ORAL | 0 refills | Status: AC

## 2023-03-13 NOTE — Discharge Instructions (Signed)
Take medication as prescribed. May take over-the-counter Tylenol as needed for pain, fever, or general discomfort. May apply cotton balls to the right ear to help with drainage. Do not stick or insert anything in the ear such as Q-tips or Bobby pins while symptoms persist. Avoid entrance of water inside of the right ear while symptoms persist. If you develop ear pain, decreased hearing, or other concerns, you may follow-up in this clinic or with your primary care physician for further evaluation.  For your hernia: Make sure you are drinking plenty of fluids, preferably water. Make sure you are eating a diet that is high in fiber to prevent constipation. Avoid heavy lifting or straining. Go to the emergency department immediately if you experience fever, severe abdominal pain, color changes to your abdomen, constipation, bloating, gas, bloody stools, or other concerns. Keep scheduled appointment with your primary care physician. Follow-up as needed.

## 2023-03-13 NOTE — ED Triage Notes (Signed)
States she has fluid coming out of right ear x 3 weeks. States she feels fluid moving in that ear.   States she also has a hernia that she want someone to look at.  States she has had the hernia for 4 weeks but has gotten bigger over the past month. States she has an appointment to see Dr. Ouida Sills on 11/12.

## 2023-03-13 NOTE — ED Provider Notes (Signed)
[facility]    CSN: [id number] Arrival date & time: 03/13/23  1108      History   Chief Complaint No chief complaint on file.   HPI Sandra Hunter is a 82 y.o. female.   The history is provided by the patient.   Patient presents for complaints of right ear drainage and evaluation of an umbilical hernia.  Patient states hernia has been present for the past 4 years.  Patient reports hernia has become larger over the past 4 months.  She states that she has had some abdominal pain, but pain is better today.  She denies fever, chills, nausea, vomiting, chest pain, gas, bloody stools, color changes of the hernia, bloating, diarrhea, constipation, or urinary symptoms.  Patient states she is scheduled to see her PCP in December.  Patient also reports complaints of drainage from the right ear that is been present for the past 3 weeks.  Patient states when she lies down, she can hear a swishing noise, like "water in the sink."  Patient denies right ear pain, fever, chills, nasal congestion, runny nose, cough, or decreased hearing.  Patient denies history of seasonal allergies.  She reports she is not taking any medication for her symptoms.  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   Constipation 04/14/2018   ARF (acute renal failure) (HCC)    Closed fracture of left hip (HCC)    Malnutrition of moderate degree 04/07/2018   Femoral neck fracture (HCC) 04/06/2018   CKD (chronic kidney disease) stage 4, GFR 15-29 ml/min (HCC) 04/06/2018   CELLULITIS AND ABSCESS OF UPPER ARM AND FOREARM 12/04/2009   Hypothyroidism 03/22/2009   MITRAL REGURGITATION 03/22/2009   Essential hypertension  03/22/2009   HYPERTENSION, PULMONARY 03/22/2009   UNDIAGNOSED CARDIAC MURMURS 03/22/2009    Past Surgical History:  Procedure Laterality Date   BASCILIC VEIN TRANSPOSITION Left 06/22/2020   Procedure: LEFT FIRST STAGE BASCILIC VEIN TRANSPOSITION;  Surgeon: Sandra Earthly, MD;  Location: [facility];  Service: Vascular;  Laterality: Left;   BASCILIC VEIN TRANSPOSITION Left 08/17/2020   Procedure: LEFT ARM SECOND STAGE BASCILIC VEIN TRANSPOSITION;  Surgeon: Sandra Earthly, MD;  Location: [facility];  Service: Vascular;  Laterality: Left;   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 10/22/2018   Procedure: CONVERSION TO LEFT TOTAL ANTERIOR APPROACH HIP;  Surgeon: Sandra Romans, MD;  Location: [facility];  Service: Orthopedics;  Laterality: Left;   INTRAMEDULLARY (IM) NAIL INTERTROCHANTERIC Left 04/07/2018   Procedure: INTRAMEDULLARY (IM) NAIL INTERTROCHANTRIC;  Surgeon: Sandra Galas, MD;  Location: [facility];  Service: Orthopedics;  Laterality: Left;   SEPTOPLASTY  1986   TOTAL ABDOMINAL HYSTERECTOMY W/ BILATERAL SALPINGOOPHORECTOMY  1990    OB History     Gravida  2   Para  2   Term  2   Preterm      AB      Living  2      SAB      IAB      Ectopic      Multiple      Live Births  Home Medications    Prior to Admission medications   Medication Sig Start Date End Date Taking? Authorizing Provider  cetirizine (ZYRTEC) 10 MG tablet Take 1 tablet (10 mg total) by mouth daily. 03/13/23  Yes Leath-Warren, Sandra Haber, NP  tizanidine (ZANAFLEX) 2 MG capsule Take 2 mg by mouth 3 (three) times daily as needed for muscle spasms.   Yes [provider]  acetaminophen (TYLENOL) 500 MG tablet Take 1,000 mg by mouth daily.    [provider]  amLODipine (NORVASC) 10 MG tablet Take 10 mg by mouth daily. 03/17/18   [provider]  calcitRIOL (ROCALTROL) 0.25 MCG capsule Take 0.25 mcg by mouth daily.    [provider]  EPINEPHrine (PRIMATENE MIST) 0.125 MG/ACT  AERO Inhale 1 puff into the lungs daily as needed (Wheezing and Asthma).    [provider]  escitalopram (LEXAPRO) 10 MG tablet Take 10 mg by mouth daily. 02/21/20   [provider]  furosemide (LASIX) 40 MG tablet Take 40 mg by mouth daily.    [provider]  levothyroxine (SYNTHROID) 50 MCG tablet Take 50 mcg by mouth daily before breakfast.    [provider]  metoprolol succinate (TOPROL-XL) 25 MG 24 hr tablet Take 25 mg by mouth daily. 03/08/20   [provider]    Family History Family History  Problem Relation Age of Onset   Stroke Mother     Social History Social History   Tobacco Use   Smoking status: Never   Smokeless tobacco: Never  Vaping Use   Vaping status: Never Used  Substance Use Topics   Alcohol use: Yes    Alcohol/week: 21.0 standard drinks of alcohol    Types: 21 Glasses of wine per week    Comment: 3 glasses of wine daily   Drug use: No     Allergies   Patient has no known allergies.   Review of Systems Review of Systems Per HPI  Physical Exam Triage Vital Signs ED Triage Vitals  Encounter Vitals Group     BP 03/13/23 1115 126/68     Systolic BP Percentile --      Diastolic BP Percentile --      Pulse Rate 03/13/23 1115 85     Resp 03/13/23 1115 20     Temp 03/13/23 1115 97.7 F (36.5 C)     Temp Source 03/13/23 1115 Oral     SpO2 03/13/23 1115 92 %     Weight --      Height --      Head Circumference --      Peak Flow --      Pain Score 03/13/23 1118 0     Pain Loc --      Pain Education --      Exclude from Growth Chart --    No data found.  Updated Vital Signs BP 126/68 (BP Location: Right Arm)   Pulse 85   Temp 97.7 F (36.5 C) (Oral)   Resp 20   SpO2 92%   Visual Acuity Right Eye Distance:   Left Eye Distance:   Bilateral Distance:    Right Eye Near:   Left Eye Near:    Bilateral Near:     Physical Exam Vitals and nursing note reviewed.  Constitutional:       General: She is not in acute distress.    Appearance: Normal appearance.  HENT:     Head: Normocephalic.     Left Ear:  Tympanic membrane, ear canal and external ear normal.     Nose: Nose normal.     Mouth/Throat:     Mouth: Mucous membranes are moist.  Eyes:     Extraocular Movements: Extraocular movements intact.     Pupils: Pupils are equal, round, and reactive to light.  Cardiovascular:     Rate and Rhythm: Normal rate and regular rhythm.     Pulses: Normal pulses.     Heart sounds: Normal heart sounds.  Pulmonary:     Effort: Pulmonary effort is normal.     Breath sounds: Normal breath sounds.  Abdominal:     General: Bowel sounds are normal. There is no distension.     Palpations: Abdomen is soft.     Tenderness: There is abdominal tenderness (umbilical) in the periumbilical area. There is no guarding or rebound. Right CVA tenderness: "mild" per patient.    Hernia: A hernia is present. Hernia is present in the umbilical area.     Comments: No color change noted to abdomen.    Musculoskeletal:     Cervical back: Normal range of motion.  Lymphadenopathy:     Cervical: No cervical adenopathy.  Skin:    General: Skin is warm and dry.  Neurological:     General: No focal deficit present.     Mental Status: She is alert and oriented to person, place, and time.  Psychiatric:        Mood and Affect: Mood normal.        Behavior: Behavior normal.      UC Treatments / Results  Labs (all labs ordered are listed, but only abnormal results are displayed) Labs Reviewed - No data to display  EKG   Radiology No results found.  Procedures Procedures (including critical care time)  Medications Ordered in UC Medications - No data to display  Initial Impression / Assessment and Plan / UC Course  I have reviewed the triage vital signs and the nursing notes.  Pertinent labs & imaging results that were available during my care of the patient were reviewed by me and  considered in my medical decision making (see chart for details).  On exam, patient with umbilical hernia.  There is obvious protrusion noted, difficult to determine the increase in size.  Patient does have mild tenderness to the affected area; however, patient does not have any fever, changes to the color of her abdomen, constipation, diarrhea, or acute pain.  Do not suspect acute abdomen to include strangulation or incarceration of hernia at this time.  With regard to her ear, she does have a right middle ear effusion.  Will start patient on cetirizine 10 mg to help with drainage.  Supportive care recommendations were provided and discussed with the patient to include over-the-counter analgesics, increasing her dietary fiber, warm compresses to the right ear, and avoiding entrance of water inside of her right ear.  Patient was given strict ER follow-up precautions with regard to her hernia.  Patient advised to follow-up with her PCP or in this clinic if her ear symptoms worsen.  Patient is in agreement with this plan of care and verbalizes understanding.  All questions were answered.  Patient stable for discharge.  Final Clinical Impressions(s) / UC Diagnoses   Final diagnoses:  Middle ear effusion, right  Umbilical hernia without obstruction and without gangrene     Discharge Instructions      Take medication as prescribed. May take over-the-counter Tylenol as needed for pain, fever, or  general discomfort. May apply cotton balls to the right ear to help with drainage. Do not stick or insert anything in the ear such as Q-tips or Bobby pins while symptoms persist. Avoid entrance of water inside of the right ear while symptoms persist. If you develop ear pain, decreased hearing, or other concerns, you may follow-up in this clinic or with your primary care physician for further evaluation.  For your hernia: Make sure you are drinking plenty of fluids, preferably water. Make sure you are eating  a diet that is high in fiber to prevent constipation. Avoid heavy lifting or straining. Go to the emergency department immediately if you experience fever, severe abdominal pain, color changes to your abdomen, constipation, bloating, gas, bloody stools, or other concerns. Keep scheduled appointment with your primary care physician. Follow-up as needed.     ED Prescriptions     Medication Sig Dispense Auth. Provider   cetirizine (ZYRTEC) 10 MG tablet Take 1 tablet (10 mg total) by mouth daily. 30 tablet Leath-Warren, Sandra Haber, NP      PDMP not reviewed this encounter.   Abran Cantor, NP 03/13/23 1224

## 2023-04-23 DIAGNOSIS — H524 Presbyopia: Secondary | ICD-10-CM | POA: Diagnosis not present

## 2023-04-28 DIAGNOSIS — M47896 Other spondylosis, lumbar region: Secondary | ICD-10-CM | POA: Diagnosis not present

## 2023-04-28 DIAGNOSIS — M5416 Radiculopathy, lumbar region: Secondary | ICD-10-CM | POA: Diagnosis not present

## 2023-04-28 DIAGNOSIS — M79601 Pain in right arm: Secondary | ICD-10-CM | POA: Diagnosis not present

## 2023-09-15 DIAGNOSIS — M47896 Other spondylosis, lumbar region: Secondary | ICD-10-CM | POA: Diagnosis not present

## 2023-09-15 DIAGNOSIS — M5416 Radiculopathy, lumbar region: Secondary | ICD-10-CM | POA: Diagnosis not present

## 2023-09-15 DIAGNOSIS — M79601 Pain in right arm: Secondary | ICD-10-CM | POA: Diagnosis not present

## 2023-09-23 DIAGNOSIS — M5416 Radiculopathy, lumbar region: Secondary | ICD-10-CM | POA: Diagnosis not present

## 2024-03-15 DIAGNOSIS — M5416 Radiculopathy, lumbar region: Secondary | ICD-10-CM | POA: Diagnosis not present

## 2024-03-15 DIAGNOSIS — M79601 Pain in right arm: Secondary | ICD-10-CM | POA: Diagnosis not present

## 2024-03-15 DIAGNOSIS — M47896 Other spondylosis, lumbar region: Secondary | ICD-10-CM | POA: Diagnosis not present
